# Patient Record
Sex: Female | Born: 1970 | Hispanic: No | Marital: Single | Smoking: Former smoker
Health system: Southern US, Community
[De-identification: ages and names within clinical notes are randomized; demographics above are authoritative.]

## PROBLEM LIST (undated history)

## (undated) DIAGNOSIS — E559 Vitamin D deficiency, unspecified: Secondary | ICD-10-CM

## (undated) DIAGNOSIS — F32A Depression, unspecified: Secondary | ICD-10-CM

## (undated) DIAGNOSIS — I10 Essential (primary) hypertension: Secondary | ICD-10-CM

## (undated) DIAGNOSIS — R232 Flushing: Secondary | ICD-10-CM

## (undated) DIAGNOSIS — G4700 Insomnia, unspecified: Secondary | ICD-10-CM

## (undated) DIAGNOSIS — G5600 Carpal tunnel syndrome, unspecified upper limb: Secondary | ICD-10-CM

## (undated) DIAGNOSIS — F329 Major depressive disorder, single episode, unspecified: Secondary | ICD-10-CM

## (undated) DIAGNOSIS — F419 Anxiety disorder, unspecified: Secondary | ICD-10-CM

## (undated) DIAGNOSIS — E119 Type 2 diabetes mellitus without complications: Secondary | ICD-10-CM

## (undated) HISTORY — DX: Depression, unspecified: F32.A

## (undated) HISTORY — DX: Major depressive disorder, single episode, unspecified: F32.9

## (undated) HISTORY — DX: Insomnia, unspecified: G47.00

## (undated) HISTORY — DX: Flushing: R23.2

## (undated) HISTORY — DX: Type 2 diabetes mellitus without complications: E11.9

## (undated) HISTORY — DX: Anxiety disorder, unspecified: F41.9

## (undated) HISTORY — DX: Carpal tunnel syndrome, unspecified upper limb: G56.00

---
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
## 1999-12-29 ENCOUNTER — Encounter: Payer: Self-pay | Admitting: *Deleted

## 1999-12-29 ENCOUNTER — Emergency Department (HOSPITAL_COMMUNITY): Admission: EM | Admit: 1999-12-29 | Discharge: 1999-12-29 | Payer: Self-pay | Admitting: *Deleted

## 2000-02-12 ENCOUNTER — Emergency Department (HOSPITAL_COMMUNITY): Admission: EM | Admit: 2000-02-12 | Discharge: 2000-02-12 | Payer: Self-pay | Admitting: Emergency Medicine

## 2000-08-18 ENCOUNTER — Encounter: Admission: RE | Admit: 2000-08-18 | Discharge: 2000-08-18 | Payer: Self-pay

## 2000-08-18 ENCOUNTER — Other Ambulatory Visit: Admission: RE | Admit: 2000-08-18 | Discharge: 2000-08-18 | Payer: Self-pay | Admitting: Gynecology

## 2000-12-22 ENCOUNTER — Inpatient Hospital Stay (HOSPITAL_COMMUNITY): Admission: AD | Admit: 2000-12-22 | Discharge: 2000-12-22 | Payer: Self-pay | Admitting: Obstetrics & Gynecology

## 2001-08-03 ENCOUNTER — Inpatient Hospital Stay (HOSPITAL_COMMUNITY): Admission: AD | Admit: 2001-08-03 | Discharge: 2001-08-03 | Payer: Self-pay | Admitting: *Deleted

## 2001-08-14 ENCOUNTER — Inpatient Hospital Stay: Admission: AD | Admit: 2001-08-14 | Discharge: 2001-08-14 | Payer: Self-pay | Admitting: *Deleted

## 2001-08-14 ENCOUNTER — Encounter: Payer: Self-pay | Admitting: *Deleted

## 2001-08-23 ENCOUNTER — Encounter: Admission: RE | Admit: 2001-08-23 | Discharge: 2001-08-23 | Payer: Self-pay | Admitting: Family Medicine

## 2001-10-09 ENCOUNTER — Encounter: Admission: RE | Admit: 2001-10-09 | Discharge: 2001-10-09 | Payer: Self-pay | Admitting: Family Medicine

## 2001-11-12 ENCOUNTER — Encounter: Admission: RE | Admit: 2001-11-12 | Discharge: 2001-11-12 | Payer: Self-pay | Admitting: Family Medicine

## 2001-11-14 ENCOUNTER — Ambulatory Visit (HOSPITAL_COMMUNITY): Admission: RE | Admit: 2001-11-14 | Discharge: 2001-11-14 | Payer: Self-pay | Admitting: *Deleted

## 2001-11-19 ENCOUNTER — Encounter: Admission: RE | Admit: 2001-11-19 | Discharge: 2001-11-19 | Payer: Self-pay | Admitting: Family Medicine

## 2001-11-29 ENCOUNTER — Encounter: Admission: RE | Admit: 2001-11-29 | Discharge: 2001-11-29 | Payer: Self-pay | Admitting: Family Medicine

## 2001-12-06 ENCOUNTER — Inpatient Hospital Stay (HOSPITAL_COMMUNITY): Admission: RE | Admit: 2001-12-06 | Discharge: 2001-12-06 | Payer: Self-pay | Admitting: Family Medicine

## 2001-12-08 ENCOUNTER — Inpatient Hospital Stay (HOSPITAL_COMMUNITY): Admission: AD | Admit: 2001-12-08 | Discharge: 2001-12-19 | Payer: Self-pay | Admitting: *Deleted

## 2001-12-11 ENCOUNTER — Encounter: Payer: Self-pay | Admitting: *Deleted

## 2001-12-27 ENCOUNTER — Encounter: Admission: RE | Admit: 2001-12-27 | Discharge: 2001-12-27 | Payer: Self-pay | Admitting: *Deleted

## 2002-01-10 ENCOUNTER — Encounter: Admission: RE | Admit: 2002-01-10 | Discharge: 2002-01-10 | Payer: Self-pay | Admitting: *Deleted

## 2002-01-17 ENCOUNTER — Encounter: Admission: RE | Admit: 2002-01-17 | Discharge: 2002-01-17 | Payer: Self-pay | Admitting: *Deleted

## 2002-01-24 ENCOUNTER — Encounter: Admission: RE | Admit: 2002-01-24 | Discharge: 2002-01-24 | Payer: Self-pay | Admitting: *Deleted

## 2002-01-31 ENCOUNTER — Encounter: Admission: RE | Admit: 2002-01-31 | Discharge: 2002-01-31 | Payer: Self-pay | Admitting: *Deleted

## 2002-02-07 ENCOUNTER — Encounter: Admission: RE | Admit: 2002-02-07 | Discharge: 2002-02-07 | Payer: Self-pay | Admitting: *Deleted

## 2002-02-12 ENCOUNTER — Ambulatory Visit (HOSPITAL_COMMUNITY): Admission: RE | Admit: 2002-02-12 | Discharge: 2002-02-12 | Payer: Self-pay | Admitting: *Deleted

## 2002-02-13 ENCOUNTER — Inpatient Hospital Stay (HOSPITAL_COMMUNITY): Admission: AD | Admit: 2002-02-13 | Discharge: 2002-02-13 | Payer: Self-pay | Admitting: Obstetrics and Gynecology

## 2002-02-14 ENCOUNTER — Encounter: Admission: RE | Admit: 2002-02-14 | Discharge: 2002-02-14 | Payer: Self-pay | Admitting: *Deleted

## 2002-02-16 ENCOUNTER — Inpatient Hospital Stay (HOSPITAL_COMMUNITY): Admission: AD | Admit: 2002-02-16 | Discharge: 2002-02-16 | Payer: Self-pay | Admitting: *Deleted

## 2002-02-21 ENCOUNTER — Encounter: Admission: RE | Admit: 2002-02-21 | Discharge: 2002-02-21 | Payer: Self-pay | Admitting: *Deleted

## 2002-02-22 ENCOUNTER — Encounter (HOSPITAL_COMMUNITY): Admission: RE | Admit: 2002-02-22 | Discharge: 2002-03-01 | Payer: Self-pay | Admitting: *Deleted

## 2002-03-06 ENCOUNTER — Encounter: Admission: RE | Admit: 2002-03-06 | Discharge: 2002-03-06 | Payer: Self-pay | Admitting: *Deleted

## 2002-03-07 ENCOUNTER — Inpatient Hospital Stay (HOSPITAL_COMMUNITY): Admission: AD | Admit: 2002-03-07 | Discharge: 2002-03-10 | Payer: Self-pay | Admitting: Obstetrics and Gynecology

## 2002-03-09 ENCOUNTER — Encounter (INDEPENDENT_AMBULATORY_CARE_PROVIDER_SITE_OTHER): Payer: Self-pay | Admitting: Specialist

## 2002-07-29 ENCOUNTER — Encounter (INDEPENDENT_AMBULATORY_CARE_PROVIDER_SITE_OTHER): Payer: Self-pay | Admitting: *Deleted

## 2002-07-29 LAB — CONVERTED CEMR LAB

## 2002-08-07 ENCOUNTER — Encounter: Admission: RE | Admit: 2002-08-07 | Discharge: 2002-08-07 | Payer: Self-pay | Admitting: Family Medicine

## 2006-01-26 ENCOUNTER — Emergency Department (HOSPITAL_COMMUNITY): Admission: EM | Admit: 2006-01-26 | Discharge: 2006-01-26 | Payer: Self-pay | Admitting: Emergency Medicine

## 2006-07-28 ENCOUNTER — Encounter (INDEPENDENT_AMBULATORY_CARE_PROVIDER_SITE_OTHER): Payer: Self-pay | Admitting: *Deleted

## 2006-10-13 ENCOUNTER — Emergency Department (HOSPITAL_COMMUNITY): Admission: EM | Admit: 2006-10-13 | Discharge: 2006-10-13 | Payer: Self-pay | Admitting: Emergency Medicine

## 2007-04-20 ENCOUNTER — Emergency Department (HOSPITAL_COMMUNITY): Admission: EM | Admit: 2007-04-20 | Discharge: 2007-04-20 | Payer: Self-pay | Admitting: Emergency Medicine

## 2007-12-15 ENCOUNTER — Emergency Department (HOSPITAL_COMMUNITY): Admission: EM | Admit: 2007-12-15 | Discharge: 2007-12-15 | Payer: Self-pay | Admitting: Emergency Medicine

## 2007-12-18 ENCOUNTER — Emergency Department (HOSPITAL_COMMUNITY): Admission: EM | Admit: 2007-12-18 | Discharge: 2007-12-18 | Payer: Self-pay | Admitting: Family Medicine

## 2008-08-18 ENCOUNTER — Ambulatory Visit: Payer: Self-pay | Admitting: Family Medicine

## 2008-08-19 ENCOUNTER — Other Ambulatory Visit: Admission: RE | Admit: 2008-08-19 | Discharge: 2008-08-19 | Payer: Self-pay | Admitting: Family Medicine

## 2008-08-19 ENCOUNTER — Ambulatory Visit: Payer: Self-pay | Admitting: Family Medicine

## 2008-08-19 ENCOUNTER — Encounter (INDEPENDENT_AMBULATORY_CARE_PROVIDER_SITE_OTHER): Payer: Self-pay | Admitting: Family Medicine

## 2008-08-19 LAB — CONVERTED CEMR LAB
Chlamydia, DNA Probe: NEGATIVE
GC Probe Amp, Genital: NEGATIVE

## 2008-08-20 ENCOUNTER — Ambulatory Visit: Payer: Self-pay | Admitting: Family Medicine

## 2008-08-20 DIAGNOSIS — F411 Generalized anxiety disorder: Secondary | ICD-10-CM | POA: Insufficient documentation

## 2008-08-20 LAB — CONVERTED CEMR LAB
Glucose, Urine, Semiquant: NEGATIVE
Ketones, urine, test strip: NEGATIVE
Specific Gravity, Urine: >=1.03
WBC Urine, dipstick: NEGATIVE
pH: 5.5

## 2008-08-22 ENCOUNTER — Encounter (INDEPENDENT_AMBULATORY_CARE_PROVIDER_SITE_OTHER): Payer: Self-pay | Admitting: Family Medicine

## 2008-08-25 ENCOUNTER — Encounter (INDEPENDENT_AMBULATORY_CARE_PROVIDER_SITE_OTHER): Payer: Self-pay | Admitting: Family Medicine

## 2008-08-25 ENCOUNTER — Ambulatory Visit: Payer: Self-pay | Admitting: Family Medicine

## 2008-08-25 LAB — CONVERTED CEMR LAB
ALT: 12 units/L (ref 0–35)
AST: 16 units/L (ref 0–37)
Albumin: 4 g/dL (ref 3.5–5.2)
Alkaline Phosphatase: 61 units/L (ref 39–117)
BUN: 10 mg/dL (ref 6–23)
CO2: 21 meq/L (ref 19–32)
Chloride: 107 meq/L (ref 96–112)
Creatinine, Ser: 0.78 mg/dL (ref 0.40–1.20)
Glucose, Bld: 116 mg/dL — ABNORMAL HIGH (ref 70–99)
HCT: 37.7 % (ref 36.0–46.0)
Hemoglobin: 12.8 g/dL (ref 12.0–15.0)
LDL Cholesterol: 102 mg/dL — ABNORMAL HIGH (ref 0–99)
MCV: 92.4 fL (ref 78.0–100.0)
Platelets: 256 10*3/uL (ref 150–400)
Total Bilirubin: 0.5 mg/dL (ref 0.3–1.2)
Total Protein: 6.9 g/dL (ref 6.0–8.3)
WBC: 5.6 10*3/uL (ref 4.0–10.5)

## 2008-08-29 ENCOUNTER — Encounter (INDEPENDENT_AMBULATORY_CARE_PROVIDER_SITE_OTHER): Payer: Self-pay | Admitting: Family Medicine

## 2008-12-24 ENCOUNTER — Telehealth: Payer: Self-pay | Admitting: Family Medicine

## 2008-12-26 ENCOUNTER — Telehealth: Payer: Self-pay | Admitting: *Deleted

## 2009-03-07 ENCOUNTER — Encounter (INDEPENDENT_AMBULATORY_CARE_PROVIDER_SITE_OTHER): Payer: Self-pay | Admitting: *Deleted

## 2009-03-07 DIAGNOSIS — F17200 Nicotine dependence, unspecified, uncomplicated: Secondary | ICD-10-CM

## 2009-08-10 ENCOUNTER — Telehealth: Payer: Self-pay | Admitting: *Deleted

## 2009-09-14 ENCOUNTER — Telehealth: Payer: Self-pay | Admitting: Family Medicine

## 2009-10-19 ENCOUNTER — Telehealth: Payer: Self-pay | Admitting: Family Medicine

## 2009-11-19 ENCOUNTER — Telehealth: Payer: Self-pay | Admitting: Family Medicine

## 2010-05-04 ENCOUNTER — Other Ambulatory Visit
Admission: RE | Admit: 2010-05-04 | Discharge: 2010-05-04 | Payer: Self-pay | Source: Home / Self Care | Admitting: Family Medicine

## 2010-05-04 ENCOUNTER — Encounter: Payer: Self-pay | Admitting: Family Medicine

## 2010-05-04 ENCOUNTER — Telehealth: Payer: Self-pay | Admitting: *Deleted

## 2010-05-04 ENCOUNTER — Ambulatory Visit: Payer: Self-pay | Admitting: Family Medicine

## 2010-05-04 LAB — CONVERTED CEMR LAB
ALT: 9 units/L (ref 0–35)
AST: 15 units/L (ref 0–37)
Albumin: 4.4 g/dL (ref 3.5–5.2)
Alkaline Phosphatase: 67 units/L (ref 39–117)
BUN: 10 mg/dL (ref 6–23)
Bilirubin Urine: NEGATIVE
CO2: 25 meq/L (ref 19–32)
Calcium: 9.4 mg/dL (ref 8.4–10.5)
Chlamydia, DNA Probe: NEGATIVE
Chloride: 105 meq/L (ref 96–112)
Cholesterol: 192 mg/dL (ref 0–200)
Creatinine, Ser: 0.7 mg/dL (ref 0.40–1.20)
GC Probe Amp, Genital: NEGATIVE
Glucose, Bld: 88 mg/dL (ref 70–99)
Glucose, Urine, Semiquant: NEGATIVE
HCT: 39.8 % (ref 36.0–46.0)
HDL: 66 mg/dL (ref >39)
HIV: NONREACTIVE
Hemoglobin: 13.1 g/dL (ref 12.0–15.0)
Ketones, urine, test strip: NEGATIVE
LDL Cholesterol: 108 mg/dL — ABNORMAL HIGH (ref 0–99)
MCHC: 32.9 g/dL (ref 30.0–36.0)
MCV: 93.6 fL (ref 78.0–100.0)
Nitrite: NEGATIVE
Pap Smear: NEGATIVE
Platelets: 283 10*3/uL (ref 150–400)
Potassium: 3.9 meq/L (ref 3.5–5.3)
Protein, U semiquant: NEGATIVE
RBC / HPF: >20
RBC: 4.25 M/uL (ref 3.87–5.11)
RDW: 14 % (ref 11.5–15.5)
Sodium: 139 meq/L (ref 135–145)
Specific Gravity, Urine: >=1.03
TSH: 0.848 microintl units/mL (ref 0.350–4.500)
Total Bilirubin: 0.4 mg/dL (ref 0.3–1.2)
Total CHOL/HDL Ratio: 2.9
Total Protein: 7.5 g/dL (ref 6.0–8.3)
Triglycerides: 91 mg/dL (ref <150)
Urobilinogen, UA: 0.2
VLDL: 18 mg/dL (ref 0–40)
WBC Urine, dipstick: NEGATIVE
WBC: 8.3 10*3/uL (ref 4.0–10.5)
Whiff Test: NEGATIVE
pH: 7

## 2010-05-05 ENCOUNTER — Encounter: Payer: Self-pay | Admitting: Family Medicine

## 2010-05-09 ENCOUNTER — Telehealth: Payer: Self-pay | Admitting: Family Medicine

## 2010-07-01 NOTE — Progress Notes (Signed)
Summary: Rx Req  Phone Note Refill Request Call back at Home Phone [phone] Message from:  Patient  Refills Requested: Medication #1:  PAXIL 10 MG TABS 1 tablet by mouth at bedtime [facility] [address]  Initial call taken by: Clydell Hakim,  August 10, 2009 4:43 PM  Follow-up for Phone Call        okay for 1 refill. needs appt. [facility] [address]. please send. Follow-up by: Helane Rima DO,  August 11, 2009 2:24 PM    Prescriptions: PAXIL 10 MG TABS (PAROXETINE HCL) 1 tablet by mouth at bedtime  #30 Tablet x 0   Entered by:   Tessie Fass CMA   Authorized by:   Helane Rima DO   Signed by:   Tessie Fass CMA on 08/11/2009   Method used:   [facility]        [phone][facility] (retail)       [address] [place], [place]  [address]       Ph: [phone]       Fax: [phone]   RxID:   [id number] PAXIL 10 MG TABS (PAROXETINE HCL) 1 tablet by mouth at bedtime  #30 Tablet x 1   Entered and Authorized by:   Helane Rima DO   Signed by:   Helane Rima DO on 08/11/2009   Method used:   Print then Give to Patient   RxID:   [id number]

## 2010-07-01 NOTE — Progress Notes (Signed)
Summary: re: RX/TS  Phone Note Refill Request Call back at [phone]   Refills Requested: Medication #1:  METRONIDAZOLE 500 MG TABS 1 tablet by mouth two times a day for 7 days. Pt did not receive hand written rx for this one nor was it Escribed to pharmcy.  [facility] [address]  Initial call taken by: Abundio Miu,  May 04, 2010 4:07 PM  Follow-up for Phone Call        Patient with NO infection on UA or wetprep. NO Rx written. The Flagyl was on her medication list from a previous Rx. I have now removed it from her medication list. NOTE: ALL of her other tests were also normal. Follow-up by: Helane Rima DO,  May 05, 2010 12:13 PM  Additional Follow-up for Phone Call Additional follow up Details #1::        called pt. informed of Dr.Wallace note. Additional Follow-up by: Arlyss Repress CMA,,  May 05, 2010 3:03 PM

## 2010-07-01 NOTE — Progress Notes (Signed)
Summary: Rx Req  Phone Note Refill Request Call back at Home Phone [phone] Message from:  Patient  Refills Requested: Medication #1:  PAXIL 10 MG TABS 1 tablet by mouth at bedtime NEEDS THEM RESENT IN DUE TO AT THE TIME UNABLE TO PAY FOR THEM.  Initial call taken by: Clydell Hakim,  November 19, 2009 9:49 AM  Follow-up for Phone Call        Needs appointment first. Has not been seen in over 1 year. Follow-up by: Helane Rima DO,  November 19, 2009 4:04 PM  Additional Follow-up for Phone Call Additional follow up Details #1::        called pt told her she needs appt for refills. she has upcoming appt on 12/16/09 with Earlene Plater, told her to keep that appt to get her Rx refilled. pt is requesting enough until appt. Additional Follow-up by: Tessie Fass CMA,  November 19, 2009 5:02 PM    Prescriptions: PAXIL 10 MG TABS (PAROXETINE HCL) 1 tablet by mouth at bedtime  #30 Tablet x 1   Entered and Authorized by:   Helane Rima DO   Signed by:   Helane Rima DO on 11/20/2009   Method used:   [facility]ronically to        [facility] [phone]* (retail)       [address] [place], [place]  [address]       Ph: [phone]       Fax: [phone]   RxID:   [id number]

## 2010-07-01 NOTE — Progress Notes (Signed)
Summary: Rx Req  Phone Note Refill Request Call back at Home Phone [phone] Message from:  Patient  Refills Requested: Medication #1:  PAXIL 10 MG TABS 1 tablet by mouth at bedtime [facility] [address].  Initial call taken by: Clydell Hakim,  December 24, 2008 1:56 PM  Follow-up for Phone Call        will forward message to MD. Follow-up by: Theresia Lo RN,  December 24, 2008 2:27 PM
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
## 2010-07-01 NOTE — Letter (Signed)
Summary: Generic Letter  [facility]  [address]   [place], [place] [address]   Phone: [phone]  Fax: [phone]    05/05/2010  GRACIEANN STANNARD [address] [address] [place], [place]  [address]  Dear Ms. Archibald,  I am happy to inform you that all of your recent labs were normal.  Sincerely,   Helane Rima DO  Appended Document: Generic Letter mailed

## 2010-07-01 NOTE — Progress Notes (Signed)
Summary: Rx Req  Phone Note Refill Request Call back at Home Phone [phone] Message from:  Patient  Refills Requested: Medication #1:  PAXIL 10 MG TABS 1 tablet by mouth at bedtime [facility] [address]  Initial call taken by: Clydell Hakim,  Oct 19, 2009 8:56 AM  Follow-up for Phone Call        must be seen before next refill Follow-up by: Helane Rima DO,  Oct 19, 2009 10:51 AM    Prescriptions: PAXIL 10 MG TABS (PAROXETINE HCL) 1 tablet by mouth at bedtime  #30 Tablet x 0   Entered and Authorized by:   Helane Rima DO   Signed by:   Helane Rima DO on 10/19/2009   Method used:   Electronically to        [facility] [address] (retail)       [phone]       [place], [place]  [address]       Ph: [phone]       Fax: [phone]   RxID:   [id number] PAXIL 10 MG TABS (PAROXETINE HCL) 1 tablet by mouth at bedtime  #30 Tablet x 0   Entered and Authorized by:   Helane Rima DO   Signed by:   Helane Rima DO on 10/19/2009   Method used:   [facility] [phone]* (retail)       [address] [place], [place]  [address]       Ph: [phone]       Fax: [phone]   RxID:   [id number]

## 2010-07-01 NOTE — Progress Notes (Signed)
Summary: UPDATE PROBLEM LIST   

## 2010-07-01 NOTE — Progress Notes (Signed)
Summary: resch  Phone Note Call from Patient   Caller: Patient Summary of Call: pt had to resch due to no transportation Initial call taken by: De Nurse,  September 14, 2009 9:01 AM

## 2010-07-05 ENCOUNTER — Encounter: Payer: Self-pay | Admitting: *Deleted
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
## 2010-10-15 NOTE — Op Note (Signed)
   [name], Theresa Olson                            ACCOUNT NO.:  [id number]   MEDICAL RECORD NO.:  [id number]                   PATIENT TYPE:  INP   LOCATION:  9139                                 FACILITY:  [facility]   PHYSICIAN:  Conni Elliot, M.D.             DATE OF BIRTH:  December 22, 1970   DATE OF PROCEDURE:  03/09/2002  DATE OF DISCHARGE:                                 OPERATIVE REPORT   PREOPERATIVE DIAGNOSIS:  Desire for surgical sterilization.   POSTOPERATIVE DIAGNOSIS:  Desire for surgical sterilization.   PROCEDURE:  Modified bilateral Pomeroy tubal ligation.   SURGEON:  Conni Elliot, M.D.   ANESTHESIA:  General endotracheal.   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   DESCRIPTION OF PROCEDURE:  After placing the patient under general  endotracheal anesthesia, the patient was prepped and draped in the sterile  fashion.  Periumbilical incision was made and incision was made in the skin  and subcutaneous fashion.  Peritoneal cavity entered.  The left fallopian  was identified and grasped with Babcock clamp and followed to the  fimbriated.  A single-tooth tenaculum was brought into the operative field,  double suture ligated and approximately 1.5-2.0 cm segment excised.  Hemostasis was adequate.  The same procedure was done on the opposite side.  Hemostasis was again adequate.  The anterior peritoneum, fascia and  subsequently the skin were then closed in a routine fashion.  Estimated  blood loss was less than 10 cc.                                               Conni Elliot, M.D.    ASG/MEDQ  D:  03/09/2002  T:  03/10/2002  Job:  [id number]

## 2011-02-25 LAB — CULTURE, ROUTINE-ABSCESS

## 2011-03-08 LAB — URINALYSIS, ROUTINE W REFLEX MICROSCOPIC
Bilirubin Urine: NEGATIVE
Glucose, UA: NEGATIVE
Ketones, ur: NEGATIVE
Nitrite: NEGATIVE
Protein, ur: NEGATIVE
Specific Gravity, Urine: 1.029
Urobilinogen, UA: 0.2
pH: 5.5

## 2011-03-08 LAB — RPR: RPR Ser Ql: NONREACTIVE

## 2011-03-08 LAB — GC/CHLAMYDIA PROBE AMP, GENITAL
Chlamydia, DNA Probe: NEGATIVE
GC Probe Amp, Genital: NEGATIVE

## 2011-03-08 LAB — WET PREP, GENITAL
Trich, Wet Prep: NONE SEEN
Yeast Wet Prep HPF POC: NONE SEEN

## 2011-03-08 LAB — PREGNANCY, URINE: Preg Test, Ur: NEGATIVE

## 2011-03-08 LAB — URINE MICROSCOPIC-ADD ON

## 2011-11-02 ENCOUNTER — Encounter: Payer: Self-pay | Admitting: Family Medicine

## 2011-11-02 ENCOUNTER — Ambulatory Visit (INDEPENDENT_AMBULATORY_CARE_PROVIDER_SITE_OTHER): Payer: Medicaid Other | Admitting: Family Medicine

## 2011-11-02 VITALS — BP 133/83 | HR 80 | Ht 61.75 in | Wt 172.0 lb

## 2011-11-02 DIAGNOSIS — F332 Major depressive disorder, recurrent severe without psychotic features: Secondary | ICD-10-CM | POA: Insufficient documentation

## 2011-11-02 LAB — TSH: TSH: 0.59 u[IU]/mL (ref 0.350–4.500)

## 2011-11-02 MED ORDER — SERTRALINE HCL 25 MG PO TABS: 25.0000 mg | ORAL_TABLET | Freq: Every day | ORAL | Status: DC

## 2011-11-02 NOTE — Patient Instructions (Signed)
Thank you for coming in today. Start taking the zoloft tonight.  Please come back in 1 week.  Call 911 or go to the ER if you feel like hurting yourself or others.  We will get you feeling better.

## 2011-11-03 ENCOUNTER — Encounter: Payer: Self-pay | Admitting: Family Medicine

## 2011-11-03 NOTE — Assessment & Plan Note (Signed)
Patient has severe major depression.  I am somewhat concerned about the possibility of bipolar depression and she has a pertinent family history. However her symptoms and mood disorder questionnaire would indicate unipolar depression. Plan to start Zoloft 25 mg as she has coexisting anxiety.  Plan to followup in one week and increase to 50 mg. Patient agreed to contract for safety. Suicidal precautions reviewed

## 2011-11-03 NOTE — Progress Notes (Signed)
Theresa Olson is a 41 y.o. female who presents to [facility] today for depression.  Patient has a long history of major depression. She had been on Paxil in the past. In the intervening years since her last visit she had stopped taking Paxil and her depression has returned.  Her PHQ 9 today is 25.  She has a passive death wish but no plan.  She has no access to guns.    Patient notes significantly bothersome anhedonia.  Review of family history shows a sister and her sister's children with bipolar.  Patient denies any personal diagnosis of bipolar.  Mood disorder questionnaire is 8   PMH: Reviewed significant for unipolar depression History  Substance Use Topics  . Smoking status: Current Some Day Smoker    Types: Cigarettes  . Smokeless tobacco: Not on file  . Alcohol Use: Not on file   ROS as above  Medications reviewed. Current Outpatient Prescriptions  Medication Sig Dispense Refill  . sertraline (ZOLOFT) 25 MG tablet Take 1 tablet (25 mg total) by mouth daily.  30 tablet  0    Exam:  BP 133/83  Pulse 80  Ht 5' 1.75" (1.568 m)  Wt 172 lb (78.019 kg)  BMI 31.71 kg/m2 Gen: Well NAD PSYCH: Alert and oriented. Normal eye contact. Tearful at times. Flattened affect.  Normal speech and concentration. No SI/HI hallucinations or delusions expressed  Results for orders placed in visit on 11/02/11 (from the past 72 hour(s))  TSH     Status: Normal   Collection Time   11/02/11  4:49 PM      Component Value Range Comment   TSH 0.590  0.350 - 4.500 (uIU/mL)

## 2011-11-09 ENCOUNTER — Ambulatory Visit: Payer: Medicaid Other | Admitting: Family Medicine

## 2011-11-15 ENCOUNTER — Ambulatory Visit: Payer: Medicaid Other | Admitting: Family Medicine

## 2011-12-05 ENCOUNTER — Telehealth: Payer: Self-pay | Admitting: Family Medicine

## 2011-12-05 NOTE — Telephone Encounter (Signed)
Patient is calling for a refill on Zoloft to be sent to [facility] on [address].

## 2011-12-06 ENCOUNTER — Other Ambulatory Visit: Payer: Self-pay | Admitting: Family Medicine

## 2011-12-06 DIAGNOSIS — F332 Major depressive disorder, recurrent severe without psychotic features: Secondary | ICD-10-CM

## 2011-12-06 MED ORDER — SERTRALINE HCL 25 MG PO TABS: 25.0000 mg | ORAL_TABLET | Freq: Every day | ORAL | Status: DC

## 2011-12-06 NOTE — Telephone Encounter (Signed)
Refilled zoloft 25mg  daily and sent to [facility] pharmacy

## 2012-04-23 ENCOUNTER — Telehealth: Payer: Self-pay | Admitting: Family Medicine

## 2012-04-23 NOTE — Telephone Encounter (Signed)
Called patient about her thinking she is pregnant while on paxil. She reports that she actually has not been taking any medicine for her depression. She was prescribed zoloft at her last appointment but has not been taking it recently. She is not taking paxil either. She also reports that she has been having bleeding that feels like a period and that her last period was 2-3 weeks ago. She states that she doesn't have regular periods and that it is not unusual for her to have a period within 2-3 weeks of each other. The reason she thought she might be pregnant was because she thought her belly looked a little bigger.   I told her that the likelihood of her being pregnant is low since this sounds like her regular period pattern, however it cannot be completely excluded. Recommended for her to take a home pregnancy test and if it is positive for her to go to the clinic tomorrow to be evaluated for bleeding in early pregnancy. If it isn't positive, we will confirm that she is indeed not pregnant at her visit on Monday.  In addition, I recommended that it would be safe to start back zoloft but no paxil if there is a possibility that she might be pregnant.   Patient expressed understanding and agreed to this.

## 2012-04-23 NOTE — Telephone Encounter (Signed)
Patient has an appt on 04/30/12 (Monday).  Started having bleeding yesterday.  Not using birth control and has had unprotected intercourse.  LMP was possibly 2-3 weeks ago.  Not sure if she is pregnant.  Has not had home pregnancy test.  Wants to know if she should continue on Paxil until her appt on Monday.  Will route note to Dr. Gwenlyn Saran for advice and call patient back.  Gaylene Brooks, RN

## 2012-04-23 NOTE — Telephone Encounter (Signed)
Patient doesn't know if she should continue to take Paxil if she might be pregnant.  She is scheduled to be seen next week.

## 2012-04-30 ENCOUNTER — Encounter: Payer: Medicaid Other | Admitting: Family Medicine

## 2012-09-21 ENCOUNTER — Ambulatory Visit: Payer: Medicaid Other | Admitting: Family Medicine

## 2013-05-15 ENCOUNTER — Emergency Department (HOSPITAL_COMMUNITY)
Admission: EM | Admit: 2013-05-15 | Discharge: 2013-05-15 | Disposition: A | Discharge status: Home / Self Care | Payer: No Typology Code available for payment source | Attending: Emergency Medicine | Admitting: Emergency Medicine

## 2013-05-15 ENCOUNTER — Emergency Department (HOSPITAL_COMMUNITY): Payer: No Typology Code available for payment source

## 2013-05-15 ENCOUNTER — Encounter (HOSPITAL_COMMUNITY): Payer: Self-pay | Admitting: Emergency Medicine

## 2013-05-15 DIAGNOSIS — F419 Anxiety disorder, unspecified: Secondary | ICD-10-CM

## 2013-05-15 DIAGNOSIS — S335XXA Sprain of ligaments of lumbar spine, initial encounter: Secondary | ICD-10-CM | POA: Insufficient documentation

## 2013-05-15 DIAGNOSIS — S39012A Strain of muscle, fascia and tendon of lower back, initial encounter: Secondary | ICD-10-CM

## 2013-05-15 DIAGNOSIS — I10 Essential (primary) hypertension: Secondary | ICD-10-CM | POA: Insufficient documentation

## 2013-05-15 DIAGNOSIS — Y9389 Activity, other specified: Secondary | ICD-10-CM | POA: Insufficient documentation

## 2013-05-15 DIAGNOSIS — F411 Generalized anxiety disorder: Secondary | ICD-10-CM | POA: Insufficient documentation

## 2013-05-15 DIAGNOSIS — F17200 Nicotine dependence, unspecified, uncomplicated: Secondary | ICD-10-CM | POA: Insufficient documentation

## 2013-05-15 DIAGNOSIS — R000 Tachycardia, unspecified: Secondary | ICD-10-CM | POA: Insufficient documentation

## 2013-05-15 DIAGNOSIS — S0993XA Unspecified injury of face, initial encounter: Secondary | ICD-10-CM | POA: Insufficient documentation

## 2013-05-15 DIAGNOSIS — Z79899 Other long term (current) drug therapy: Secondary | ICD-10-CM | POA: Insufficient documentation

## 2013-05-15 DIAGNOSIS — Y92410 Unspecified street and highway as the place of occurrence of the external cause: Secondary | ICD-10-CM | POA: Insufficient documentation

## 2013-05-15 HISTORY — DX: Essential (primary) hypertension: I10

## 2013-05-15 MED ORDER — NAPROXEN 500 MG PO TABS: 500.0000 mg | ORAL_TABLET | Freq: Two times a day (BID) | ORAL | Status: DC | PRN

## 2013-05-15 MED ORDER — IBUPROFEN 800 MG PO TABS
800.0000 mg | ORAL_TABLET | Freq: Once | ORAL | Status: AC
  Administered 2013-05-15: 800 mg via ORAL
  Filled 2013-05-15: qty 1

## 2013-05-15 MED ORDER — METHOCARBAMOL 750 MG PO TABS: 750.0000 mg | ORAL_TABLET | Freq: Four times a day (QID) | ORAL | Status: DC | PRN

## 2013-05-15 MED ORDER — HYDROCODONE-ACETAMINOPHEN 5-325 MG PO TABS: 1.0000 | ORAL_TABLET | Freq: Four times a day (QID) | ORAL | Status: DC | PRN

## 2013-05-15 MED ORDER — DIAZEPAM 5 MG PO TABS
10.0000 mg | ORAL_TABLET | Freq: Once | ORAL | Status: AC
  Administered 2013-05-15: 10 mg via ORAL
  Filled 2013-05-15: qty 2

## 2013-05-15 MED ORDER — HYDROCODONE-ACETAMINOPHEN 5-325 MG PO TABS
1.0000 | ORAL_TABLET | Freq: Once | ORAL | Status: AC
  Administered 2013-05-15: 1 via ORAL
  Filled 2013-05-15: qty 1

## 2013-05-15 MED ORDER — LORAZEPAM 1 MG PO TABS
1.0000 mg | ORAL_TABLET | Freq: Once | ORAL | Status: AC
  Administered 2013-05-15: 1 mg via ORAL
  Filled 2013-05-15: qty 1
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
## 2013-05-17 NOTE — ED Provider Notes (Signed)
Medical screening examination/treatment/procedure(s) were performed by non-physician practitioner and as supervising physician I was immediately available for consultation/collaboration.  EKG Interpretation   None         Audree Camel, MD 05/17/13 [phone]

## 2013-06-04 ENCOUNTER — Ambulatory Visit (INDEPENDENT_AMBULATORY_CARE_PROVIDER_SITE_OTHER): Payer: Medicaid Other | Admitting: Family Medicine

## 2013-06-04 ENCOUNTER — Other Ambulatory Visit (HOSPITAL_COMMUNITY)
Admission: RE | Admit: 2013-06-04 | Discharge: 2013-06-04 | Disposition: A | Discharge status: Home / Self Care | Payer: Medicaid Other | Source: Ambulatory Visit | Attending: Family Medicine | Admitting: Family Medicine

## 2013-06-04 ENCOUNTER — Encounter: Payer: Self-pay | Admitting: Family Medicine

## 2013-06-04 VITALS — BP 128/88 | HR 89 | Temp 99.1°F | Ht 62.0 in | Wt 171.0 lb

## 2013-06-04 DIAGNOSIS — F332 Major depressive disorder, recurrent severe without psychotic features: Secondary | ICD-10-CM

## 2013-06-04 DIAGNOSIS — R300 Dysuria: Secondary | ICD-10-CM

## 2013-06-04 DIAGNOSIS — N760 Acute vaginitis: Secondary | ICD-10-CM

## 2013-06-04 DIAGNOSIS — Z113 Encounter for screening for infections with a predominantly sexual mode of transmission: Secondary | ICD-10-CM | POA: Insufficient documentation

## 2013-06-04 DIAGNOSIS — Z1151 Encounter for screening for human papillomavirus (HPV): Secondary | ICD-10-CM | POA: Insufficient documentation

## 2013-06-04 DIAGNOSIS — Z124 Encounter for screening for malignant neoplasm of cervix: Secondary | ICD-10-CM

## 2013-06-04 DIAGNOSIS — Z01419 Encounter for gynecological examination (general) (routine) without abnormal findings: Secondary | ICD-10-CM | POA: Insufficient documentation

## 2013-06-04 LAB — POCT URINALYSIS DIPSTICK
BILIRUBIN UA: NEGATIVE
GLUCOSE UA: NEGATIVE
Ketones, UA: NEGATIVE
NITRITE UA: NEGATIVE
PH UA: 6
Protein, UA: 30
Spec Grav, UA: >=1.03
Urobilinogen, UA: 0.2

## 2013-06-04 LAB — POCT UA - MICROSCOPIC ONLY

## 2013-06-04 LAB — POCT WET PREP (WET MOUNT)
Clue Cells Wet Prep Whiff POC: POSITIVE
WBC, Wet Prep HPF POC: >20

## 2013-06-04 LAB — POCT URINE PREGNANCY: Preg Test, Ur: NEGATIVE

## 2013-06-04 MED ORDER — PAROXETINE HCL 20 MG PO TABS: 20.0000 mg | ORAL_TABLET | Freq: Every morning | ORAL | Status: DC

## 2013-06-04 MED ORDER — METRONIDAZOLE 500 MG PO TABS: 500.0000 mg | ORAL_TABLET | Freq: Two times a day (BID) | ORAL | Status: DC
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
## 2013-06-04 NOTE — Patient Instructions (Addendum)
We are going to start you back on the Paxil 20 mg daily. I would like to see you back in 2 weeks. I also recommend that you call her psychologist Dr. Pascal LuxKane to discuss your anxiety and depression further. I recommend that you meet with our psychologist, Dr. Spero GeraldsMichelle Kane, for help dealing with your depression and anxiety.  You can schedule an appointment with her by calling her directly at [phone].  I will let you know of the results if they are abnormal.  Please return to the clinic for a lab appointment only. You can make the appointment by calling a couple of days prior to the day you would like to get your labs drawn.  Please come fasting for the labwork: no food or drink other than water after midnight. We will check your thyroid, your cholesterol, and your complete metabolic panel.  We are going to treat the vaginal infection with a medicine called Flagyl

## 2013-06-05 ENCOUNTER — Encounter: Payer: Self-pay | Admitting: Family Medicine

## 2013-06-05 DIAGNOSIS — N760 Acute vaginitis: Secondary | ICD-10-CM | POA: Insufficient documentation

## 2013-06-05 NOTE — Assessment & Plan Note (Signed)
Long standing history of depression. Was on paxil in the past which did seem to help. She seems to have been on 20mg  at the time. There is a note from her previous doctor Dr. Denyse Amassorey mentioning possibility of bipolar. Since patient tolerated paxil well in the past, would like to try it and see if this helps.  At next visit, will give her a MDQ to fill out.  - refill paxil 20mg   - gave number for Dr. Pascal LuxKane. Patient is interested in this - no SI/HI at this time - substance abuse is a factor which would be nice to be addressed with Dr. Pascal LuxKane - follow up with me in 10 days to 2 weeks

## 2013-06-05 NOTE — Assessment & Plan Note (Signed)
Associated with abdominal pain.  Wet prep obtained. UA showing evidence of BV and trich.  - treat BV and trich with flagyl. If patient doesn't tolerate due to nausea, advised her to call and I will prescribe zofran - GC/Chl done - PAP smear done since she was due for one - UA non conclusive and sent to culture

## 2013-06-06 ENCOUNTER — Telehealth: Payer: Self-pay | Admitting: Family Medicine

## 2013-06-06 ENCOUNTER — Encounter: Payer: Self-pay | Admitting: Family Medicine

## 2013-06-06 LAB — URINE CULTURE
COLONY COUNT: NO GROWTH
ORGANISM ID, BACTERIA: NO GROWTH

## 2013-06-06 NOTE — Telephone Encounter (Signed)
Called pt. Informed. .Theresa Olson  

## 2013-06-06 NOTE — Telephone Encounter (Signed)
Please let patient know that her urine culture did not show any urinary tract infection. Thank you!  Marena ChancyStephanie Maryalyce Sanjuan, PGY-3 Family Medicine Resident

## 2013-06-10 ENCOUNTER — Other Ambulatory Visit: Payer: Medicaid Other

## 2013-06-18 ENCOUNTER — Ambulatory Visit: Payer: Medicaid Other | Admitting: Family Medicine

## 2014-02-17 ENCOUNTER — Other Ambulatory Visit (HOSPITAL_COMMUNITY)
Admission: RE | Admit: 2014-02-17 | Discharge: 2014-02-17 | Disposition: A | Discharge status: Home / Self Care | Payer: Medicaid Other | Source: Ambulatory Visit | Attending: Family Medicine | Admitting: Family Medicine

## 2014-02-17 ENCOUNTER — Ambulatory Visit (INDEPENDENT_AMBULATORY_CARE_PROVIDER_SITE_OTHER): Payer: Medicaid Other | Admitting: Family Medicine

## 2014-02-17 ENCOUNTER — Encounter: Payer: Self-pay | Admitting: Family Medicine

## 2014-02-17 VITALS — BP 170/80 | HR 93 | Temp 98.7°F | Ht 62.0 in | Wt 169.6 lb

## 2014-02-17 DIAGNOSIS — Z113 Encounter for screening for infections with a predominantly sexual mode of transmission: Secondary | ICD-10-CM | POA: Diagnosis not present

## 2014-02-17 DIAGNOSIS — Z23 Encounter for immunization: Secondary | ICD-10-CM

## 2014-02-17 DIAGNOSIS — F17200 Nicotine dependence, unspecified, uncomplicated: Secondary | ICD-10-CM

## 2014-02-17 DIAGNOSIS — F411 Generalized anxiety disorder: Secondary | ICD-10-CM

## 2014-02-17 DIAGNOSIS — N898 Other specified noninflammatory disorders of vagina: Secondary | ICD-10-CM

## 2014-02-17 DIAGNOSIS — N760 Acute vaginitis: Secondary | ICD-10-CM

## 2014-02-17 LAB — POCT WET PREP (WET MOUNT): Clue Cells Wet Prep Whiff POC: POSITIVE

## 2014-02-17 MED ORDER — METRONIDAZOLE 500 MG PO TABS: 500.0000 mg | ORAL_TABLET | Freq: Two times a day (BID) | ORAL | Status: DC

## 2014-02-17 MED ORDER — PAROXETINE HCL 20 MG PO TABS: 20.0000 mg | ORAL_TABLET | Freq: Every morning | ORAL | Status: DC

## 2014-02-17 NOTE — Assessment & Plan Note (Signed)
Patient currently smokes 4-5 cigs/day. States she feels she smokes because of her anxiety. Will attempt to control anxiety. If she cannot quit once her anxiety has improved, we will proceed with alternatives such as nicotine replacement and/or Wellbutrin (as it may also help with depression).

## 2014-02-17 NOTE — Patient Instructions (Addendum)
It was nice meeting you. The prescription for flagyl and paxil have been sent to your pharmacy  I will call you with results Please follow up with me in 1 month  Bacterial Vaginosis Bacterial vaginosis is a vaginal infection that occurs when the normal balance of bacteria in the vagina is disrupted. It results from an overgrowth of certain bacteria. This is the most common vaginal infection in women of childbearing age. Treatment is important to prevent complications, especially in pregnant women, as it can cause a premature delivery. CAUSES  Bacterial vaginosis is caused by an increase in harmful bacteria that are normally present in smaller amounts in the vagina. Several different kinds of bacteria can cause bacterial vaginosis. However, the reason that the condition develops is not fully understood. RISK FACTORS Certain activities or behaviors can put you at an increased risk of developing bacterial vaginosis, including:  Having a new sex partner or multiple sex partners.  Douching.  Using an intrauterine device (IUD) for contraception. Women do not get bacterial vaginosis from toilet seats, bedding, swimming pools, or contact with objects around them. SIGNS AND SYMPTOMS  Some women with bacterial vaginosis have no signs or symptoms. Common symptoms include:  Grey vaginal discharge.  A fishlike odor with discharge, especially after sexual intercourse.  Itching or burning of the vagina and vulva.  Burning or pain with urination. DIAGNOSIS  Your health care provider will take a medical history and examine the vagina for signs of bacterial vaginosis. A sample of vaginal fluid may be taken. Your health care provider will look at this sample under a microscope to check for bacteria and abnormal cells. A vaginal pH test may also be done.  TREATMENT  Bacterial vaginosis may be treated with antibiotic medicines. These may be given in the form of a pill or a vaginal cream. A second round of  antibiotics may be prescribed if the condition comes back after treatment.  HOME CARE INSTRUCTIONS   Only take over-the-counter or prescription medicines as directed by your health care provider.  If antibiotic medicine was prescribed, take it as directed. Make sure you finish it even if you start to feel better.  Do not have sex until treatment is completed.  Tell all sexual partners that you have a vaginal infection. They should see their health care provider and be treated if they have problems, such as a mild rash or itching.  Practice safe sex by using condoms and only having one sex partner. SEEK MEDICAL CARE IF:   Your symptoms are not improving after 3 days of treatment.  You have increased discharge or pain.  You have a fever. MAKE SURE YOU:   Understand these instructions.  Will watch your condition.  Will get help right away if you are not doing well or get worse. FOR MORE INFORMATION  Centers for Disease Control and Prevention, Division of STD Prevention: [employer]za American Sexual Health Association (ASHA): www.ashastd.org  Document Released: 05/16/2005 Document Revised: 03/06/2013 Document Reviewed: 12/26/2012 [facility] Patient Information 2015 [place], [place]. This information is not intended to replace advice given to you by your health care provider. Make sure you discuss any questions you have with your health care provider.

## 2014-02-17 NOTE — Assessment & Plan Note (Signed)
Wet prep positive for clue cells. This may be causing the patient's pelvic pain. Given h/o unprotected sex with multiple partners, patient would benefit from additional screening.  - Metronidazole  BID x 7 days: discussed disulfiram reaction with patient  - GC/Chlamydia pending  - HIV, RPR, hepatitis panel ordered and pending

## 2014-02-17 NOTE — Assessment & Plan Note (Signed)
From reviewing EMR, patient's GAD score and PHQ-9 scores did not change much while on this medication, but she felt she did much better. No SI/HI.   -Paxil  QD -Discussed meeting with Dr. Pascal Lux at mood disorder clinic - Will f/u in 1 month to assess change.

## 2014-02-17 NOTE — Progress Notes (Signed)
Patient ID: Theresa Olson, female   DOB: 26-Jul-1970, 43 y.o.   MRN: [id number] [facility]  Joanna Puff, MD  Subjective:  Anxiety/Depression: Used to be on Paxil  daily, however did not follow up so she hasn't been taking anything. Patient very anxious all the time. Worries about everything. Difficulty falling asleep and staying asleep. Hyperphagia. Endorses feelings of hopelessness. No recent stressors. No SI/HI or hallucinations.  Did feel Paxil helped in the past.  GAD 21, extremely difficult  PHQ-9: 25  Vaginal discharge: Has noted it for the last 3 months.Has had some dark brown discharge- she does not feel this is old blood after a menses. Also endorses white discharge on occasion which is abnormal for her. Discharge does not have a foul odor. +Suprapubic sharp pains. No dysuria, urinary urgency, or urinary frequency. No vaginal pruritus. Had +trichomonas and BV in 05/2013 and states she was treated. No sexual intercourse with that partner since then but notes she's had unprotected intercourse with 2 other men since that time. Has an irregular menstrual cycle. Denies excessive vaginal bleeding.  ROS- No chest pain, SOB, headaches, blurred vision.   Past Medical History Patient Active Problem List   Diagnosis Date Noted  . Vaginitis and vulvovaginitis 06/05/2013  . Recurrent major depression-severe 11/02/2011  . TOBACCO USER 03/07/2009  . ANXIETY DISORDER, GENERALIZED 08/20/2008    Medications- reviewed and updated Current Outpatient Prescriptions  Medication Sig Dispense Refill  . metroNIDAZOLE (FLAGYL) 500 MG tablet Take 1 tablet (500 mg total) by mouth 2 (two) times daily.  14 tablet  0  . vitamin C (ASCORBIC ACID) 500 MG tablet Take 500 mg by mouth every morning.       No current facility-administered medications for this visit.    Objective: BP 170/80  Pulse 93  Temp(Src) 98.7 F (37.1 C) (Oral)  Ht  (1.575 m)  Wt 169 lb 9.6 oz (76.93 kg)   BMI 31.01 kg/m2 Rechecked BP: 146/76 Gen: No acute distress. Alert, cooperative with exam. Appears anxious. Poor eye contact and frequently fidgeting HEENT: Atraumatic,Oropharynx clear. MMM CV: RRR. No murmurs, rubs, or gallops noted. 2+ radial and DP pulses bilaterally. Resp: CTAB. No wheezing, crackles, or rhonchi noted. Abd: +BS. Soft, non-distended, non-tender. Specifically, no pain with palpation over the suprapubic area Speculum exam: No irritation of the vulva. Moderate amount of thin white vaginal discharge, malodorous in nature. No cervical motion tenderness.  Ext: No edema. No gross deformities. Neuro: Alert and oriented, No gross focal deficits   Assessment/Plan:  Vaginitis and vulvovaginitis Wet prep positive for clue cells. This may be causing the patient's pelvic pain. Given h/o unprotected sex with multiple partners, patient would benefit from additional screening.  - Metronidazole  BID x 7 days: discussed disulfiram reaction with patient  - GC/Chlamydia pending  - HIV, RPR, hepatitis panel ordered and pending   ANXIETY DISORDER, GENERALIZED From reviewing EMR, patient's GAD score and PHQ-9 scores did not change much while on this medication, but she felt she did much better. No SI/HI.   -Paxil  QD -Discussed meeting with Dr. Pascal Lux at mood disorder clinic - Will f/u in 1 month to assess change.  TOBACCO USER Patient currently smokes 4-5 cigs/day. States she feels she smokes because of her anxiety. Will attempt to control anxiety. If she cannot quit once her anxiety has improved, we will proceed with alternatives such as nicotine replacement and/or Wellbutrin (as it may also help with depression).     Orders  Placed This Encounter  Procedures  . HIV antibody (with reflex)  . RPR  . Hepatitis panel, acute  . POCT Wet Prep [facility])    Meds ordered this encounter  Medications  . metroNIDAZOLE (FLAGYL) 500 MG tablet    Sig: Take 1 tablet (500 mg total)  by mouth 2 (two) times daily.    Dispense:  14 tablet    Refill:  0  . DISCONTD: PARoxetine (PAXIL) 20 MG tablet    Sig: Take 1 tablet (20 mg total) by mouth every morning.    Dispense:  30 tablet    Refill:  1

## 2014-02-18 LAB — CERVICOVAGINAL ANCILLARY ONLY
Chlamydia: NEGATIVE
NEISSERIA GONORRHEA: NEGATIVE

## 2014-02-18 LAB — RPR

## 2014-02-18 LAB — HEPATITIS PANEL, ACUTE
HCV AB: NEGATIVE
Hep A IgM: NONREACTIVE
Hep B C IgM: NONREACTIVE
Hepatitis B Surface Ag: NEGATIVE

## 2014-02-18 LAB — HIV ANTIBODY (ROUTINE TESTING W REFLEX): HIV: NONREACTIVE

## 2014-02-20 ENCOUNTER — Telehealth: Payer: Self-pay | Admitting: Family Medicine

## 2014-02-20 NOTE — Telephone Encounter (Signed)
Will you please call and let the patient know that all of her labs were normal. The home phone number that I tried (after work hours) states it is not in service.   Thank you, Rodrigo Ran

## 2014-02-21 ENCOUNTER — Encounter: Payer: Self-pay | Admitting: *Deleted

## 2014-02-21 NOTE — Telephone Encounter (Signed)
Letter sent to patient address.

## 2014-03-11 ENCOUNTER — Telehealth: Payer: Self-pay | Admitting: Family Medicine

## 2014-03-11 NOTE — Telephone Encounter (Signed)
Refill request for naproxen. Pls call once completed.

## 2014-03-12 MED ORDER — NAPROXEN 500 MG PO TABS: 500.0000 mg | ORAL_TABLET | Freq: Two times a day (BID) | ORAL | Status: AC | PRN

## 2014-03-12 NOTE — Telephone Encounter (Signed)
Naproxen Rx refilled. If patient continues to have pain, please have her make an appointment with me.  Thanks, Joanna Puffrystal S. Dorsey, MD [facility] Resident  03/12/2014, 4:14 PM

## 2014-03-13 NOTE — Telephone Encounter (Signed)
Tried calling patient, number currently disconnected.

## 2014-03-13 NOTE — Telephone Encounter (Signed)
PT also requesting Paxil to be refilled.

## 2014-03-14 MED ORDER — PAROXETINE HCL 20 MG PO TABS: 20.0000 mg | ORAL_TABLET | Freq: Every day | ORAL | Status: DC

## 2014-03-14 NOTE — Telephone Encounter (Signed)
Rx refilled. Please have pt make a f/u appt with me.  Thanks, Joanna Puffrystal S. Dorsey, MD [facility] Res[facility]  03/14/2014, 3:18 PM

## 2014-03-14 NOTE — Addendum Note (Signed)
Addended by: Joanna PuffRSEY, CRYSTAL S on: 03/14/2014 03:18 PM   Modules accepted: Orders

## 2014-03-18 NOTE — Telephone Encounter (Signed)
Left message asking that patient call back to scheduled follow up.

## 2014-04-16 ENCOUNTER — Other Ambulatory Visit: Payer: Self-pay | Admitting: Family Medicine

## 2014-04-16 MED ORDER — PAROXETINE HCL 20 MG PO TABS: 20.0000 mg | ORAL_TABLET | Freq: Every day | ORAL | Status: DC

## 2014-04-16 NOTE — Telephone Encounter (Signed)
Pt called and needs a refill on her Paxil called in. jw

## 2014-04-16 NOTE — Telephone Encounter (Signed)
Please let the patient know that her Rx was refilled. Please have her follow up with me within the next month so we can assess its effectiveness.  Thanks, Joanna Puffrystal S. Dorsey, MD [facility] Res[facility]  04/16/2014, 9:24 PM

## 2014-04-29 NOTE — Telephone Encounter (Signed)
Phone number not currently in service.

## 2014-12-04 ENCOUNTER — Other Ambulatory Visit: Payer: Self-pay | Admitting: Family Medicine

## 2014-12-05 NOTE — Telephone Encounter (Signed)
Please ask the patient to come in for an appt prior to any refills, as she should've followed up with me in October.  Thanks, Theresa Puffrystal S. [name], MD [facility] Res[facility]  12/05/2014, 11:37 AM

## 2014-12-27 ENCOUNTER — Other Ambulatory Visit: Payer: Self-pay | Admitting: Family Medicine

## 2014-12-30 NOTE — Telephone Encounter (Signed)
Rx refilled x 2 months. Please have the patient make an appt with me.  Thanks, Joanna Puff, MD [facility] Resident

## 2015-02-28 ENCOUNTER — Other Ambulatory Visit: Payer: Self-pay | Admitting: Family Medicine

## 2015-03-02 NOTE — Telephone Encounter (Signed)
Paxil refill x 2 months. Please let the patient know I will not refill this again without her seeing me.  Thanks, Joanna Puff, MD [facility] Resident  03/02/2015, 1:38 PM

## 2015-03-03 NOTE — Telephone Encounter (Signed)
Left message with patient daughter to have her call back and schedule an appointment.

## 2015-03-27 ENCOUNTER — Ambulatory Visit: Payer: Medicaid Other | Admitting: Family Medicine

## 2015-04-22 ENCOUNTER — Ambulatory Visit: Payer: Medicaid Other | Admitting: Family Medicine

## 2015-05-13 ENCOUNTER — Other Ambulatory Visit: Payer: Self-pay | Admitting: Family Medicine

## 2015-05-13 MED ORDER — PAROXETINE HCL 20 MG PO TABS: 20.0000 mg | ORAL_TABLET | Freq: Every day | ORAL | Status: DC

## 2015-05-13 NOTE — Telephone Encounter (Signed)
Pt called and needs a refill on PARoxetine (PAXIL) 20 MG tablet Thank you, Sadie Reynolds, ASA

## 2015-06-10 ENCOUNTER — Other Ambulatory Visit (HOSPITAL_COMMUNITY)
Admission: RE | Admit: 2015-06-10 | Discharge: 2015-06-10 | Disposition: A | Discharge status: Home / Self Care | Payer: Medicaid Other | Source: Ambulatory Visit | Attending: Family Medicine | Admitting: Family Medicine

## 2015-06-10 ENCOUNTER — Ambulatory Visit (INDEPENDENT_AMBULATORY_CARE_PROVIDER_SITE_OTHER): Payer: Medicaid Other | Admitting: Family Medicine

## 2015-06-10 VITALS — BP 123/85 | HR 85 | Temp 98.5°F | Ht 61.0 in | Wt 168.2 lb

## 2015-06-10 DIAGNOSIS — R300 Dysuria: Secondary | ICD-10-CM | POA: Diagnosis not present

## 2015-06-10 DIAGNOSIS — N760 Acute vaginitis: Secondary | ICD-10-CM | POA: Diagnosis not present

## 2015-06-10 DIAGNOSIS — Z113 Encounter for screening for infections with a predominantly sexual mode of transmission: Secondary | ICD-10-CM | POA: Diagnosis present

## 2015-06-10 DIAGNOSIS — Z23 Encounter for immunization: Secondary | ICD-10-CM | POA: Diagnosis not present

## 2015-06-10 LAB — POCT URINALYSIS DIPSTICK
Bilirubin, UA: NEGATIVE
Glucose, UA: NEGATIVE
Ketones, UA: NEGATIVE
Leukocytes, UA: NEGATIVE
NITRITE UA: NEGATIVE
PH UA: 5.5
Urobilinogen, UA: 0.2

## 2015-06-10 LAB — POCT WET PREP (WET MOUNT): Clue Cells Wet Prep Whiff POC: POSITIVE

## 2015-06-10 LAB — POCT UA - MICROSCOPIC ONLY

## 2015-06-10 MED ORDER — METRONIDAZOLE 500 MG PO TABS: 500.0000 mg | ORAL_TABLET | Freq: Two times a day (BID) | ORAL | Status: DC

## 2015-06-10 MED ORDER — PAROXETINE HCL 30 MG PO TABS: 30.0000 mg | ORAL_TABLET | Freq: Every day | ORAL | Status: DC

## 2015-06-10 NOTE — Patient Instructions (Addendum)
I have increased your Paxil.  If you have thoughts of harming yourself or others, you have options: contact family, our clinic, go to the emergency room, call our clinic, or call the MOBILE CRISIS LINE (Toll Free): [phone] that is available day or night.   Major Depressive Disorder Major depressive disorder is a mental illness. It also may be called clinical depression or unipolar depression. Major depressive disorder usually causes feelings of sadness, hopelessness, or helplessness. Some people with this disorder do not feel particularly sad but lose interest in doing things they used to enjoy (anhedonia). Major depressive disorder also can cause physical symptoms. It can interfere with work, school, relationships, and other normal everyday activities. The disorder varies in severity but is longer lasting and more serious than the sadness we all feel from time to time in our lives. Major depressive disorder often is triggered by stressful life events or major life changes. Examples of these triggers include divorce, loss of your job or home, a move, and the death of a family member or close friend. Sometimes this disorder occurs for no obvious reason at all. People who have family members with major depressive disorder or bipolar disorder are at higher risk for developing this disorder, with or without life stressors. Major depressive disorder can occur at any age. It may occur just once in your life (single episode major depressive disorder). It may occur multiple times (recurrent major depressive disorder). SYMPTOMS People with major depressive disorder have either anhedonia or depressed mood on nearly a daily basis for at least 2 weeks or longer. Symptoms of depressed mood include:  Feelings of sadness (blue or down in the dumps) or emptiness.  Feelings of hopelessness or helplessness.  Tearfulness or episodes of crying (may be observed by others).  Irritability (children and  adolescents). In addition to depressed mood or anhedonia or both, people with this disorder have at least four of the following symptoms:  Difficulty sleeping or sleeping too much.   Significant change (increase or decrease) in appetite or weight.   Lack of energy or motivation.  Feelings of guilt and worthlessness.   Difficulty concentrating, remembering, or making decisions.  Unusually slow movement (psychomotor retardation) or restlessness (as observed by others).   Recurrent wishes for death, recurrent thoughts of self-harm (suicide), or a suicide attempt. People with major depressive disorder commonly have persistent negative thoughts about themselves, other people, and the world. People with severe major depressive disorder may experiencedistorted beliefs or perceptions about the world (psychotic delusions). They also may see or hear things that are not real (psychotic hallucinations). DIAGNOSIS Major depressive disorder is diagnosed through an assessment by your health care provider. Your health care provider will ask aboutaspects of your daily life, such as mood,sleep, and appetite, to see if you have the diagnostic symptoms of major depressive disorder. Your health care provider may ask about your medical history and use of alcohol or drugs, including prescription medicines. Your health care provider also may do a physical exam and blood work. This is because certain medical conditions and the use of certain substances can cause major depressive disorder-like symptoms (secondary depression). Your health care provider also may refer you to a mental health specialist for further evaluation and treatment. TREATMENT It is important to recognize the symptoms of major depressive disorder and seek treatment. The following treatments can be prescribed for this disorder:   Medicine. Antidepressant medicines usually are prescribed. Antidepressant medicines are thought to correct chemical  imbalances in the brain  that are commonly associated with major depressive disorder. Other types of medicine may be added if the symptoms do not respond to antidepressant medicines alone or if psychotic delusions or hallucinations occur.  Talk therapy. Talk therapy can be helpful in treating major depressive disorder by providing support, education, and guidance. Certain types of talk therapy also can help with negative thinking (cognitive behavioral therapy) and with relationship issues that trigger this disorder (interpersonal therapy). A mental health specialist can help determine which treatment is best for you. Most people with major depressive disorder do well with a combination of medicine and talk therapy. Treatments involving electrical stimulation of the brain can be used in situations with extremely severe symptoms or when medicine and talk therapy do not work over time. These treatments include electroconvulsive therapy, transcranial magnetic stimulation, and vagal nerve stimulation.   This information is not intended to replace advice given to you by your health care provider. Make sure you discuss any questions you have with your health care provider.   Document Released: 09/10/2012 Document Revised: 06/06/2014 Document Reviewed: 09/10/2012 Elsevier Interactive Patient Education [employer] Elsevier Inc.

## 2015-06-10 NOTE — Assessment & Plan Note (Signed)
Patient denies h/o bipolar disorder to me, but a family history is noted in EMR in the past. MDQ performed with patient when she was initially treated for MDD was negative. Patient voiced distant failed suicide attempt. Denies any SI currently and is very re-assuring that she can contact our clinic, go to the emergency room, or contact the Mobile Crisis number if she has a psychiatric emergency.  - Will increase Paxil to 30mg . - Encourage patient to f/u in 2-4 weeks during a time when integrated care is available.  - Once again, discussed safety plan with patient.

## 2015-06-10 NOTE — Progress Notes (Signed)
Patient ID: [name], female   DOB: 06/26/1970, 45 y.o.   MRN: [id number] [facility]  Joanna Puffrystal S. Sherre Wooton, MD  Subjective:  Theresa Olson is a 45 y/o presenting for a physical exam, however has numerous complaints today including worsening depression/anxiety, dysuria, and dark colored vaginal discharge.   Anxiety/Depression:Currently on Paxil 20mg  daily, was supposed to f/u in Oct 2016 however kept canceling appts and no showing. Patient states she's concerned about going to the doctor because something "might be wrong."  Patient very anxious all the time. Worries about everything. Difficulty falling asleep and staying asleep. Feels like her appetite is poor and she's losing weight. Endorses feelings of hopelessness. Very fatigued. Feels like she's a failure and a disappointment. Patient has grown children and 711- 46 year old daughter who still haves at home. She notes she normally doesn't want to go out to watch her daughter play volleyball but notes she's happy once she does. 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  No SI/HI or hallucinations.  No family h/o bipolar disorder. No psychiatric hospitalizations. Has attempted suicide in the past approximately 15 years ago by taking pills but notes "nothing happened" and she woke up the next morning. No other attempts.   GAD 20, very difficult  PHQ-9: 26, somewhat difficult  Vaginal discharge/dysuria:  Patient seems very confused when discussing this.  Patient noted to CMA that she's had dark brown and black vaginal discharge for a month. When I question her about this she notes that she's had this vaginal discharge for over a year and notes that it comes intermittently each month, not associated with menstrual period. She then starts discussing how she notices it after she's been holding in her urine for too long and when I questioned her again she notes it is her urine that is brown/black, but then later  states her vaginal discharge is also brown/black. Initially states no concerns for STDs but then notes that she's had unprotected intercourse.   LMP 05/16/16 (irregular)   Also endorses urinary urgency for several months, some dysuria with initiation of urination. Urine is not malodorous, has dark colored urine. Occasionally has suprapubic pain  ROS- No chest pain, SOB, headaches, blurred vision, vaginal bleeding.   Past Medical History Patient Active Problem List   Diagnosis Date Noted  . Vaginitis and vulvovaginitis 06/05/2013  . Recurrent major depression-severe (HCC) 11/02/2011  . TOBACCO USER 03/07/2009  . ANXIETY DISORDER, GENERALIZED 08/20/2008    Medications- reviewed and updated Current Outpatient Prescriptions  Medication Sig Dispense Refill  . metroNIDAZOLE (FLAGYL) 500 MG tablet Take 1 tablet (500 mg total) by mouth 2 (two) times daily. 14 tablet 0  . PARoxetine (PAXIL) 30 MG tablet Take 1 tablet (30 mg total) by mouth daily. 30 tablet 1  . vitamin C (ASCORBIC ACID) 500 MG tablet Take 500 mg by mouth every morning.     No current facility-administered medications for this visit.    Objective: BP 123/85 mmHg  Pulse 85  Temp(Src) 98.5 F (36.9 C) (Oral)  Ht 5\' 1"  (1.549 m)  Wt 168 lb 3.2 oz (76.295 kg)  BMI 31.80 kg/m2  LMP 05/22/2015 (Approximate) Gen: No acute distress. Alert, cooperative with exam. Appears anxious, shaking legs, fidgeting HEENT: Atraumatic,Oropharynx clear. MMM CV: RRR. No murmurs, rubs, or gallops noted. 2+ radial and DP pulses bilaterally. Resp: CTAB. No wheezing, crackles, or rhonchi noted. Abd: +BS. Soft, non-distended, non-tender. Specifically, no pain with palpation over the  suprapubic area GYN:  External genitalia within normal limits.  Vaginal mucosa pink, moist, normal rugae.  Nonfriable cervix without lesions, moderate about of malodorous yellow/white vaginal discharge. No bleeding noted on speculum exam.  Bimanual exam revealed normal,  nongravid uterus.  No cervical motion tenderness. No adnexal masses bilaterally.   Ext: No edema. No gross deformities. Psych: Normal speech rate and tone. Stated mood depressed, flat affect.  Denies SI or HI. No AVH. Does not appear to be internally distracted. For the most part tends to answer questions appropriately however sometimes appears confused in answering questions.   Wet prep: Small clue cells, + whiff test  U/A: neg LE or nitrite   Assessment/Plan:  Vaginitis and vulvovaginitis Wet prep and exam consistent with bacterial vaginosis. No evidence of vaginal bleeding. U/A with clear yellow urine.  - Rx Flagyl 500mg  BID x 7 days- discussed disulfram reaction - GC/chlamydia pending - patient left from lab sub-waiting room therefore HIV and RPR not done.  - RTC precautions discussed.   Recurrent major depression-severe Patient denies h/o bipolar disorder to me, but a family history is noted in EMR in the past. MDQ performed with patient when she was initially treated for MDD was negative. Patient voiced distant failed suicide attempt. Denies any SI currently and is very re-assuring that she can contact our clinic, go to the emergency room, or contact the Mobile Crisis number if she has a psychiatric emergency.  - Will increase Paxil to 30mg . - Encourage patient to f/u in 2-4 weeks during a time when integrated care is available.  - Once again, discussed safety plan with patient.    Orders Placed This Encounter  Procedures  . Flu Vaccine QUAD 36+ mos IM  . HIV antibody (with reflex)  . RPR  . POCT urinalysis dipstick  . POCT Wet Prep [employer])  . POCT UA - Microscopic Only    Meds ordered this encounter  Medications  . PARoxetine (PAXIL) 30 MG tablet    Sig: Take 1 tablet (30 mg total) by mouth daily.    Dispense:  30 tablet    Refill:  1  . metroNIDAZOLE (FLAGYL) 500 MG tablet    Sig: Take 1 tablet (500 mg total) by mouth 2 (two) times daily.    Dispense:  14 tablet     Refill:  0

## 2015-06-10 NOTE — Assessment & Plan Note (Signed)
Wet prep and exam consistent with bacterial vaginosis. No evidence of vaginal bleeding. U/A with clear yellow urine.  - Rx Flagyl 500mg  BID x 7 days- discussed disulfram reaction - GC/chlamydia pending - patient left from lab sub-waiting room therefore HIV and RPR not done.  - RTC precautions discussed.

## 2015-06-11 LAB — CERVICOVAGINAL ANCILLARY ONLY
Chlamydia: NEGATIVE
NEISSERIA GONORRHEA: NEGATIVE

## 2015-06-12 ENCOUNTER — Telehealth: Payer: Self-pay | Admitting: Family Medicine

## 2015-06-12 NOTE — Telephone Encounter (Signed)
Patient informed, expressed understanding. 

## 2015-06-12 NOTE — Telephone Encounter (Signed)
Please call and let Ms, Tresa EndoKelly know that her tests for gonorrhea and chlamydia are negative.   Thanks, Joanna Puffrystal S. Dorsey, MD [facility] Res[facility]  06/12/2015, 8:48 AM

## 2015-06-26 ENCOUNTER — Ambulatory Visit: Payer: Medicaid Other

## 2015-06-26 ENCOUNTER — Ambulatory Visit: Payer: Medicaid Other | Admitting: Family Medicine

## 2015-07-02 ENCOUNTER — Ambulatory Visit: Payer: Medicaid Other | Admitting: Family Medicine

## 2015-08-12 ENCOUNTER — Ambulatory Visit: Payer: Medicaid Other | Admitting: Family Medicine

## 2015-08-13 ENCOUNTER — Other Ambulatory Visit: Payer: Self-pay | Admitting: Family Medicine

## 2015-11-06 ENCOUNTER — Other Ambulatory Visit: Payer: Self-pay | Admitting: Family Medicine

## 2015-11-07 NOTE — Telephone Encounter (Signed)
Refilled antidepressant x 2 months total. Please have the patient f/u with me before she runs out.  Thanks, Joanna Puffrystal S. Dorsey, MD [facility] Res[facility]  11/07/2015, 8:59 AM

## 2016-02-19 ENCOUNTER — Other Ambulatory Visit: Payer: Self-pay | Admitting: *Deleted

## 2016-02-20 MED ORDER — PAROXETINE HCL 30 MG PO TABS: ORAL_TABLET | ORAL | 1 refills | Status: DC

## 2016-03-01 ENCOUNTER — Ambulatory Visit (INDEPENDENT_AMBULATORY_CARE_PROVIDER_SITE_OTHER): Payer: Medicaid Other | Admitting: Family Medicine

## 2016-03-01 ENCOUNTER — Encounter: Payer: Self-pay | Admitting: Family Medicine

## 2016-03-01 VITALS — BP 138/68 | HR 75 | Temp 99.1°F | Ht 61.0 in | Wt 166.0 lb

## 2016-03-01 DIAGNOSIS — Z23 Encounter for immunization: Secondary | ICD-10-CM | POA: Diagnosis present

## 2016-03-01 DIAGNOSIS — F332 Major depressive disorder, recurrent severe without psychotic features: Secondary | ICD-10-CM

## 2016-03-01 MED ORDER — PAROXETINE HCL 40 MG PO TABS: 40.0000 mg | ORAL_TABLET | ORAL | 2 refills | Status: DC

## 2016-03-01 NOTE — Progress Notes (Signed)
    Subjective: CC: f/u depression  HPI: Patient is a 45 y.o. female with a past medical history of anxiety and MDD with a h/o suicide attempt in the distant presenting to clinic today for a f/u on depression .  Depression: she was last seen on 05/2015- she was advised to f/u in 2-4wks however was unable to get there. Doing much better with Paxil. Both her and her daughter note that she's significantly improved from previously. She 's not in bed as much. She's up cooking and gets out of the house. She's not as irritable per her daughter. Pt notes she's not as depressed. Her appetite comes and goes. She is sleeping well. 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 She denies any more "bad thoughts."  No SI, HI, or AVH. Doing better overall but still feels like there is work to be done. No side effects from the medication.   Social History: current smoker  Health Maintenance: she [facility] [address]. Due for Tdap.   ROS: All other systems reviewed and are negative besides that noted in HPI.  Past Medical History Patient Active Problem List   Diagnosis Date Noted  . Vaginitis and vulvovaginitis 06/05/2013  . Recurrent major depression-severe (HCC) 11/02/2011  . TOBACCO USER 03/07/2009  . ANXIETY DISORDER, GENERALIZED 08/20/2008    Medications- reviewed and updated  Objective: Office vital signs reviewed. BP 138/68   Pulse 75   Temp 99.1 F (37.3 C) (Oral)   Ht 5\' 1"  (1.549 m)   Wt 166 lb (75.3 kg)   LMP 02/23/2016   BMI 31.37 kg/m    Physical Examination:  General: Awake, alert, well- nourished, NAD, good eye contact, smiling Cardio: RRR, no m/r/g noted.  Pulm: No increased WOB.  CTAB, without wheezes, rhonchi or crackles noted.  Psych: A&O x 4. Stated mood "good", affect congruent. Speech clear with a regular rate and tone. Non-pressured. thought  content appropriate.  No SI, HI, or AVH. Does not appear to be internally distracted. Judgement and insight good.  Assessment/Plan: Recurrent major depression-severe Patient seems to be improved from a depression stand point. No evidence of manic episodes given SSRI use. No SI, HI, or AVH currently.  - given information on depression, pt still has Mobile crisis number if needed.  - pt notes her daughter a big support system for her. - will increase Paxil to 40mg  daily  - pt to f/u in 2-4 weeks.   No orders of the defined types were placed in this encounter.  Health maintenance: [facility].   Meds ordered this encounter  Medications  . PARoxetine (PAXIL) 40 MG tablet    Sig: Take 1 tablet (40 mg total) by mouth every morning.    Dispense:  30 tablet    Refill:  2    Joanna Puffrystal S. Sayla Golonka PGY-3, [facility]

## 2016-03-01 NOTE — Patient Instructions (Signed)
Its good to see you again, I'm glad you're doing better!  I have increased your dose of Paxil from 30mg  to 40mg , you can complete the 30mg  capsules that you have now.  Follow up with me in 1-2 months.  Paroxetine tablets What is this medicine? PAROXETINE (pa ROX e teen) is used to treat depression. It may also be used to treat anxiety disorders, obsessive compulsive disorder, panic attacks, post traumatic stress, and premenstrual dysphoric disorder (PMDD). This medicine may be used for other purposes; ask your health care provider or pharmacist if you have questions. What should I tell my health care provider before I take this medicine? They need to know if you have any of these conditions: -bleeding disorders -glaucoma -heart disease -kidney disease -liver disease -low levels of sodium in the blood -mania or bipolar disorder -seizures -suicidal thoughts, plans, or attempt; a previous suicide attempt by you or a family member -take MAOIs like Carbex, Eldepryl, Marplan, Nardil, and Parnate -take medicines that treat or prevent blood clots -an unusual or allergic reaction to paroxetine, other medicines, foods, dyes, or preservatives -pregnant or trying to get pregnant -breast-feeding How should I use this medicine? Take this medicine by mouth with a glass of water. Follow the directions on the prescription label. You can take it with or without food. Take your medicine at regular intervals. Do not take your medicine more often than directed. Do not stop taking this medicine suddenly except upon the advice of your doctor. Stopping this medicine too quickly may cause serious side effects or your condition may worsen. A special MedGuide will be given to you by the pharmacist with each prescription and refill. Be sure to read this information carefully each time. Talk to your pediatrician regarding the use of this medicine in children. Special care may be needed. Overdosage: If you think you  have taken too much of this medicine contact a poison control center or emergency room at once. NOTE: This medicine is only for you. Do not share this medicine with others. What if I miss a dose? If you miss a dose, take it as soon as you can. If it is almost time for your next dose, take only that dose. Do not take double or extra doses. What may interact with this medicine? Do not take this medicine with any of the following medications: -linezolid -MAOIs like Carbex, Eldepryl, Marplan, Nardil, and Parnate -methylene blue (injected into a vein) -pimozide -thioridazine This medicine may also interact with the following medications: -alcohol -aspirin and aspirin-like medicines -atomoxetine -certain medicines for depression, anxiety, or psychotic disturbances -certain medicines for irregular heart beat like propafenone, flecainide, encainide, and quinidine -certain medicines for migraine headache like almotriptan, eletriptan, frovatriptan, naratriptan, rizatriptan, sumatriptan, zolmitriptan -cimetidine -digoxin -diuretics -fentanyl -fosamprenavir/ritonavir -furazolidone -isoniazid -lithium -medicines that treat or prevent blood clots like warfarin, enoxaparin, and dalteparin -medicines for sleep -metoprolol -NSAIDs, medicines for pain and inflammation, like ibuprofen or naproxen -phenobarbital -phenytoin -procarbazine -procyclidine -rasagiline -supplements like St. John's wort, kava kava, valerian -tamoxifen -theophylline -tramadol -tryptophan This list may not describe all possible interactions. Give your health care provider a list of all the medicines, herbs, non-prescription drugs, or dietary supplements you use. Also tell them if you smoke, drink alcohol, or use illegal drugs. Some items may interact with your medicine. What should I watch for while using this medicine? Tell your doctor if your symptoms do not get better or if they get worse. Visit your doctor or health  care professional for  regular checks on your progress. Because it may take several weeks to see the full effects of this medicine, it is important to continue your treatment as prescribed by your doctor. Patients and their families should watch out for new or worsening thoughts of suicide or depression. Also watch out for sudden changes in feelings such as feeling anxious, agitated, panicky, irritable, hostile, aggressive, impulsive, severely restless, overly excited and hyperactive, or not being able to sleep. If this happens, especially at the beginning of treatment or after a change in dose, call your health care professional. Bonita Quin may get drowsy or dizzy. Do not drive, use machinery, or do anything that needs mental alertness until you know how this medicine affects you. Do not stand or sit up quickly, especially if you are an older patient. This reduces the risk of dizzy or fainting spells. Alcohol may interfere with the effect of this medicine. Avoid alcoholic drinks. Your mouth may get dry. Chewing sugarless gum or sucking hard candy, and drinking plenty of water will help. Contact your doctor if the problem does not go away or is severe. What side effects may I notice from receiving this medicine? Side effects that you should report to your doctor or health care professional as soon as possible: -allergic reactions like skin rash, itching or hives, swelling of the face, lips, or tongue -black or bloody stools, blood in the urine or vomit -fast, irregular heartbeat -hallucination, loss of contact with reality -painful or prolonged erection (men) -seizures -suicidal thoughts or other mood changes -trouble passing urine or change in the amount of urine -unusual bleeding or bruising -unusually weak or tired -vomiting Side effects that usually do not require medical attention (report to your doctor or health care professional if they continue or are bothersome): -change in appetite,  weight -change in sex drive or performance -constipation or diarrhea -difficulty sleeping -drowsy -headache -increased sweating -muscle pain or weakness -tremors This list may not describe all possible side effects. Call your doctor for medical advice about side effects. You may report side effects to FDA at 1-800-FDA-1088. Where should I keep my medicine? Keep out of the reach of children. Store at room temperature between 15 and 30 degrees C (59 and 86 degrees F). Keep container tightly closed. Throw away any unused medicine after the expiration date. NOTE: This sheet is a summary. It may not cover all possible information. If you have questions about this medicine, talk to your doctor, pharmacist, or health care provider.    2016, Elsevier/Gold Standard. (2011-12-29 18:10:02)

## 2016-03-03 NOTE — Assessment & Plan Note (Addendum)
Patient seems to be improved from a depression stand point. No evidence of manic episodes given SSRI use. No SI, HI, or AVH currently.  - given information on depression, pt still has Mobile crisis number if needed.  - pt notes her daughter a big support system for her. - will increase Paxil to 40mg  daily  - pt to f/u in 2-4 weeks.

## 2016-03-03 NOTE — Addendum Note (Signed)
Addended by: Garen GramsBENTON, ASHA F on: 03/03/2016 11:38 AM   Modules accepted: Orders

## 2016-04-01 ENCOUNTER — Ambulatory Visit: Payer: Medicaid Other | Admitting: Family Medicine

## 2016-04-05 ENCOUNTER — Ambulatory Visit: Payer: Medicaid Other | Admitting: Family Medicine

## 2016-04-13 ENCOUNTER — Ambulatory Visit: Payer: Medicaid Other | Admitting: Family Medicine

## 2016-04-20 ENCOUNTER — Ambulatory Visit: Payer: Medicaid Other | Admitting: Family Medicine

## 2016-06-10 ENCOUNTER — Ambulatory Visit: Payer: Medicaid Other | Admitting: Family Medicine

## 2016-06-22 ENCOUNTER — Other Ambulatory Visit: Payer: Self-pay | Admitting: Family Medicine

## 2016-06-23 NOTE — Telephone Encounter (Signed)
Patient informed, states she will call back to schedule appointment. 

## 2016-06-23 NOTE — Telephone Encounter (Signed)
Refilled Paxil for 1 month supply. Please let her know that she needs to f/u in our clinic.  Thanks, Joanna Puffrystal S. Andrue Dini, MD [facility] Resident  06/23/2016, 9:13 AM

## 2016-07-20 ENCOUNTER — Other Ambulatory Visit: Payer: Self-pay | Admitting: Family Medicine

## 2016-07-28 ENCOUNTER — Ambulatory Visit: Payer: Medicaid Other | Admitting: Family Medicine

## 2016-08-04 ENCOUNTER — Telehealth: Payer: Self-pay | Admitting: Family Medicine

## 2016-08-04 ENCOUNTER — Ambulatory Visit: Payer: Medicaid Other | Admitting: Family Medicine

## 2016-08-04 NOTE — Telephone Encounter (Signed)
Patient was a no-show to her appointment. She has no showed for several appointments in the past several months.  I called discussed this with the patient and noted that our clinic has started to dismiss patients who frequently no-show. The patient voiced understanding as she knows those appointment slots could have been utilized for the patients.  I rescheduled her appointment for March 21 at 9:45 AM. I stressed that if she was unable to get to this appointment, to call and notify our office as soon as possible, preferably more than 24 hours prior to the appointment time.   Joanna Puffrystal S. Dorsey, MD [facility] Resident  08/04/2016, 4:51 PM

## 2016-08-11 ENCOUNTER — Encounter: Payer: Self-pay | Admitting: Family Medicine

## 2016-08-11 DIAGNOSIS — Z5329 Procedure and treatment not carried out because of patient's decision for other reasons: Secondary | ICD-10-CM

## 2016-08-11 DIAGNOSIS — Z91199 Patient's noncompliance with other medical treatment and regimen due to unspecified reason: Secondary | ICD-10-CM

## 2016-08-17 ENCOUNTER — Ambulatory Visit: Payer: Medicaid Other | Admitting: Family Medicine

## 2016-08-23 ENCOUNTER — Ambulatory Visit: Payer: Medicaid Other | Admitting: Family Medicine

## 2016-09-05 ENCOUNTER — Ambulatory Visit (INDEPENDENT_AMBULATORY_CARE_PROVIDER_SITE_OTHER): Payer: Medicaid Other | Admitting: Family Medicine

## 2016-09-05 VITALS — BP 126/88 | HR 87 | Ht 61.0 in | Wt 173.0 lb

## 2016-09-05 DIAGNOSIS — F411 Generalized anxiety disorder: Secondary | ICD-10-CM

## 2016-09-05 DIAGNOSIS — F332 Major depressive disorder, recurrent severe without psychotic features: Secondary | ICD-10-CM

## 2016-09-05 MED ORDER — PAROXETINE HCL 30 MG PO TABS
60.0000 mg | ORAL_TABLET | ORAL | 0 refills | Status: DC
Start: 2016-09-05 — End: 2016-10-06

## 2016-09-05 NOTE — Assessment & Plan Note (Signed)
Patients reports being stable on Paxil, however PHQ-9 was 24. When I point this out, she notes she does feel there is work to be done with her current regimen. She denies h/o SI, HIV, or AVH. She does seem bothered significantly from what seems to be social anxiety, I'm curious if staying secluded could be contributing to her depression. - increase Paxil to  (max dose) - pt contracted for safety. - given contact information for the MOBILE CRISIS LINE. - pt to f/u in 4 weeks.

## 2016-09-05 NOTE — Progress Notes (Signed)
    Subjective: CC: f/u depression  HPI: Patient is a 46 y.o. female with a past medical history of anxiety and MDD with a h/o suicide attempt in the distant presenting to clinic today for a f/u on depression .  Depression: she was last seen on 02/2016- she was advised to f/u in 2-4wks however was unable to get there. Initially states she's doing well on Paxil, however PHQ9 24, extremely difficult . She notes little joy in things, poor concentration, difficulty falling asleep and then sleeping too much. She overeats, feels bad about herself, and feels depressed most days of the week. Not as irritable. She doesn't like to be around people or going outside, she notes significant anxiety with this. She feels like people are watching her and notes significant anxiety in public.  No SI, HI, or AVH. Social History: current smoker  Health Maintenance: scheduled for pap smear today but pt declines as she's currently on her period.   ROS: All other systems reviewed and are negative besides that noted in HPI.  Past Medical History Patient Active Problem List   Diagnosis Date Noted  . Frequent No-show for appointment 08/11/2016  . Recurrent major depression-severe (HCC) 11/02/2011  . TOBACCO USER 03/07/2009  . ANXIETY DISORDER, GENERALIZED 08/20/2008    Medications- reviewed and updated  Objective: Office vital signs reviewed. BP 126/88   Pulse 87   Ht  (1.549 m)   Wt 173 lb (78.5 kg)   BMI 32.69 kg/m    Physical Examination:  General: Awake, alert, well- nourished, NAD, good eye contact, smiling Cardio: RRR, no m/r/g noted.  Pulm: No increased WOB.  CTAB, without wheezes, rhonchi or crackles noted.  Psych: A&O x 4. Stated mood "ok", affect congruent. Speech clear with a regular rate and tone. Non-pressured. thought content appropriate.  No SI, HI, or AVH. Does not appear to be internally distracted. Judgement and insight good.  Assessment/Plan: Recurrent major  depression-severe Patients reports being stable on Paxil, however PHQ-9 was 24. When I point this out, she notes she does feel there is work to be done with her current regimen. She denies h/o SI, HIV, or AVH. She does seem bothered significantly from what seems to be social anxiety, I'm curious if staying secluded could be contributing to her depression. - increase Paxil to  (max dose) - pt contracted for safety. - given contact information for the MOBILE CRISIS LINE. - pt to f/u in 4 weeks.    No orders of the defined types were placed in this encounter.   I spent more than 50% of this 30 minute visit in direct counseling    Meds ordered this encounter  Medications  . PARoxetine (PAXIL) 30 MG tablet    Sig: Take 2 tablets (60 mg total) by mouth every morning.    Dispense:  60 tablet    Refill:  0    Joanna Puff PGY-3, [facility]

## 2016-09-05 NOTE — Patient Instructions (Signed)
Increase your Paxil to  daily. This is the highest it can go.  I want you to pick something that you can do daily at least 5 times per week- exercise, read, do a puzzle, talk to someone of the phone, or cook are a few examples  If you have thoughts of harming yourself or others, you have options: contact family/friends, our clinic, go to the emergency room, call our clinic, or call the MOBILE CRISIS LINE (Toll Free): [phone] that is available day or night.

## 2016-10-06 ENCOUNTER — Telehealth: Payer: Self-pay | Admitting: Family Medicine

## 2016-10-06 MED ORDER — PAROXETINE HCL 30 MG PO TABS: 60.0000 mg | ORAL_TABLET | ORAL | 0 refills | Status: DC

## 2016-10-06 NOTE — Telephone Encounter (Signed)
Pt calling to request refill of:  Name of Medication(s):paxil Last date of OV:4/18 [facility] [address] [facility]  Will route refill request to Clinic RN.  Discussed with patient policy to call pharmacy for future refills.  Also, discussed refills may take up to 48 hours to approve or deny.  Avanell ShackletonHarriet C Shelton

## 2016-10-13 ENCOUNTER — Ambulatory Visit: Payer: Medicaid Other | Admitting: Family Medicine

## 2016-10-31 ENCOUNTER — Ambulatory Visit (INDEPENDENT_AMBULATORY_CARE_PROVIDER_SITE_OTHER): Payer: Medicaid Other | Admitting: Family Medicine

## 2016-10-31 ENCOUNTER — Telehealth: Payer: Self-pay | Admitting: *Deleted

## 2016-10-31 VITALS — BP 156/108 | Temp 98.6°F | Wt 169.0 lb

## 2016-10-31 DIAGNOSIS — F332 Major depressive disorder, recurrent severe without psychotic features: Secondary | ICD-10-CM

## 2016-10-31 DIAGNOSIS — N912 Amenorrhea, unspecified: Secondary | ICD-10-CM | POA: Diagnosis not present

## 2016-10-31 LAB — POCT URINE PREGNANCY: PREG TEST UR: NEGATIVE

## 2016-10-31 MED ORDER — PRENATAL PLUS 27-1 MG PO TABS: 1.0000 | ORAL_TABLET | Freq: Every day | ORAL | 2 refills | Status: DC

## 2016-10-31 MED ORDER — FLUOXETINE HCL 40 MG PO CAPS: 40.0000 mg | ORAL_CAPSULE | Freq: Every day | ORAL | 0 refills | Status: DC

## 2016-10-31 MED ORDER — PRENATAL VITAMIN 27-0.8 MG PO TABS: 1.0000 | ORAL_TABLET | Freq: Every day | ORAL | 1 refills | Status: DC

## 2016-10-31 NOTE — Patient Instructions (Signed)
STOP Paxil. START Prozac 40mg  daily.  Follow up with me for depression in 2-4 weeks.  I have prescribed prenatal vitamins as well in case you would like to get pregnant

## 2016-10-31 NOTE — Progress Notes (Signed)
U

## 2016-10-31 NOTE — Telephone Encounter (Signed)
[facility] to [facility] pt's prescription for prenatal vitamins to Prenatal Plus. Pt can also purchase OTC vitamins.  Pt's insurance should cover the Prenatal Plus.  Clovis PuMartin, Tamika L, RN

## 2016-10-31 NOTE — Telephone Encounter (Signed)
Done.  Joanna Puffrystal S. Dorsey, MD [facility] Resident  10/31/2016, 5:28 PM\

## 2016-10-31 NOTE — Progress Notes (Signed)
Subjective: CC: f/u depression  HPI: Patient is a 46 y.o. female with a past medical history of anxiety and MDD with a h/o suicide attempt in the distant presenting to clinic today for a f/u on depression.   Depression: she was last seen on 08/2016. She notes improvement in mood, however indicates continued decrease in interest and pleasure in doing things, trouble falling asleep, and poor energy.  She has got a new job and she is very excited about this. She is concerned about whether or not she is fully equipped to do this job. No SI, HI, or AVH.   PHQ 20, somewhat difficult  Additionally, the patient request a pregnancy test today. She states she feels like she could be pregnant because her abdomen feels different. Her LMP was 10/08/16. Urine pregnancy was on the obtained prior to this discussion. I stated is too early to know whether or not she is truly pregnant as she is not had any missed periods. The patient is unsure whether or not she wants to have a child. She has a 47 year old currently. She states she "doesn't want to be alone". She is currently a relationship with a married man who has recently been released from prison. She is up in the air on whether or not she wants to get pregnant in the future. She is not actively trying to prevent pregnancy in anyway.   Social History: current smoker  Health Maintenance: scheduled for pap smear today but pt declines   ROS: All other systems reviewed and are negative besides that noted in HPI.  Past Medical History Patient Active Problem List   Diagnosis Date Noted  . Frequent No-show for appointment 08/11/2016  . Recurrent major depression-severe (HCC) 11/02/2011  . TOBACCO USER 03/07/2009  . ANXIETY DISORDER, GENERALIZED 08/20/2008    Medications- reviewed and updated  Objective: Office vital signs reviewed. BP (!) 156/108   Temp 98.6 F (37 C) (Oral)   Wt 169 lb (76.7 kg)   LMP 09/05/2016   BMI 31.93 kg/m    Physical  Examination:  General: Awake, alert, well- nourished, NAD, good eye contact, smiling Cardio: RRR, no m/r/g noted.  Pulm: No increased WOB.  CTAB, without wheezes, rhonchi or crackles noted.  Psych: A&O x 4. Stated mood "good", affect congruent. Speech clear with a regular rate and tone. Non-pressured. thought content appropriate.  No SI, HI, or AVH. Does not appear to be internally distracted. Judgement and insight good.  Assessment/Plan: Recurrent major depression-severe PHQ 20 which is stable from previous, however pt reports improved symptoms. Discussed increased risk of fetal abnormalities such as heart defects with Paxil. Patient at this point still unsure whether or not she truly wants to get pregnant. Given her contemplation and the fact she is not doing anything to prevent pregnancy, will switch to Prozac 40mg . Discussed the risks / benefits of continuing any SSRIs the the setting of possible pregnancy and we both agree the benefits outweigh the risks. - STOP paxil.  - start prozac - given Rx for prenatal vitamins - f/u in 2 weeks or sooner as needed.   Orders Placed This Encounter  Procedures  . POCT urine pregnancy    I spent more than 50% of this 30 minute visit in direct counseling    Meds ordered this encounter  Medications  . FLUoxetine (PROZAC) 40 MG capsule    Sig: Take 1 capsule (40 mg total) by mouth daily.    Dispense:  30 capsule  Refill:  0  . DISCONTD: Prenatal Vit-Fe Fumarate-FA (PRENATAL VITAMIN) 27-0.8 MG TABS    Sig: Take 1 tablet by mouth daily.    Dispense:  90 tablet    Refill:  1  . prenatal vitamin w/FE, FA (PRENATAL 1 + 1) 27-1 MG TABS tablet    Sig: Take 1 tablet by mouth daily at 12 noon.    Dispense:  90 each    Refill:  2    Joanna Puff PGY-3, [facility]

## 2016-10-31 NOTE — Assessment & Plan Note (Signed)
PHQ 20 which is stable from previous, however pt reports improved symptoms. Discussed increased risk of fetal abnormalities such as heart defects with Paxil. Patient at this point still unsure whether or not she truly wants to get pregnant. Given her contemplation and the fact she is not doing anything to prevent pregnancy, will switch to Prozac 40mg . Discussed the risks / benefits of continuing any SSRIs the the setting of possible pregnancy and we both agree the benefits outweigh the risks. - STOP paxil.  - start prozac - given Rx for prenatal vitamins - f/u in 2 weeks or sooner as needed.

## 2016-11-07 ENCOUNTER — Telehealth: Payer: Self-pay | Admitting: Family Medicine

## 2016-11-07 NOTE — Telephone Encounter (Signed)
Pt is calling again about her sleeping pills. She still hasnt had her period.  She thinks she is pregnant.  Please advise

## 2016-11-07 NOTE — Telephone Encounter (Signed)
Pt is calling because she would like to have something for sleep. She is not getting any at all. jw

## 2016-11-08 NOTE — Telephone Encounter (Signed)
  The patient's LMP per our last appt was 10/08/16. 2-3 days ago she started bleeding- she is intermittently spotting, not like her normal period. Discussed that  if she's concerned she's pregnant she can take a pregnancy at home. She has a f/u for me on Monday, she can always get a Upreg at that time as well.   The best thing for sleep if she thinks she's pregnant is Unisom which she can buy over the counter.   Will see pt on Monday.  Theresa Puffrystal S. Dorsey, MD [facility] Resident  11/08/2016, 10:32 AM

## 2016-11-14 ENCOUNTER — Ambulatory Visit: Payer: Medicaid Other | Admitting: Family Medicine

## 2016-11-17 ENCOUNTER — Ambulatory Visit (INDEPENDENT_AMBULATORY_CARE_PROVIDER_SITE_OTHER): Payer: Medicaid Other | Admitting: Family Medicine

## 2016-11-17 VITALS — BP 148/90 | HR 99 | Temp 99.2°F | Ht 61.0 in | Wt 174.0 lb

## 2016-11-17 DIAGNOSIS — N912 Amenorrhea, unspecified: Secondary | ICD-10-CM

## 2016-11-17 DIAGNOSIS — F332 Major depressive disorder, recurrent severe without psychotic features: Secondary | ICD-10-CM

## 2016-11-17 LAB — POCT URINE PREGNANCY: PREG TEST UR: NEGATIVE

## 2016-11-17 MED ORDER — FLUOXETINE HCL 40 MG PO CAPS: 40.0000 mg | ORAL_CAPSULE | Freq: Two times a day (BID) | ORAL | 0 refills | Status: DC

## 2016-11-17 NOTE — Progress Notes (Signed)
   HPI  CC: Follow-up depression Patient is here to follow-up on her depression. She states that she feels better since her last visit. She has been taking the prescribed Prozac 1 capsule daily without any reported adverse side effects. She denies any SI/HI. She endorses previous "hopelessness" but states that this is no longer present. She does endorse some issues with sleep but does not believe that this is related to this medication as it was happening some before initiating this medication.  Patient states that she continues to have some abdominal discomfort. She states that it feels as if "something is wiggling inside me". She states that she "feels pregnant". And would like to be tested today. Patient states that she has not been actively trying to get pregnant and uses barrier contraception with intercourse. Her last episode of intercourse was 1-2 days ago. Her last menstrual period was approximately 2.5-3 weeks ago. She denies any current vaginal spotting or bleeding.  Patient denies any fevers, chills, appetite changes, SI/HI, nausea, vomiting, diarrhea, dysuria, or dyspareunia.  Review of Systems See HPI for ROS.   CC, SH/smoking status, and VS noted  Objective: BP (!) 148/90   Pulse 99   Temp 99.2 F (37.3 C) (Oral)   Ht 5\' 1"  (1.549 m)   Wt 174 lb (78.9 kg)   SpO2 99%   BMI 32.88 kg/m  Gen: NAD, alert, cooperative, and anxious appearing CV: RRR, no murmur Resp: CTAB, no wheezes, non-labored Abd: Obese, SNTND, BS present, no guarding or organomegaly Neuro: Alert and oriented, Speech clear, No gross deficits   Assessment and plan:  Recurrent major depression-severe Stable: Patient is here to follow-up on her depression. She states that she feels better overall. Her PHQ 9 was increased to 22 (20 previously). Patient denied any HI/SI. She endorses good compliance with her newly prescribed fluoxetine. - Increase fluoxetine to 80 mg/day - Follow-up one month w/  PCP  Amenorrhea Patient had initially reported amenorrhea to nursing. However after further discussion patient had her menstrual cycle approximately 3 weeks ago and is not due for another week. Complaints of abdominal distention and the feeling of a foreign object moving around in her abdomen is most likely bowel gas. Urine pregnancy negative today. - Reassurance provided.   Orders Placed This Encounter  Procedures  . POCT urine pregnancy    Meds ordered this encounter  Medications  . FLUoxetine (PROZAC) 40 MG capsule    Sig: Take 1 capsule (40 mg total) by mouth 2 (two) times daily.    Dispense:  60 capsule    Refill:  0     Kathee DeltonIan D Annai Heick, MD,MS,  PGY3 11/17/2016 2:21 PM

## 2016-11-17 NOTE — Assessment & Plan Note (Signed)
Stable: Patient is here to follow-up on her depression. She states that she feels better overall. Her PHQ 9 was increased to 22 (20 previously). Patient denied any HI/SI. She endorses good compliance with her newly prescribed fluoxetine. - Increase fluoxetine to 80 mg/day - Follow-up one month w/ PCP

## 2016-11-17 NOTE — Assessment & Plan Note (Signed)
Patient had initially reported amenorrhea to nursing. However after further discussion patient had her menstrual cycle approximately 3 weeks ago and is not due for another week. Complaints of abdominal distention and the feeling of a foreign object moving around in her abdomen is most likely bowel gas. Urine pregnancy negative today. - Reassurance provided.

## 2016-11-17 NOTE — Patient Instructions (Addendum)
It was a pleasure seeing you today in our clinic. Today we discussed your depression and anxiety. Here is the treatment plan we have discussed and agreed upon together:   - I've increase the dosage of Prozac to 1 tablet twice a day. - If you have any feelings of harming yourself or others do not hesitate to contact our office or go to the nearest emergency room to talk to someone. - I would like to have you follow-up with your new primary care provider, Dr. Primitivo GauzeFletcher, in 2-4 weeks. If you have any changes, for the worse, in your depression/anxiety please contact our office to schedule an appointment sooner than that.

## 2016-11-17 NOTE — Progress Notes (Signed)
URIN 

## 2016-12-05 ENCOUNTER — Encounter (HOSPITAL_COMMUNITY): Payer: Self-pay | Admitting: *Deleted

## 2016-12-05 ENCOUNTER — Inpatient Hospital Stay (HOSPITAL_COMMUNITY)
Admission: AD | Admit: 2016-12-05 | Discharge: 2016-12-05 | Disposition: A | Discharge status: Home / Self Care | Payer: Medicaid Other | Source: Ambulatory Visit | Attending: Obstetrics & Gynecology | Admitting: Obstetrics & Gynecology

## 2016-12-05 DIAGNOSIS — R109 Unspecified abdominal pain: Secondary | ICD-10-CM | POA: Insufficient documentation

## 2016-12-05 DIAGNOSIS — Z3202 Encounter for pregnancy test, result negative: Secondary | ICD-10-CM

## 2016-12-05 DIAGNOSIS — N926 Irregular menstruation, unspecified: Secondary | ICD-10-CM | POA: Insufficient documentation

## 2016-12-05 DIAGNOSIS — Z87891 Personal history of nicotine dependence: Secondary | ICD-10-CM | POA: Insufficient documentation

## 2016-12-05 LAB — URINALYSIS, ROUTINE W REFLEX MICROSCOPIC
Bacteria, UA: NONE SEEN
Bilirubin Urine: NEGATIVE
GLUCOSE, UA: NEGATIVE mg/dL
Hgb urine dipstick: NEGATIVE
KETONES UR: NEGATIVE mg/dL
Leukocytes, UA: NEGATIVE
Nitrite: NEGATIVE
PH: 6 (ref 5.0–8.0)
Protein, ur: 30 mg/dL — AB
SPECIFIC GRAVITY, URINE: 1.016 (ref 1.005–1.030)

## 2016-12-05 LAB — WET PREP, GENITAL
SPERM: NONE SEEN
Trich, Wet Prep: NONE SEEN
Yeast Wet Prep HPF POC: NONE SEEN

## 2016-12-05 LAB — POCT PREGNANCY, URINE: Preg Test, Ur: NEGATIVE

## 2016-12-05 NOTE — MAU Note (Signed)
Pt report she has been having  Pelvic pressure for the last 2 days and felt like she was going to pass out all day. Stated she has only had spotting the last 2 periods she has had. Last time was about a week ago.

## 2016-12-05 NOTE — Discharge Instructions (Signed)

## 2016-12-05 NOTE — MAU Provider Note (Signed)
History     CSN: [id number]  Arrival date and time: 12/05/16 [id number]   First Provider Initiated Contact with Patient 12/05/16 0451      Chief Complaint  Patient presents with  . Abdominal Pain  . Dizziness   HPI Theresa Olson is a 46 y.o. [id number] non pregnant female who presents with intermittent lower abdominal pain. She says the pain has been going on "for weeks." She denies any pain currently and says it "just comes sometimes." She wants to know if she is pregnant, has not taken pregnancy test at home. She denies any vaginal bleeding or discharge. She reports LMP 11/27/2016. She reports the last two periods have been irregular with just spotting. [facility] in June for the same complaints.    OB History    Gravida Para Term Preterm AB Living   8 5 5   3 5    SAB TAB Ectopic Multiple Live Births   3       5      Past Medical History:  Diagnosis Date  . Anxiety   . Depression   . Hypertension     Past Surgical History:  Procedure Laterality Date  . CESAREAN SECTION      No family history on file.  Social History  Substance Use Topics  . Smoking status: Former Smoker    Types: Cigarettes    Quit date: 08/28/2016  . Smokeless tobacco: Never Used  . Alcohol use Yes     Comment: 2-3 glasses of vodka per day    Allergies: No Known Allergies  Prescriptions Prior to Admission  Medication Sig Dispense Refill Last Dose  . FLUoxetine (PROZAC) 40 MG capsule Take 1 capsule (40 mg total) by mouth 2 (two) times daily. 60 capsule 0   . prenatal vitamin w/FE, FA (PRENATAL 1 + 1) 27-1 MG TABS tablet Take 1 tablet by mouth daily at 12 noon. 90 each 2   . vitamin C (ASCORBIC ACID) 500 MG tablet Take 500 mg by mouth every morning.   Taking    Review of Systems  Constitutional: Negative.  Negative for chills and fever.  HENT: Negative.   Respiratory: Negative.  Negative for shortness of breath.   Cardiovascular: Negative.  Negative  for chest pain.  Gastrointestinal: Negative for abdominal pain, constipation, diarrhea, nausea and vomiting.  Genitourinary: Negative.  Negative for dysuria, vaginal bleeding and vaginal discharge.  Neurological: Negative.  Negative for dizziness and headaches.  Psychiatric/Behavioral: Negative.    Physical Exam   Blood pressure (!) 157/99, pulse (!) 102, temperature 98.3 F (36.8 C), temperature source Oral, resp. rate 18, last menstrual period 11/27/2016.  Physical Exam  Nursing note and vitals reviewed. Constitutional: She is oriented to person, place, and time. She appears well-developed and well-nourished.  HENT:  Head: Normocephalic and atraumatic.  Eyes: Conjunctivae are normal. No scleral icterus.  Cardiovascular: Normal rate, regular rhythm and normal heart sounds.   Respiratory: Effort normal. No respiratory distress.  GI: Soft. She exhibits no distension. There is no tenderness.  Genitourinary: Vagina normal. Cervix exhibits no motion tenderness. No bleeding in the vagina. No vaginal discharge found.  Neurological: She is alert and oriented to person, place, and time.  Skin: Skin is warm and dry.  Psychiatric: She has a normal mood and affect. Her behavior is normal. Judgment and thought content normal.   Bimanual exam: Cervix 0/long/high, firm, anterior, neg CMT, uterus nontender, nonenlarged, adnexa without tenderness, enlargement,  or mass  MAU Course  Procedures Results for orders placed or performed during the hospital encounter of 12/05/16 (from the past 24 hour(s))  Urinalysis, Routine w reflex microscopic     Status: Abnormal   Collection Time: 12/05/16  4:20 AM  Result Value Ref Range   Color, Urine YELLOW YELLOW   APPearance HAZY (A) CLEAR   Specific Gravity, Urine 1.016 1.005 - 1.030   pH 6.0 5.0 - 8.0   Glucose, UA NEGATIVE NEGATIVE mg/dL   Hgb urine dipstick NEGATIVE NEGATIVE   Bilirubin Urine NEGATIVE NEGATIVE   Ketones, ur NEGATIVE NEGATIVE mg/dL    Protein, ur 30 (A) NEGATIVE mg/dL   Nitrite NEGATIVE NEGATIVE   Leukocytes, UA NEGATIVE NEGATIVE   RBC / HPF 0-5 0 - 5 RBC/hpf   WBC, UA 0-5 0 - 5 WBC/hpf   Bacteria, UA NONE SEEN NONE SEEN   Squamous Epithelial / LPF 6-30 (A) NONE SEEN   Mucous PRESENT   Pregnancy, urine POC     Status: None   Collection Time: 12/05/16  4:38 AM  Result Value Ref Range   Preg Test, Ur NEGATIVE NEGATIVE  Wet prep, genital     Status: Abnormal   Collection Time: 12/05/16  5:00 AM  Result Value Ref Range   Yeast Wet Prep HPF POC NONE SEEN NONE SEEN   Trich, Wet Prep NONE SEEN NONE SEEN   Clue Cells Wet Prep HPF POC PRESENT (A) NONE SEEN   WBC, Wet Prep HPF POC FEW (A) NONE SEEN   Sperm NONE SEEN    MDM UA, UPT Wet prep and gc/chlamydia Assessment and Plan   1. Pregnancy examination or test, negative result   2. Menses, irregular    -Discharge patient home in stable condition -Reassurance provided for irregular cycles -Follow up with [facility] -Encouraged to return here or to other Urgent Care/ED if she develops worsening of symptoms, increase in pain, fever, or other concerning symptoms.   Cleone Slim SNM 12/05/2016, 5:17 AM   I confirm that I have verified the information documented in the nurse midwife student's note and that I have also personally reperformed the physical exam and all medical decision making activities.   Thressa Sheller 5:31 AM 12/05/16

## 2016-12-06 LAB — GC/CHLAMYDIA PROBE AMP (~~LOC~~) NOT AT ARMC
Chlamydia: NEGATIVE
Neisseria Gonorrhea: NEGATIVE

## 2016-12-07 ENCOUNTER — Encounter (HOSPITAL_COMMUNITY): Payer: Self-pay | Admitting: Emergency Medicine

## 2016-12-07 ENCOUNTER — Emergency Department (HOSPITAL_COMMUNITY)
Admission: EM | Admit: 2016-12-07 | Discharge: 2016-12-07 | Disposition: A | Discharge status: Home / Self Care | Payer: Medicaid Other | Attending: Emergency Medicine | Admitting: Emergency Medicine

## 2016-12-07 ENCOUNTER — Emergency Department (HOSPITAL_COMMUNITY): Payer: Medicaid Other

## 2016-12-07 DIAGNOSIS — I10 Essential (primary) hypertension: Secondary | ICD-10-CM | POA: Diagnosis not present

## 2016-12-07 DIAGNOSIS — R42 Dizziness and giddiness: Secondary | ICD-10-CM | POA: Diagnosis not present

## 2016-12-07 DIAGNOSIS — R110 Nausea: Secondary | ICD-10-CM | POA: Insufficient documentation

## 2016-12-07 DIAGNOSIS — R030 Elevated blood-pressure reading, without diagnosis of hypertension: Secondary | ICD-10-CM | POA: Diagnosis not present

## 2016-12-07 DIAGNOSIS — Z79899 Other long term (current) drug therapy: Secondary | ICD-10-CM | POA: Insufficient documentation

## 2016-12-07 DIAGNOSIS — Z87891 Personal history of nicotine dependence: Secondary | ICD-10-CM | POA: Insufficient documentation

## 2016-12-07 LAB — URINALYSIS, ROUTINE W REFLEX MICROSCOPIC
BILIRUBIN URINE: NEGATIVE
GLUCOSE, UA: NEGATIVE mg/dL
HGB URINE DIPSTICK: NEGATIVE
Ketones, ur: 5 mg/dL — AB
NITRITE: NEGATIVE
Protein, ur: 100 mg/dL — AB
SPECIFIC GRAVITY, URINE: 1.032 — AB (ref 1.005–1.030)
pH: 5 (ref 5.0–8.0)

## 2016-12-07 LAB — CBC WITH DIFFERENTIAL/PLATELET
BASOS PCT: 1 %
Basophils Absolute: 0.1 10*3/uL (ref 0.0–0.1)
EOS ABS: 0.1 10*3/uL (ref 0.0–0.7)
EOS PCT: 1 %
HCT: 39.3 % (ref 36.0–46.0)
HEMOGLOBIN: 13.6 g/dL (ref 12.0–15.0)
Lymphocytes Relative: 28 %
Lymphs Abs: 3.3 10*3/uL (ref 0.7–4.0)
MCH: 31.1 pg (ref 26.0–34.0)
MCHC: 34.6 g/dL (ref 30.0–36.0)
MCV: 89.7 fL (ref 78.0–100.0)
MONOS PCT: 9 %
Monocytes Absolute: 1.1 10*3/uL — ABNORMAL HIGH (ref 0.1–1.0)
NEUTROS PCT: 61 %
Neutro Abs: 7.3 10*3/uL (ref 1.7–7.7)
PLATELETS: 272 10*3/uL (ref 150–400)
RBC: 4.38 MIL/uL (ref 3.87–5.11)
RDW: 13.8 % (ref 11.5–15.5)
WBC: 11.9 10*3/uL — AB (ref 4.0–10.5)

## 2016-12-07 LAB — I-STAT CHEM 8, ED
BUN: 15 mg/dL (ref 6–20)
CALCIUM ION: 1.17 mmol/L (ref 1.15–1.40)
Chloride: 101 mmol/L (ref 101–111)
Creatinine, Ser: 0.9 mg/dL (ref 0.44–1.00)
Glucose, Bld: 125 mg/dL — ABNORMAL HIGH (ref 65–99)
HEMATOCRIT: 43 % (ref 36.0–46.0)
HEMOGLOBIN: 14.6 g/dL (ref 12.0–15.0)
Potassium: 3.7 mmol/L (ref 3.5–5.1)
SODIUM: 139 mmol/L (ref 135–145)
TCO2: 26 mmol/L (ref 0–100)

## 2016-12-07 LAB — HEPATIC FUNCTION PANEL
ALK PHOS: 68 U/L (ref 38–126)
ALT: 14 U/L (ref 14–54)
AST: 25 U/L (ref 15–41)
Albumin: 4.1 g/dL (ref 3.5–5.0)
BILIRUBIN INDIRECT: 0.4 mg/dL (ref 0.3–0.9)
Bilirubin, Direct: 0.1 mg/dL (ref 0.1–0.5)
Total Bilirubin: 0.5 mg/dL (ref 0.3–1.2)
Total Protein: 8.1 g/dL (ref 6.5–8.1)

## 2016-12-07 LAB — I-STAT BETA HCG BLOOD, ED (MC, WL, AP ONLY)

## 2016-12-07 NOTE — Discharge Instructions (Signed)
As we discussed you to need a follow-up with her primary care doctor for reevaluation of your blood pressure.   Return the emergency Department for any worsening symptoms, chest pain, difficulty breathing, nausea/vomiting, abdominal pain, fever or any other worsening or concerning symptoms.

## 2016-12-07 NOTE — ED Provider Notes (Signed)
[facility]-EMERGENCY DEPT Provider Note   CSN: [id number] Arrival date & time: 12/07/16  1542     History   Chief Complaint No chief complaint on file.   HPI Theresa Olson is a 46 y.o. female 2 months of intermittent lightheadedness. She reports some associated nausea but no vomiting. She reports that this is been ongoing issue for the past 2 months. She has sought evaluation by her primary care doctor in the department until the hospital. Patient is concerned that she might be pregnant . This is exactly how she felt when she was pregnant.. Her pregnancy test done at the [facility] on 12/05/16 but is worried that it was falsely negative. Patient reports that the episodes of lightheadedness occur randomly and not associated with any exertion. Patient denies any loss of consciousness or syncopal episodes. She has been able to ambulate without any difficulty. Patient is also concerned because she has had  Some blood pressure readings that were elevated when she has been evaluated for lightheadedness. Patient does not have a history of hypertension and does not take any medication hypertension.She reports that she was last sexually active 2 months ago. She has not had any other sexual intercourse. Patient denies any vaginal discharge, vaginal bleeding, fevers, numbness/weakness of her arms or legs, chest pain, difficulty breathing.    The history is provided by the patient.    Past Medical History:  Diagnosis Date  . Anxiety   . Depression   . Hypertension     Patient Active Problem List   Diagnosis Date Noted  . Amenorrhea 11/17/2016  . Frequent No-show for appointment 08/11/2016  . Recurrent major depression-severe (HCC) 11/02/2011  . TOBACCO USER 03/07/2009  . ANXIETY DISORDER, GENERALIZED 08/20/2008    Past Surgical History:  Procedure Laterality Date  . CESAREAN SECTION      OB History    Gravida Para Term Preterm AB Living   8 5 5   3 5    SAB TAB Ectopic Multiple Live  Births   3       5       Home Medications    Prior to Admission medications   Medication Sig Start Date End Date Taking? Authorizing Provider  FLUoxetine (PROZAC) 40 MG capsule Take 1 capsule (40 mg total) by mouth 2 (two) times daily. Patient taking differently: Take 80 mg by mouth daily.  11/17/16  Yes McKeag, Janine Ores, MD  Methylsulfonylmethane (MSM) 1000 MG CAPS Take 1,000 mg by mouth daily.   Yes [provider]  OVER THE COUNTER MEDICATION Take 1 tablet by mouth daily. Anti-aging OTC   Yes [provider]  PARoxetine (PAXIL) 30 MG tablet Take 1 tablet by mouth once 10/06/16  Yes [provider]  prenatal vitamin w/FE, FA (PRENATAL 1 + 1) 27-1 MG TABS tablet Take 1 tablet by mouth daily at 12 noon. 10/31/16  Yes Joanna Puff, MD  vitamin C (ASCORBIC ACID) 500 MG tablet Take 500 mg by mouth every morning.   Yes [provider]    Family History History reviewed. No pertinent family history.  Social History Social History  Substance Use Topics  . Smoking status: Former Smoker    Types: Cigarettes    Quit date: 08/28/2016  . Smokeless tobacco: Never Used  . Alcohol use Yes     Comment: 2-3 glasses of vodka per day     Allergies   Patient has no known allergies.   Review of Systems Review of Systems  Constitutional: Negative for chills and fever.  HENT: Negative for congestion and sore throat.   Eyes: Negative for visual disturbance.  Respiratory: Negative for cough and shortness of breath.   Cardiovascular: Negative for chest pain.  Gastrointestinal: Positive for nausea. Negative for abdominal pain, diarrhea and vomiting.  Genitourinary: Negative for dysuria, hematuria, vaginal bleeding and vaginal discharge.  Neurological: Positive for light-headedness. Negative for dizziness, weakness, numbness and headaches.     Physical Exam Updated Vital Signs BP (!) 152/103   Pulse 77   Temp 98.8 F (37.1 C) (Oral)   Resp 12   LMP  11/27/2016 (Within Weeks)   SpO2 96%   Physical Exam  Constitutional: She is oriented to person, place, and time. She appears well-developed and well-nourished.  Sitting comfortably on examination table  HENT:  Head: Normocephalic and atraumatic.  Mouth/Throat: Oropharynx is clear and moist and mucous membranes are normal.  Eyes: Pupils are equal, round, and reactive to light. Conjunctivae, EOM and lids are normal.  Neck: Full passive range of motion without pain.  Cardiovascular: Normal rate, regular rhythm, normal heart sounds and normal pulses.  Exam reveals no gallop and no friction rub.   No murmur heard. Pulmonary/Chest: Effort normal and breath sounds normal.  Abdominal: Soft. Normal appearance and bowel sounds are normal. There is no tenderness. There is no rigidity and no guarding.  Abdomen is soft, nontender, nondistended. No evidence of gravid abdomen.  Musculoskeletal: Normal range of motion.  Neurological: She is alert and oriented to person, place, and time. GCS eye subscore is 4. GCS verbal subscore is 5. GCS motor subscore is 6.  Cranial nerves III-XII intact Follows commands, Moves all extremities  5/5 strength to BUE and BLE  Sensation intact throughout  Normal finger to nose. No dysdiadochokinesia. No pronator drift No slurred speech. No facial droop.   Skin: Skin is warm and dry. Capillary refill takes less than 2 seconds.  Psychiatric: She has a normal mood and affect. Her speech is normal.  Nursing note and vitals reviewed.    ED Treatments / Results  Labs (all labs ordered are listed, but only abnormal results are displayed) Labs Reviewed  CBC WITH DIFFERENTIAL/PLATELET - Abnormal; Notable for the following:       Result Value   WBC 11.9 (*)    Monocytes Absolute 1.1 (*)    All other components within normal limits  URINALYSIS, ROUTINE W REFLEX MICROSCOPIC - Abnormal; Notable for the following:    APPearance CLOUDY (*)    Specific Gravity, Urine 1.032  (*)    Ketones, ur 5 (*)    Protein, ur 100 (*)    Leukocytes, UA TRACE (*)    Bacteria, UA RARE (*)    Squamous Epithelial / LPF 6-30 (*)    All other components within normal limits  I-STAT CHEM 8, ED - Abnormal; Notable for the following:    Glucose, Bld 125 (*)    All other components within normal limits  HEPATIC FUNCTION PANEL  I-STAT BETA HCG BLOOD, ED ([facility], [facility], [facility] ONLY)    EKG  EKG Interpretation  Date/Time:  Wednesday December 07 2016 20:09:38 EDT Ventricular Rate:  83 PR Interval:    QRS Duration: 81 QT Interval:  381 QTC Calculation: 448 R Axis:   61 Text Interpretation:  Sinus rhythm Confirmed by Donnetta Hutchingook, Brian ([id number]) on 12/08/2016 1:04:26 PM       Radiology No results found.  Procedures Procedures (including critical care time)  Medications Ordered in ED  Medications - No data to display   Initial Impression / Assessment and Plan / ED Course  I have reviewed the triage vital signs and the nursing notes.  Pertinent labs & imaging results that were available during my care of the patient were reviewed by me and considered in my medical decision making (see chart for details).     46 year old female who presents with 2 months of lightheadedness. Has had multiple evaluations for same symptoms. Patient is concerned might be pregnant felt like this before with her previous pregnancies. Recently seen at [facility] on 12/04/16 for same symptoms. Had a negative pregnancy test at that time. Patient is concerned that the pregnancy test was falsely negative. Patient is afebrile, non-toxic appearing, sitting comfortably on examination table. Vital signs reviewed. Consider dehydration versus orthostatic hypertension versus hypoglycemia. Also consider pregnancy (recent negative pregnancy test, low suspicion. Will plan to repeat pregnancy test in department. Patient is slightly hypertensive. Initial labs including i-STAT beta, CBC, UA, i-STAT chem 8, EKG ordered at triage. Will  order additional hepatic function panel.   Labs reviewed. EKG shows sinus rhythm rate 83. CBC with leukocytosis, likely secondary to stress reaction. I-STAT beta is negative. BMP shows hyperglycemia. UA has some trace leukocytes but otherwise appears to be contaminated.. Hepatic function panel is unremarkable. Patient is still hypertensive on exam. Orthostatic vitals pending.  Orthostatic vitals negative. Patient has been able to ambulate in the department without any difficulty. She is tolerating fluids without any difficulty. Instructed that she is follow-up with her primary care doctor regarding hypertension. Will not plan to start her on any medications in the department. Strict return precautions discussed. Patient expresses understanding and agreement to plan.    Final Clinical Impressions(s) / ED Diagnoses   Final diagnoses:  Lightheaded  Elevated blood pressure reading    New Prescriptions Discharge Medication List as of 12/07/2016  8:49 PM       Maxwell Caul, PA-C 12/10/16 1641    Lorre Nick, MD 12/10/16 2316

## 2016-12-07 NOTE — ED Triage Notes (Signed)
Came in with complaint of light headedness for several days now, pt. Also wanted to be checked for possible pregnancy, w/ Nausea but no vomiting . Also reported of having high blood pressures but not on any medicines.

## 2016-12-12 ENCOUNTER — Ambulatory Visit: Payer: Medicaid Other | Admitting: Internal Medicine

## 2016-12-14 ENCOUNTER — Telehealth: Payer: Self-pay | Admitting: Family Medicine

## 2016-12-14 NOTE — Telephone Encounter (Signed)
Pt is concerned about her bp. Yesterday it was 130/107. She has an appt on Tuesday but wants to know what she can do to lower it before then. She also hasnt had a period since June 8.  She feels pregnant ( July 9 pregnancy test was negative).  She wonders if this is menopause. She would like to talk to someone about this

## 2016-12-18 ENCOUNTER — Emergency Department (HOSPITAL_COMMUNITY): Payer: Medicaid Other

## 2016-12-18 ENCOUNTER — Encounter (HOSPITAL_COMMUNITY): Payer: Self-pay | Admitting: Emergency Medicine

## 2016-12-18 ENCOUNTER — Emergency Department (HOSPITAL_COMMUNITY)
Admission: EM | Admit: 2016-12-18 | Discharge: 2016-12-18 | Disposition: A | Discharge status: Home / Self Care | Payer: Medicaid Other | Attending: Emergency Medicine | Admitting: Emergency Medicine

## 2016-12-18 ENCOUNTER — Other Ambulatory Visit: Payer: Self-pay

## 2016-12-18 DIAGNOSIS — R5383 Other fatigue: Secondary | ICD-10-CM | POA: Diagnosis not present

## 2016-12-18 DIAGNOSIS — G4700 Insomnia, unspecified: Secondary | ICD-10-CM | POA: Insufficient documentation

## 2016-12-18 DIAGNOSIS — R519 Headache, unspecified: Secondary | ICD-10-CM

## 2016-12-18 DIAGNOSIS — R42 Dizziness and giddiness: Secondary | ICD-10-CM | POA: Diagnosis not present

## 2016-12-18 DIAGNOSIS — Z87891 Personal history of nicotine dependence: Secondary | ICD-10-CM | POA: Diagnosis not present

## 2016-12-18 DIAGNOSIS — I10 Essential (primary) hypertension: Secondary | ICD-10-CM | POA: Diagnosis not present

## 2016-12-18 DIAGNOSIS — R51 Headache: Secondary | ICD-10-CM | POA: Diagnosis not present

## 2016-12-18 DIAGNOSIS — Z79899 Other long term (current) drug therapy: Secondary | ICD-10-CM | POA: Insufficient documentation

## 2016-12-18 LAB — URINALYSIS, ROUTINE W REFLEX MICROSCOPIC
Bacteria, UA: NONE SEEN
Bilirubin Urine: NEGATIVE
Glucose, UA: NEGATIVE mg/dL
Ketones, ur: NEGATIVE mg/dL
Leukocytes, UA: NEGATIVE
Nitrite: NEGATIVE
Protein, ur: 30 mg/dL — AB
Specific Gravity, Urine: 1.017 (ref 1.005–1.030)
pH: 7 (ref 5.0–8.0)

## 2016-12-18 LAB — BASIC METABOLIC PANEL
Anion gap: 8 (ref 5–15)
BUN: 7 mg/dL (ref 6–20)
CO2: 24 mmol/L (ref 22–32)
Calcium: 9.1 mg/dL (ref 8.9–10.3)
Chloride: 107 mmol/L (ref 101–111)
Creatinine, Ser: 0.86 mg/dL (ref 0.44–1.00)
GFR calc Af Amer: >60 mL/min (ref >60)
GFR calc non Af Amer: >60 mL/min (ref >60)
Glucose, Bld: 142 mg/dL — ABNORMAL HIGH (ref 65–99)
Potassium: 3.6 mmol/L (ref 3.5–5.1)
Sodium: 139 mmol/L (ref 135–145)

## 2016-12-18 LAB — CBC
HCT: 37.7 % (ref 36.0–46.0)
Hemoglobin: 12.9 g/dL (ref 12.0–15.0)
MCH: 31.1 pg (ref 26.0–34.0)
MCHC: 34.2 g/dL (ref 30.0–36.0)
MCV: 90.8 fL (ref 78.0–100.0)
Platelets: 277 10*3/uL (ref 150–400)
RBC: 4.15 MIL/uL (ref 3.87–5.11)
RDW: 13.9 % (ref 11.5–15.5)
WBC: 8.8 10*3/uL (ref 4.0–10.5)

## 2016-12-18 LAB — I-STAT BETA HCG BLOOD, ED (NOT ORDERABLE): I-stat hCG, quantitative: <5 m[IU]/mL (ref <5)

## 2016-12-18 LAB — POCT I-STAT TROPONIN I: Troponin i, poc: 0 ng/mL (ref 0.00–0.08)

## 2016-12-18 LAB — LIPASE, BLOOD: Lipase: 37 U/L (ref 11–51)

## 2016-12-18 MED ORDER — LORAZEPAM 0.5 MG PO TABS
0.5000 mg | ORAL_TABLET | Freq: Once | ORAL | Status: AC
  Administered 2016-12-18: 0.5 mg via ORAL
  Filled 2016-12-18: qty 1

## 2016-12-18 MED ORDER — IBUPROFEN 200 MG PO TABS
600.0000 mg | ORAL_TABLET | Freq: Once | ORAL | Status: AC
  Administered 2016-12-18: 600 mg via ORAL
  Filled 2016-12-18: qty 3

## 2016-12-18 NOTE — ED Notes (Signed)
Pt states she knows that her blood pressure is high as she has been to ER several times over the past week for it. She has an appointment with PCP this upcoming Tuesday for evaluation. Reports "it seems like since I have been on Paxil it has been up."

## 2016-12-18 NOTE — ED Notes (Signed)
ED Provider at bedside. 

## 2016-12-18 NOTE — ED Triage Notes (Signed)
Pt reports lightheadedness for the past 2 months. Denies any recent changes in symptoms, but does endorse CP and SOB. Pt also complains of vaginal spotting and abd swelling as well.

## 2016-12-18 NOTE — ED Provider Notes (Signed)
[facility]-EMERGENCY DEPT Provider Note   CSN: [id number] Arrival date & time: 12/18/16  1815   By signing my name below, I, Soijett Blue, attest that this documentation has been prepared under the direction and in the presence of Raeford RazorKohut, Trong Gosling, MD. Electronically Signed: Soijett Blue, ED Scribe. 12/18/16. 9:36 PM.  History   Chief Complaint Chief Complaint  Patient presents with  . Dizziness    HPI Theresa Olson is a 46 y.o. female with a PMHx of HTN, anxiety, who presents to the Emergency Department complaining of lightheadedness onset 2 months ago. Pt reports associated HA, insomnia, 3 irregular menstrual cycles, and fatigue. Pt has tried Rx prozac with no relief of her symptoms. Pt states that her PCP of 2 years recently left the practice following switching the pt medications. Pt reports that she thinks that her medications were changed due to informing her PCP that she maybe pregnant. Pt states that she was prescribed prenatal vitamins and proxac with her last PCP visit 2 months ago. She notes that she has a follow up appointment with her new PCP in 2 days. She states that her symptoms began following her paxil being changed to 80 mg prozac and that she has had elevated blood pressure as well. Pt reports that she is unsure of the medication change. She denies hot flashes and any other symptoms.      The history is provided by the patient. No language interpreter was used.    Past Medical History:  Diagnosis Date  . Anxiety   . Depression   . Hypertension     Patient Active Problem List   Diagnosis Date Noted  . Amenorrhea 11/17/2016  . Frequent No-show for appointment 08/11/2016  . Recurrent major depression-severe (HCC) 11/02/2011  . TOBACCO USER 03/07/2009  . ANXIETY DISORDER, GENERALIZED 08/20/2008    Past Surgical History:  Procedure Laterality Date  . CESAREAN SECTION      OB History    Gravida Para Term Preterm AB Living   8 5 5   3 5    SAB TAB Ectopic  Multiple Live Births   3       5       Home Medications    Prior to Admission medications   Medication Sig Start Date End Date Taking? Authorizing Provider  FLUoxetine (PROZAC) 40 MG capsule Take 1 capsule (40 mg total) by mouth 2 (two) times daily. Patient taking differently: Take 80 mg by mouth daily.  11/17/16   McKeag, Janine OresIan D, MD  Methylsulfonylmethane (MSM) 1000 MG CAPS Take 1,000 mg by mouth daily.    [provider]  OVER THE COUNTER MEDICATION Take 1 tablet by mouth daily. Anti-aging OTC    [provider]  PARoxetine (PAXIL) 30 MG tablet Take 1 tablet by mouth once 10/06/16   [provider]  prenatal vitamin w/FE, FA (PRENATAL 1 + 1) 27-1 MG TABS tablet Take 1 tablet by mouth daily at 12 noon. 10/31/16   Joanna Pufforsey, Crystal S, MD  vitamin C (ASCORBIC ACID) 500 MG tablet Take 500 mg by mouth every morning.    [provider]    Family History History reviewed. No pertinent family history.  Social History Social History  Substance Use Topics  . Smoking status: Former Smoker    Types: Cigarettes    Quit date: 08/28/2016  . Smokeless tobacco: Never Used  . Alcohol use Yes     Comment: 2-3 glasses of vodka per day     Allergies  Patient has no known allergies.   Review of Systems Review of Systems  Constitutional: Positive for fatigue. Negative for diaphoresis.  Neurological: Positive for light-headedness and headaches.     Physical Exam Updated Vital Signs BP (!) 168/94 (BP Location: Right Arm)   Pulse 78   Temp 98.6 F (37 C) (Oral)   Resp 18   Wt 167 lb (75.8 kg)   LMP 12/18/2016   SpO2 100%   BMI 31.55 kg/m   Physical Exam  Constitutional: She is oriented to person, place, and time. She appears well-developed and well-nourished. No distress.  HENT:  Head: Normocephalic and atraumatic.  Eyes: EOM are normal.  Neck: Neck supple.  Cardiovascular: Normal rate, regular rhythm and normal heart sounds.  Exam reveals no gallop  and no friction rub.   No murmur heard. Pulmonary/Chest: Effort normal and breath sounds normal. No respiratory distress. She has no wheezes. She has no rales.  Abdominal: Soft. She exhibits no distension. There is no tenderness.  Musculoskeletal: Normal range of motion.  Neurological: She is alert and oriented to person, place, and time.  Skin: Skin is warm and dry.  Psychiatric: Her behavior is normal. Her mood appears anxious.  Very anxious. Tearful at times.   Nursing note and vitals reviewed.    ED Treatments / Results  DIAGNOSTIC STUDIES: Oxygen Saturation is 100% on RA, nl by my interpretation.    COORDINATION OF CARE: 9:35 PM Discussed treatment plan with pt at bedside and pt agreed to plan.   Labs (all labs ordered are listed, but only abnormal results are displayed) Labs Reviewed  BASIC METABOLIC PANEL - Abnormal; Notable for the following:       Result Value   Glucose, Bld 142 (*)    All other components within normal limits  CBC  LIPASE, BLOOD  URINALYSIS, ROUTINE W REFLEX MICROSCOPIC  I-STAT TROPONIN, ED  I-STAT BETA HCG BLOOD, ED ([facility], [facility], [facility] ONLY)  POCT I-STAT TROPONIN I  I-STAT BETA HCG BLOOD, ED (NOT ORDERABLE)    EKG  EKG Interpretation  Date/Time:  Sunday December 18 2016 18:57:29 EDT Ventricular Rate:  84 PR Interval:    QRS Duration: 82 QT Interval:  366 QTC Calculation: 433 R Axis:   46 Text Interpretation:  Sinus rhythm Probable left atrial enlargement No significant change since last tracing Confirmed by ,  ([id number]) on 12/18/2016 9:02:05 PM       Radiology Dg Chest 2 View  Result Date: 12/18/2016 CLINICAL DATA:  Central chest pain. Pain for 1 month. Shortness of breath. EXAM: CHEST  2 VIEW COMPARISON:  None. FINDINGS: The cardiomediastinal contours are normal. The lungs are clear. Pulmonary vasculature is normal. No consolidation, pleural effusion, or pneumothorax. No acute osseous abnormalities are seen. IMPRESSION: No active  cardiopulmonary disease. Electronically Signed   By: Melanie  Ehinger M.D.   On: 12/18/2016 19:16    Procedures Procedures (including critical care time)  Medications Ordered in ED Medications - No data to display   Initial Impression / Assessment and Plan / ED Course  I have reviewed the triage vital signs and the nursing notes.  Pertinent labs & imaging results that were available during my care of the patient were reviewed by me and considered in my medical decision making (see chart for details).     46 yF with multiple complaints. I think a lot of this is anxiety. She also may be going through menopause. I doubt emergent process.   Final Clinical Impressions(s) / ED Diagnoses  Final diagnoses:  Dizziness  Acute nonintractable headache, unspecified headache type    New Prescriptions New Prescriptions   No medications on file   I personally preformed the services scribed in my presence. The recorded information has been reviewed is accurate. Raeford Razor, MD.     Raeford Razor, MD 12/25/16 1057

## 2016-12-19 NOTE — Telephone Encounter (Signed)
Called patient, no answer, no voicemail. Of note, patient was seen in the ED over the weekend for this issue.

## 2016-12-20 ENCOUNTER — Ambulatory Visit (INDEPENDENT_AMBULATORY_CARE_PROVIDER_SITE_OTHER): Payer: Medicaid Other | Admitting: Family Medicine

## 2016-12-20 ENCOUNTER — Encounter: Payer: Self-pay | Admitting: Family Medicine

## 2016-12-20 DIAGNOSIS — F332 Major depressive disorder, recurrent severe without psychotic features: Secondary | ICD-10-CM

## 2016-12-20 DIAGNOSIS — F411 Generalized anxiety disorder: Secondary | ICD-10-CM | POA: Diagnosis present

## 2016-12-20 DIAGNOSIS — N912 Amenorrhea, unspecified: Secondary | ICD-10-CM

## 2016-12-20 MED ORDER — PAROXETINE HCL 40 MG PO TABS: 40.0000 mg | ORAL_TABLET | ORAL | 0 refills | Status: DC

## 2016-12-20 NOTE — Patient Instructions (Signed)
You are being sent home with the diagnosis of anxiety We are stopping your prozac and restarting your paxil You are being given a prescription for this medication at your leave from the clinic We have discussed the numerous risks associated with this medication in regards to if you were to get pregnant We discussed birth control and you opted to discuss these options at the next appointment I want you to follow up in 2-3 weeks for evaluation and discussion of birth control, we will also schedule your pap smear at that time Please call with any questions, please call if your feelings of anxiety are not resolving

## 2016-12-20 NOTE — Progress Notes (Signed)
HPI 46 year old patient with PMH: anxiety, tobacco use, MDD, amenorrhea who presents with a 2 month history of light-headedness, hypertension, and anxiety. She states that this all started around the time that she was switched from paxil to prozac. She took the paxil for anxiety and depression. She was taken off of that medication by her former pcp because the patient had engaged in risky sexual behavior prior to that visit and the patient expressed concern that she might be pregnant. She was originally started on 40mg  prozac but this was increased to 60 mg when this dose failed to have an effect.  Patient endorses for the past two months that she has had difficulty sleeping, increasing anxiety, and decreasing appetite. She states that prior to stopping the paxil she was not having any of these symptoms and generally felt like a "normal person." Patient has had multiple ed visits in recent months with labwork revealing no abnormality. Most recent visit on 7/22. Received small dose of ativan which helped calm her down and told to follow up in clinic today. Additionally patient has been having recent delusions of being pregnant. She requests a pregnancy test at each of these ed visits. The last time that she had sexual intercourse was over 2 months ago, and she states that it had been multiple years prior to that. She says that she does not want to have sexual intercourse anytime soon.  Patient educated extensively and asked multiple times if she would consider birth control. She is very hesitant to the idea but stated that she would consider it and approach that topic at a later date. Explained to patient that paxil is a category-x drug for pregnancy and is thus very harmful to any potential fetus. I reiterated this to the patient several times during the encounter. She voiced understanding and wants to be put back on the paxil.   CC: lightheadedness   ROS:  Review of Systems  Constitutional: Negative  for chills, fever and weight loss.  HENT: Negative for congestion, hearing loss, nosebleeds and sore throat.   Eyes: Negative for pain, discharge and redness.  Respiratory: Negative for cough.   Cardiovascular: Negative for chest pain and palpitations.  Gastrointestinal: Positive for abdominal pain. Negative for constipation, diarrhea, nausea and vomiting.  Musculoskeletal: Negative for myalgias.  Skin: Negative for itching and rash.  Neurological: Negative for dizziness, focal weakness and headaches.  Psychiatric/Behavioral: Negative for depression, hallucinations, memory loss and suicidal ideas. The patient is nervous/anxious and has insomnia.      Review of Systems   CC, SH/smoking status, and VS noted  Objective: BP (!) 158/96   Temp 98.5 F (36.9 C) (Oral)   Wt 169 lb (76.7 kg)   LMP 12/18/2016   BMI 31.93 kg/m  Gen: NAD, alert, cooperative, and pleasant. Patient very anxious while in room, constantly fidgeting around. AOx3. HEENT: NCAT, EOMI, PERRL  CV: RRR, no murmur Resp: CTAB, no wheezes, non-labored Abd: SNTND, BS present, no guarding or organomegaly Ext: No edema, warm, palpable dp/pt bilaterally, palpable radial pulse bilaterally Neuro: Alert and oriented, Speech clear, No gross deficits   Assessment and plan:  Recurrent major depression-severe Patient switched from paxil to fluoxetine around 2 months ago. Patient is new to me so I am unable to say with certainty what he mental state was prior to the switch. Per patient she felt much calmer, was sleeping much better, and had less paranoia. Was switched from the paxil as patient had been engaged in  risky sexual behavior. This has all resolved and patient wishes to be placed back on paxil. Had extensive conversation with patient detailing the risks of this medication if she was to become pregnant. She wishes to think about birth control, and discuss it more at next appointment. She will also need a pap smear at that  time. - Will restart paxil at 40mg  daily - Discontinue prozac - Patient to follow up in 2 weeks - Will discuss birth control options at that time - Will schedule pap smear at that time  ANXIETY DISORDER, GENERALIZED Switched patient to paxil as detailed in diagnosis of recurrent major dperession  Amenorrhea Patient continuing to have menstrual cycles. Patient believes she is pregnant. Had blood pregnancy test 2 days prior to clinic visit. Assured patient she is not pregnant.   No orders of the defined types were placed in this encounter.   Meds ordered this encounter  Medications  . PARoxetine (PAXIL) 40 MG tablet    Sig: Take 1 tablet (40 mg total) by mouth every morning.    Dispense:  30 tablet    Refill:  0     Myrene BuddyJacob Ulrick Methot MD PGY-1 Family Medicine Resident 12/24/2016 10:14 PM

## 2016-12-24 NOTE — Assessment & Plan Note (Signed)
Patient continuing to have menstrual cycles. Patient believes she is pregnant. Had blood pregnancy test 2 days prior to clinic visit. Assured patient she is not pregnant.

## 2016-12-24 NOTE — Assessment & Plan Note (Signed)
Switched patient to paxil as detailed in diagnosis of recurrent major dperession

## 2016-12-24 NOTE — Assessment & Plan Note (Signed)
Patient switched from paxil to fluoxetine around 2 months ago. Patient is new to me so I am unable to say with certainty what he mental state was prior to the switch. Per patient she felt much calmer, was sleeping much better, and had less paranoia. Was switched from the paxil as patient had been engaged in risky sexual behavior. This has all resolved and patient wishes to be placed back on paxil. Had extensive conversation with patient detailing the risks of this medication if she was to become pregnant. She wishes to think about birth control, and discuss it more at next appointment. She will also need a pap smear at that time. - Will restart paxil at 40mg  daily - Discontinue prozac - Patient to follow up in 2 weeks - Will discuss birth control options at that time - Will schedule pap smear at that time

## 2016-12-24 NOTE — Assessment & Plan Note (Deleted)
Patient switched from paxil to prozac around to

## 2017-01-02 ENCOUNTER — Ambulatory Visit: Payer: Medicaid Other | Admitting: Family Medicine

## 2017-01-25 ENCOUNTER — Other Ambulatory Visit: Payer: Self-pay | Admitting: *Deleted

## 2017-01-25 MED ORDER — PAROXETINE HCL 40 MG PO TABS: 40.0000 mg | ORAL_TABLET | ORAL | 0 refills | Status: DC

## 2017-02-27 ENCOUNTER — Ambulatory Visit: Payer: Self-pay | Admitting: Family Medicine

## 2017-03-03 ENCOUNTER — Ambulatory Visit: Payer: Medicaid Other | Admitting: Family Medicine

## 2017-03-06 ENCOUNTER — Ambulatory Visit (INDEPENDENT_AMBULATORY_CARE_PROVIDER_SITE_OTHER): Payer: Medicaid Other | Admitting: Family Medicine

## 2017-03-06 ENCOUNTER — Encounter: Payer: Self-pay | Admitting: Family Medicine

## 2017-03-06 VITALS — BP 122/74 | HR 90 | Temp 98.6°F | Resp 16 | Ht 61.0 in | Wt 168.0 lb

## 2017-03-06 DIAGNOSIS — Z23 Encounter for immunization: Secondary | ICD-10-CM

## 2017-03-06 DIAGNOSIS — N912 Amenorrhea, unspecified: Secondary | ICD-10-CM

## 2017-03-06 DIAGNOSIS — R140 Abdominal distension (gaseous): Secondary | ICD-10-CM

## 2017-03-06 LAB — POCT URINALYSIS DIP (DEVICE)
BILIRUBIN URINE: NEGATIVE
Glucose, UA: NEGATIVE mg/dL
KETONES UR: 15 mg/dL — AB
Leukocytes, UA: NEGATIVE
NITRITE: NEGATIVE
Specific Gravity, Urine: >=1.03 (ref 1.005–1.030)
Urobilinogen, UA: 0.2 mg/dL (ref 0.0–1.0)
pH: 5.5 (ref 5.0–8.0)

## 2017-03-06 NOTE — Patient Instructions (Addendum)
Resume Paxil for anxiety and depression.

## 2017-03-06 NOTE — Progress Notes (Signed)
Patient ID: Theresa Olson, female    DOB: [birth year], 46 y.o.   MRN: [id number]  PCP: Bing Neighbors, FNP  Chief Complaint  Patient presents with  . Establish Care    Subjective:  HPI Theresa Olson is a 46 y.o. female presents for evaluation to establish care. Medical history significant for depression, anxiety, and amenorrhea. Patient reports that her  last menstrual period was 01/14/2017 and she is concerned for pregnancy. Periods are irregular. Last sexual activity over month ago. She has taken multiple home pregnancy test which resulted as negative, yet she insists that she is pregnant. Reports symptoms of nausea, urinary frequency, headaches, light headedness, and dizziness. She was previously treated at [facility] practice center and has previously been prescribed Paxil for depression and anxiety which she reports that she stopped taking a few weeks prior as she thinks she is pregnant.  Social History   Social History  . Marital status: Single    Spouse name: N/A  . Number of children: N/A  . Years of education: N/A   Occupational History  . Not on file.   Social History Main Topics  . Smoking status: Former Smoker    Types: Cigarettes    Quit date: 08/28/2016  . Smokeless tobacco: Never Used  . Alcohol use Yes     Comment: 2-3 glasses of vodka per day  . Drug use: Yes    Types: Marijuana     Comment: smokes every few days  . Sexual activity: Yes    Birth control/ protection: None   Other Topics Concern  . Not on file   Social History Narrative  . No narrative on file    Family History  Problem Relation Age of Onset  . Hyperlipidemia Mother   . Diabetes Mother   . Hyperlipidemia Father   . Diabetes Father   . Heart disease Father    Review of Systems See HPI Patient Active Problem List   Diagnosis Date Noted  . Amenorrhea 11/17/2016  . Frequent No-show for appointment 08/11/2016  . Recurrent major depression-severe (HCC) 11/02/2011  .  TOBACCO USER 03/07/2009  . ANXIETY DISORDER, GENERALIZED 08/20/2008    No Known Allergies  Prior to Admission medications   Medication Sig Start Date End Date Taking? Authorizing Provider  Methylsulfonylmethane (MSM) 1000 MG CAPS Take 1,000 mg by mouth daily.   Yes [provider]  OVER THE COUNTER MEDICATION Take 1 tablet by mouth daily. Anti-aging OTC   Yes [provider]  PARoxetine (PAXIL) 40 MG tablet Take 1 tablet (40 mg total) by mouth every morning. 01/25/17  Yes Myrene Buddy, MD  prenatal vitamin w/FE, FA (PRENATAL 1 + 1) 27-1 MG TABS tablet Take 1 tablet by mouth daily at 12 noon. 10/31/16  Yes Joanna Puff, MD  vitamin C (ASCORBIC ACID) 500 MG tablet Take 500 mg by mouth every morning.   Yes [provider]   Past Medical, Surgical Family and Social History reviewed and updated.   Objective:   Today's Vitals   03/06/17 1327  BP: 122/74  Pulse: 90  Resp: 16  Temp: 98.6 F (37 C)  TempSrc: Oral  SpO2: 99%  Weight: 168 lb (76.2 kg)  Height:  (1.549 m)    Wt Readings from Last 3 Encounters:  03/06/17 168 lb (76.2 kg)  12/20/16 169 lb (76.7 kg)  12/18/16 167 lb (75.8 kg)    Physical Exam  Constitutional: She is oriented to person, place, and time.  She appears well-developed and well-nourished.  HENT:  Head: Normocephalic and atraumatic.  Cardiovascular: Normal rate, normal heart sounds and intact distal pulses.   Pulmonary/Chest: Effort normal and breath sounds normal.  Abdominal: Soft. Bowel sounds are normal. She exhibits no distension and no mass. There is no tenderness. There is no rebound and no guarding.  Musculoskeletal: Normal range of motion.  Neurological: She is alert and oriented to person, place, and time.  Skin: Skin is warm.  Psychiatric: Her behavior is normal. Judgment and thought content normal. Her mood appears anxious. Her speech is rapid and/or pressured.   Assessment & Plan:  1. Amenorrhea, urine  pregnancy negative. HCG pending  2. Abdominal bloating 3. Need for influenza vaccination- Flu Vaccine QUAD 6+ mos PF IM (Fluarix Quad PF) 4. Depression and Anxiety- resume Paxil    Orders Placed This Encounter  Procedures  . Flu Vaccine QUAD 6+ mos PF IM (Fluarix Quad PF)  . hCG, Total, Quantitative  . FSH/LH  . Prolactin  . COMPLETE METABOLIC PANEL WITH GFR  . CBC with Differential  . POCT urinalysis dip (device)  . POCT urine pregnancy   RTC: 2 months for physical exam  Godfrey Pick. Tiburcio Pea, MSN, FNP-C [facility]  [address] [place], [place] [address] [phone]

## 2017-03-07 LAB — CBC WITH DIFFERENTIAL/PLATELET
BASOS ABS: 145 {cells}/uL (ref 0–200)
Basophils Relative: 1.2 %
EOS PCT: 1.5 %
Eosinophils Absolute: 182 cells/uL (ref 15–500)
HEMATOCRIT: 37.1 % (ref 35.0–45.0)
Hemoglobin: 12.6 g/dL (ref 11.7–15.5)
LYMPHS ABS: 1984 {cells}/uL (ref 850–3900)
MCH: 30.9 pg (ref 27.0–33.0)
MCHC: 34 g/dL (ref 32.0–36.0)
MCV: 90.9 fL (ref 80.0–100.0)
MONOS PCT: 9.6 %
MPV: 11.6 fL (ref 7.5–12.5)
Neutro Abs: 8627 cells/uL — ABNORMAL HIGH (ref 1500–7800)
Neutrophils Relative %: 71.3 %
PLATELETS: 273 10*3/uL (ref 140–400)
RBC: 4.08 10*6/uL (ref 3.80–5.10)
RDW: 13.1 % (ref 11.0–15.0)
TOTAL LYMPHOCYTE: 16.4 %
WBC: 12.1 10*3/uL — AB (ref 3.8–10.8)
WBCMIX: 1162 {cells}/uL — AB (ref 200–950)

## 2017-03-07 LAB — COMPLETE METABOLIC PANEL WITH GFR
AG Ratio: 1.2 (calc) (ref 1.0–2.5)
ALKALINE PHOSPHATASE (APISO): 67 U/L (ref 33–115)
ALT: 13 U/L (ref 6–29)
AST: 16 U/L (ref 10–35)
Albumin: 3.8 g/dL (ref 3.6–5.1)
BILIRUBIN TOTAL: 0.3 mg/dL (ref 0.2–1.2)
BUN: 11 mg/dL (ref 7–25)
CHLORIDE: 102 mmol/L (ref 98–110)
CO2: 27 mmol/L (ref 20–32)
CREATININE: 0.77 mg/dL (ref 0.50–1.10)
Calcium: 9.5 mg/dL (ref 8.6–10.2)
GFR, Est African American: 107 mL/min/{1.73_m2} (ref >=60)
GFR, Est Non African American: 92 mL/min/{1.73_m2} (ref >=60)
GLUCOSE: 104 mg/dL — AB (ref 65–99)
Globulin: 3.2 g/dL (calc) (ref 1.9–3.7)
Potassium: 4.3 mmol/L (ref 3.5–5.3)
Sodium: 138 mmol/L (ref 135–146)
Total Protein: 7 g/dL (ref 6.1–8.1)

## 2017-03-07 LAB — FSH/LH
FSH: 9.6 m[IU]/mL
LH: 14.1 m[IU]/mL

## 2017-03-07 LAB — HCG, TOTAL, QUANTITATIVE

## 2017-03-07 LAB — PROLACTIN: PROLACTIN: 17.1 ng/mL

## 2017-03-11 LAB — POCT URINE PREGNANCY: Preg Test, Ur: NEGATIVE

## 2017-03-27 ENCOUNTER — Ambulatory Visit: Payer: Medicaid Other | Admitting: Family Medicine

## 2017-03-29 ENCOUNTER — Ambulatory Visit (INDEPENDENT_AMBULATORY_CARE_PROVIDER_SITE_OTHER): Payer: Medicaid Other | Admitting: Family Medicine

## 2017-03-29 ENCOUNTER — Ambulatory Visit (HOSPITAL_COMMUNITY)
Admission: RE | Admit: 2017-03-29 | Discharge: 2017-03-29 | Disposition: A | Discharge status: Home / Self Care | Payer: Medicaid Other | Source: Ambulatory Visit | Attending: Family Medicine | Admitting: Family Medicine

## 2017-03-29 ENCOUNTER — Encounter: Payer: Self-pay | Admitting: Family Medicine

## 2017-03-29 ENCOUNTER — Telehealth: Payer: Self-pay | Admitting: Family Medicine

## 2017-03-29 ENCOUNTER — Ambulatory Visit: Payer: Medicaid Other | Admitting: Family Medicine

## 2017-03-29 VITALS — BP 130/82 | HR 96 | Temp 98.5°F | Resp 16 | Ht 61.0 in | Wt 169.0 lb

## 2017-03-29 DIAGNOSIS — R140 Abdominal distension (gaseous): Secondary | ICD-10-CM | POA: Insufficient documentation

## 2017-03-29 DIAGNOSIS — K5900 Constipation, unspecified: Secondary | ICD-10-CM

## 2017-03-29 LAB — POCT URINALYSIS DIP (DEVICE)
BILIRUBIN URINE: NEGATIVE
Glucose, UA: NEGATIVE mg/dL
KETONES UR: NEGATIVE mg/dL
Leukocytes, UA: NEGATIVE
Nitrite: NEGATIVE
PH: 5.5 (ref 5.0–8.0)
Protein, ur: 100 mg/dL — AB
Specific Gravity, Urine: >=1.03 (ref 1.005–1.030)
Urobilinogen, UA: 0.2 mg/dL (ref 0.0–1.0)

## 2017-03-29 MED ORDER — SIMETHICONE 80 MG PO CHEW: 80.0000 mg | CHEWABLE_TABLET | Freq: Four times a day (QID) | ORAL | 0 refills | Status: DC | PRN

## 2017-03-29 MED ORDER — POLYETHYLENE GLYCOL 3350 17 GM/SCOOP PO POWD: 17.0000 g | Freq: Two times a day (BID) | ORAL | 1 refills | Status: DC | PRN

## 2017-03-29 MED ORDER — RANITIDINE HCL 150 MG PO TABS: 150.0000 mg | ORAL_TABLET | Freq: Two times a day (BID) | ORAL | 0 refills | Status: DC

## 2017-03-29 NOTE — Progress Notes (Signed)
Patient ID: Theresa Olson, female    DOB: Jun 07, 1970, 46 y.o.   MRN: [id number]  PCP: Bing Neighbors, FNP  Chief Complaint  Patient presents with  . Abdominal Pain    LOWER PART    Subjective:  HPI Theresa Olson is a 46 y.o. female presents for evaluation of lower abdominal pain. Medical problems include anxiety, major depression disorder, irregular menstrual period. She reports lower abdominal pain which only occurs when she lays on her stomach. This problem has been present for last few weeks. Pain is localized to bilateral lower abdomen region. Reports associated bloating and excessive flatulence production. She reports regular daily bowel movements that she reports as non-loose or hardened. Denies nausea, vomiting, or sharp or throbbing abdominal pain. Patient's last menstrual period was 03/26/2017.  Social History   Social History  . Marital status: Single    Spouse name: N/A  . Number of children: N/A  . Years of education: N/A   Occupational History  . Not on file.   Social History Main Topics  . Smoking status: Former Smoker    Types: Cigarettes    Quit date: 08/28/2016  . Smokeless tobacco: Never Used  . Alcohol use Yes     Comment: 2-3 glasses of vodka per day  . Drug use: Yes    Types: Marijuana     Comment: smokes every few days  . Sexual activity: Yes    Birth control/ protection: None   Other Topics Concern  . Not on file   Social History Narrative  . No narrative on file    Family History  Problem Relation Age of Onset  . Hyperlipidemia Mother   . Diabetes Mother   . Hyperlipidemia Father   . Diabetes Father   . Heart disease Father    Review of Systems See HPI  Patient Active Problem List   Diagnosis Date Noted  . Amenorrhea 11/17/2016  . Frequent No-show for appointment 08/11/2016  . Recurrent major depression-severe (HCC) 11/02/2011  . TOBACCO USER 03/07/2009  . ANXIETY DISORDER, GENERALIZED 08/20/2008    No Known  Allergies  Prior to Admission medications   Medication Sig Start Date End Date Taking? Authorizing Provider  Methylsulfonylmethane (MSM) 1000 MG CAPS Take 1,000 mg by mouth daily.   Yes [provider]  OVER THE COUNTER MEDICATION Take 1 tablet by mouth daily. Anti-aging OTC   Yes [provider]  PARoxetine (PAXIL) 40 MG tablet Take 1 tablet (40 mg total) by mouth every morning. 01/25/17  Yes Myrene Buddy, MD  prenatal vitamin w/FE, FA (PRENATAL 1 + 1) 27-1 MG TABS tablet Take 1 tablet by mouth daily at 12 noon. 10/31/16  Yes Joanna Puff, MD  vitamin C (ASCORBIC ACID) 500 MG tablet Take 500 mg by mouth every morning.   Yes [provider]    Past Medical, Surgical Family and Social History reviewed and updated.    Objective:   Today's Vitals   03/29/17 1415  BP: 130/82  Pulse: 96  Resp: 16  Temp: 98.5 F (36.9 C)  TempSrc: Oral  SpO2: 97%  Weight: 169 lb (76.7 kg)  Height: 5\' 1"  (1.549 m)    Wt Readings from Last 3 Encounters:  03/29/17 169 lb (76.7 kg)  03/06/17 168 lb (76.2 kg)  12/20/16 169 lb (76.7 kg)    Physical Exam  Constitutional: She is oriented to person, place, and time. She appears well-developed and well-nourished.  HENT:  Head: Normocephalic and atraumatic.  Neck:  Normal range of motion. Neck supple.  Cardiovascular: Normal rate, regular rhythm and normal heart sounds.  Pulmonary/Chest: Effort normal and breath sounds normal.  Abdominal: Soft. Normal appearance and bowel sounds are normal. There is no tenderness. There is no rebound and no CVA tenderness.  Musculoskeletal: Normal range of motion.  Neurological: She is alert and oriented to person, place, and time.  Skin: Skin is warm and dry.  Psychiatric: She has a normal mood and affect. Her behavior is normal. Judgment and thought content normal.   Assessment & Plan:  1. Abdominal bloating, likely related to indigestion and constipation. Will trial ranitidine 150 mg  twice daily and simethicone 80 mg chewable tablets once every 6 hours as needed for increased flatulence.  2. Constipation, unspecified constipation type -We will trial polyethylene glycol powder 17 g twice daily as needed to relieve stool burden.   Orders Placed This Encounter  Procedures  . DG Abd 1 View  . POCT urinalysis dip (device)   RTC: 05/08/2017 Routine follow-up of depression, anxiety, and address outstanding health maintenance.  Godfrey PickKimberly S. Tiburcio PeaHarris, MSN, FNP-C [facility]  [address] [place], [place] [address] [phone]

## 2017-03-29 NOTE — Patient Instructions (Signed)
Abdominal Bloating °When you have abdominal bloating, your abdomen may feel full, tight, or painful. It may also look bigger than normal or swollen (distended). Common causes of abdominal bloating include: °· Swallowing air. °· Constipation. °· Problems digesting food. °· Eating too much. °· Irritable bowel syndrome. This is a condition that affects the large intestine. °· Lactose intolerance. This is an inability to digest lactose, a natural sugar in dairy products. °· Celiac disease. This is a condition that affects the ability to digest gluten, a protein found in some grains. °· Gastroparesis. This is a condition that slows down the movement of food in the stomach and small intestine. It is more common in people with diabetes mellitus. °· Gastroesophageal reflux disease (GERD). This is a digestive condition that makes stomach acid flow back into the esophagus. °· Urinary retention. This means that the body is holding onto urine, and the bladder cannot be emptied all the way. ° °Follow these instructions at home: °Eating and drinking °· Avoid eating too much. °· Try not to swallow air while talking or eating. °· Avoid eating while lying down. °· Avoid these foods and drinks: °? Foods that cause gas, such as broccoli, cabbage, cauliflower, and baked beans. °? Carbonated drinks. °? Hard candy. °? Chewing gum. °Medicines °· Take over-the-counter and prescription medicines only as told by your health care provider. °· Take probiotic medicines. These medicines contain live bacteria or yeasts that can help digestion. °· Take coated peppermint oil capsules. °Activity °· Try to exercise regularly. Exercise may help to relieve bloating that is caused by gas and relieve constipation. °General instructions °· Keep all follow-up visits as told by your health care provider. This is important. °Contact a health care provider if: °· You have nausea and vomiting. °· You have diarrhea. °· You have abdominal pain. °· You have  unusual weight loss or weight gain. °· You have severe pain, and medicines do not help. °Get help right away if: °· You have severe chest pain. °· You have trouble breathing. °· You have shortness of breath. °· You have trouble urinating. °· You have darker urine than normal. °· You have blood in your stools or have dark, tarry stools. °Summary °· Abdominal bloating means that the abdomen is swollen. °· Common causes of abdominal bloating are swallowing air, constipation, and problems digesting food. °· Avoid eating too much and avoid swallowing air. °· Avoid foods that cause gas, carbonated drinks, hard candy, and chewing gum. °This information is not intended to replace advice given to you by your health care provider. Make sure you discuss any questions you have with your health care provider. °Document Released: 06/17/2016 Document Revised: 06/17/2016 Document Reviewed: 06/17/2016 °Elsevier Interactive Patient Education © 2018 Elsevier Inc. ° °

## 2017-03-29 NOTE — Telephone Encounter (Signed)
Contact patient to advise her abdominal image was significant for mild constipation and gas. She should continue the regimen previously prescribed and I've sent Miralax to the pharmacy for her to take twice daily as needed until constipation resolves.   Theresa PickKimberly S. Tiburcio PeaHarris, MSN, FNP-C [facility]  [address] [place], [place] [id number] [phone]

## 2017-03-30 NOTE — Telephone Encounter (Signed)
Left a vm for patient to callback 

## 2017-03-31 NOTE — Telephone Encounter (Signed)
Patient notified

## 2017-03-31 NOTE — Telephone Encounter (Signed)
Left another vm for patient to callback  

## 2017-05-03 ENCOUNTER — Other Ambulatory Visit: Payer: Self-pay

## 2017-05-03 ENCOUNTER — Other Ambulatory Visit: Payer: Self-pay | Admitting: Family Medicine

## 2017-05-04 ENCOUNTER — Telehealth: Payer: Self-pay | Admitting: Family Medicine

## 2017-05-04 MED ORDER — PAROXETINE HCL 40 MG PO TABS: 40.0000 mg | ORAL_TABLET | ORAL | 0 refills | Status: DC

## 2017-05-04 NOTE — Telephone Encounter (Signed)
Left a vm for patient to callback regarding medication  

## 2017-05-04 NOTE — Telephone Encounter (Signed)
Patient wants to know why her Paxil is not be filled. Patient states that she spoke with you about her Paxil at her office visit.

## 2017-05-04 NOTE — Telephone Encounter (Signed)
In review her initial visit 03/06/2017,Paxil was not prescribed by me, she was already prescribed and she voluntarily stopped medication, and was advised by me to resume. This medication was last filled by her PCP 01/25/2017, not psychiatric, therefore I will resume the Paxil and provide a limited  supply as she has an appointment scheduled with me on 05/08/2017, at that point we can discuss medication.    Ask patient if she is actively experiencing depression, suicidal thoughts or thoughts of harming others., if she is positive for any of these symptoms should be evaluated at [facility] immediately by going to [facility]age [facility].

## 2017-05-05 ENCOUNTER — Ambulatory Visit (INDEPENDENT_AMBULATORY_CARE_PROVIDER_SITE_OTHER): Payer: Medicaid Other | Admitting: Family Medicine

## 2017-05-05 VITALS — BP 100/78 | HR 110 | Temp 98.3°F | Resp 16 | Ht 61.0 in | Wt 169.2 lb

## 2017-05-05 DIAGNOSIS — G5601 Carpal tunnel syndrome, right upper limb: Secondary | ICD-10-CM

## 2017-05-05 DIAGNOSIS — F3162 Bipolar disorder, current episode mixed, moderate: Secondary | ICD-10-CM | POA: Diagnosis not present

## 2017-05-05 MED ORDER — PAROXETINE HCL 40 MG PO TABS: 40.0000 mg | ORAL_TABLET | ORAL | 1 refills | Status: DC

## 2017-05-05 MED ORDER — NAPROXEN 500 MG PO TABS: 500.0000 mg | ORAL_TABLET | Freq: Two times a day (BID) | ORAL | 0 refills | Status: AC

## 2017-05-05 MED ORDER — GABAPENTIN 300 MG PO CAPS: 300.0000 mg | ORAL_CAPSULE | Freq: Three times a day (TID) | ORAL | 3 refills | Status: DC

## 2017-05-05 NOTE — Patient Instructions (Addendum)
I am referring you to psychiatry for further evaluation and management of symptoms of bipolar disorder.  I am starting you on Gabapentin as a mood stabilizer 300 mg , 3 times daily. Medication initially may cause drowsiness.  Continue Paxil 40 mg once daily in morning.        Bipolar 1 Disorder Bipolar 1 disorder is a mental health disorder in which a person has episodes of emotional highs (mania), and may also have episodes of emotional lows (depression) in addition to highs. Bipolar 1 disorder is different from other bipolar disorders because it involves extreme manic episodes. These episodes last at least one week or involve symptoms that are so severe that hospitalization is needed to keep the person safe. What increases the risk? The cause of this condition is not known. However, certain factors make you more likely to have bipolar disorder, such as:  Having a family member with the disorder.  An imbalance of certain chemicals in the brain (neurotransmitters).  Stress, such as illness, financial problems, or a death.  Certain conditions that affect the brain or spinal cord (neurologic conditions).  Brain injury (trauma).  Having another mental health disorder, such as: ? Obsessive compulsive disorder. ? Schizophrenia.  What are the signs or symptoms? Symptoms of mania include:  Very high self-esteem or self-confidence.  Decreased need for sleep.  Unusual talkativeness or feeling a need to keep talking. Speech may be very fast. It may seem like you cannot stop talking.  Racing thoughts or constant talking, with quick shifts between topics that may or may not be related (flight of ideas).  Decreased ability to focus or concentrate.  Increased purposeful activity, such as work, studies, or social activity.  Increased nonproductive activity. This could be pacing, squirming and fidgeting, or finger and toe tapping.  Impulsive behavior and poor judgment. This may result  in high-risk activities, such as having unprotected sex or spending a lot of money.  Symptoms of depression include:  Feeling sad, hopeless, or helpless.  Frequent or uncontrollable crying.  Lack of feeling or caring about anything.  Sleeping too much.  Moving more slowly than usual.  Not being able to enjoy things you used to enjoy.  Wanting to be alone all the time.  Feeling guilty or worthless.  Lack of energy or motivation.  Trouble concentrating or remembering.  Trouble making decisions.  Increased appetite.  Thoughts of death, or the desire to harm yourself.  Sometimes, you may have a mixed mood. This means having symptoms of depression and mania. Stress can make symptoms worse. How is this diagnosed? To diagnose bipolar disorder, your health care provider may ask about your:  Emotional episodes.  Medical history.  Alcohol and drug use. This includes prescription medicines. Certain medical conditions and substances can cause symptoms that seem like bipolar disorder (secondary bipolar disorder).  How is this treated? Bipolar disorder is a long-term (chronic) illness. It is best controlled with ongoing (continuous) treatment rather than treatment only when symptoms occur. Treatment may include:  Medicine. Medicine can be prescribed by a provider who specializes in treating mental disorders (psychiatrist). ? Medicines called mood stabilizers are usually prescribed. ? If symptoms occur even while taking a mood stabilizer, other medicines may be added.  Psychotherapy. Some forms of talk therapy, such as cognitive-behavioral therapy (CBT), can provide support, education, and guidance.  Coping methods, such as journaling or relaxation exercises. These may include: ? Yoga. ? Meditation. ? Deep breathing.  Lifestyle changes, such as: ? Limiting  alcohol and drug use. ? Exercising regularly. ? Getting plenty of sleep. ? Making healthy eating choices.  A  combination of medicine, talk therapy, and coping methods is best. A procedure in which electricity is applied to the brain through the scalp (electroconvulsive therapy) may be used in cases of severe mania when medicine and psychotherapy work too slowly or do not work. Follow these instructions at home: Activity   Return to your normal activities as told by your health care provider.  Find activities that you enjoy, and make time to do them.  Exercise regularly as told by your health care provider. Lifestyle  Limit alcohol intake to no more than 1 drink a day for nonpregnant women and 2 drinks a day for men. One drink equals 12 oz of beer, 5 oz of wine, or 1 oz of hard liquor.  Follow a set schedule for eating and sleeping.  Eat a balanced diet that includes fresh fruits and vegetables, whole grains, low-fat dairy, and lean meat.  Get 7-8 hours of sleep each night. General instructions  Take over-the-counter and prescription medicines only as told by your health care provider.  Think about joining a support group. Your health care provider may be able to recommend a support group.  Talk with your family and loved ones about your treatment goals and how they can help.  Keep all follow-up visits as told by your health care provider. This is important. Where to find more information: For more information about bipolar disorder, visit the following websites:  [employer] Mental [employer]ness: www.nami.org  [employer] of Mental Health: http://www.maynard.net/www.nimh.nih.gov  Contact a health care provider if:  Your symptoms get worse.  You have side effects from your medicine, and they get worse.  You have trouble sleeping.  You have trouble doing daily activities.  You feel unsafe in your surroundings.  You are dealing with substance abuse. Get help right away if:  You have new symptoms.  You have thoughts about harming yourself.  You self-harm. This information is not  intended to replace advice given to you by your health care provider. Make sure you discuss any questions you have with your health care provider. Document Released: 08/22/2000 Document Revised: 01/10/2016 Document Reviewed: 01/14/2016 Elsevier Interactive Patient Education  [employer]2018 Elsevier Inc.
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
## 2017-05-05 NOTE — Telephone Encounter (Signed)
Patient notified and in office today for appointment

## 2017-05-08 ENCOUNTER — Ambulatory Visit: Payer: Medicaid Other | Admitting: Family Medicine

## 2017-05-12 ENCOUNTER — Encounter: Payer: Self-pay | Admitting: Family Medicine

## 2017-06-02 ENCOUNTER — Ambulatory Visit: Payer: Medicaid Other | Admitting: Family Medicine

## 2017-09-06 ENCOUNTER — Other Ambulatory Visit: Payer: Self-pay | Admitting: Family Medicine

## 2017-09-06 ENCOUNTER — Telehealth: Payer: Self-pay
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
## 2017-11-29 IMAGING — CR DG CHEST 2V
2 series · 2 of 2 positions shown · non-contrast
Comparison: None.

CLINICAL DATA: Central chest pain. Pain for 1 month. Shortness of
breath.

EXAM:
CHEST  2 VIEW

[w chest pa]
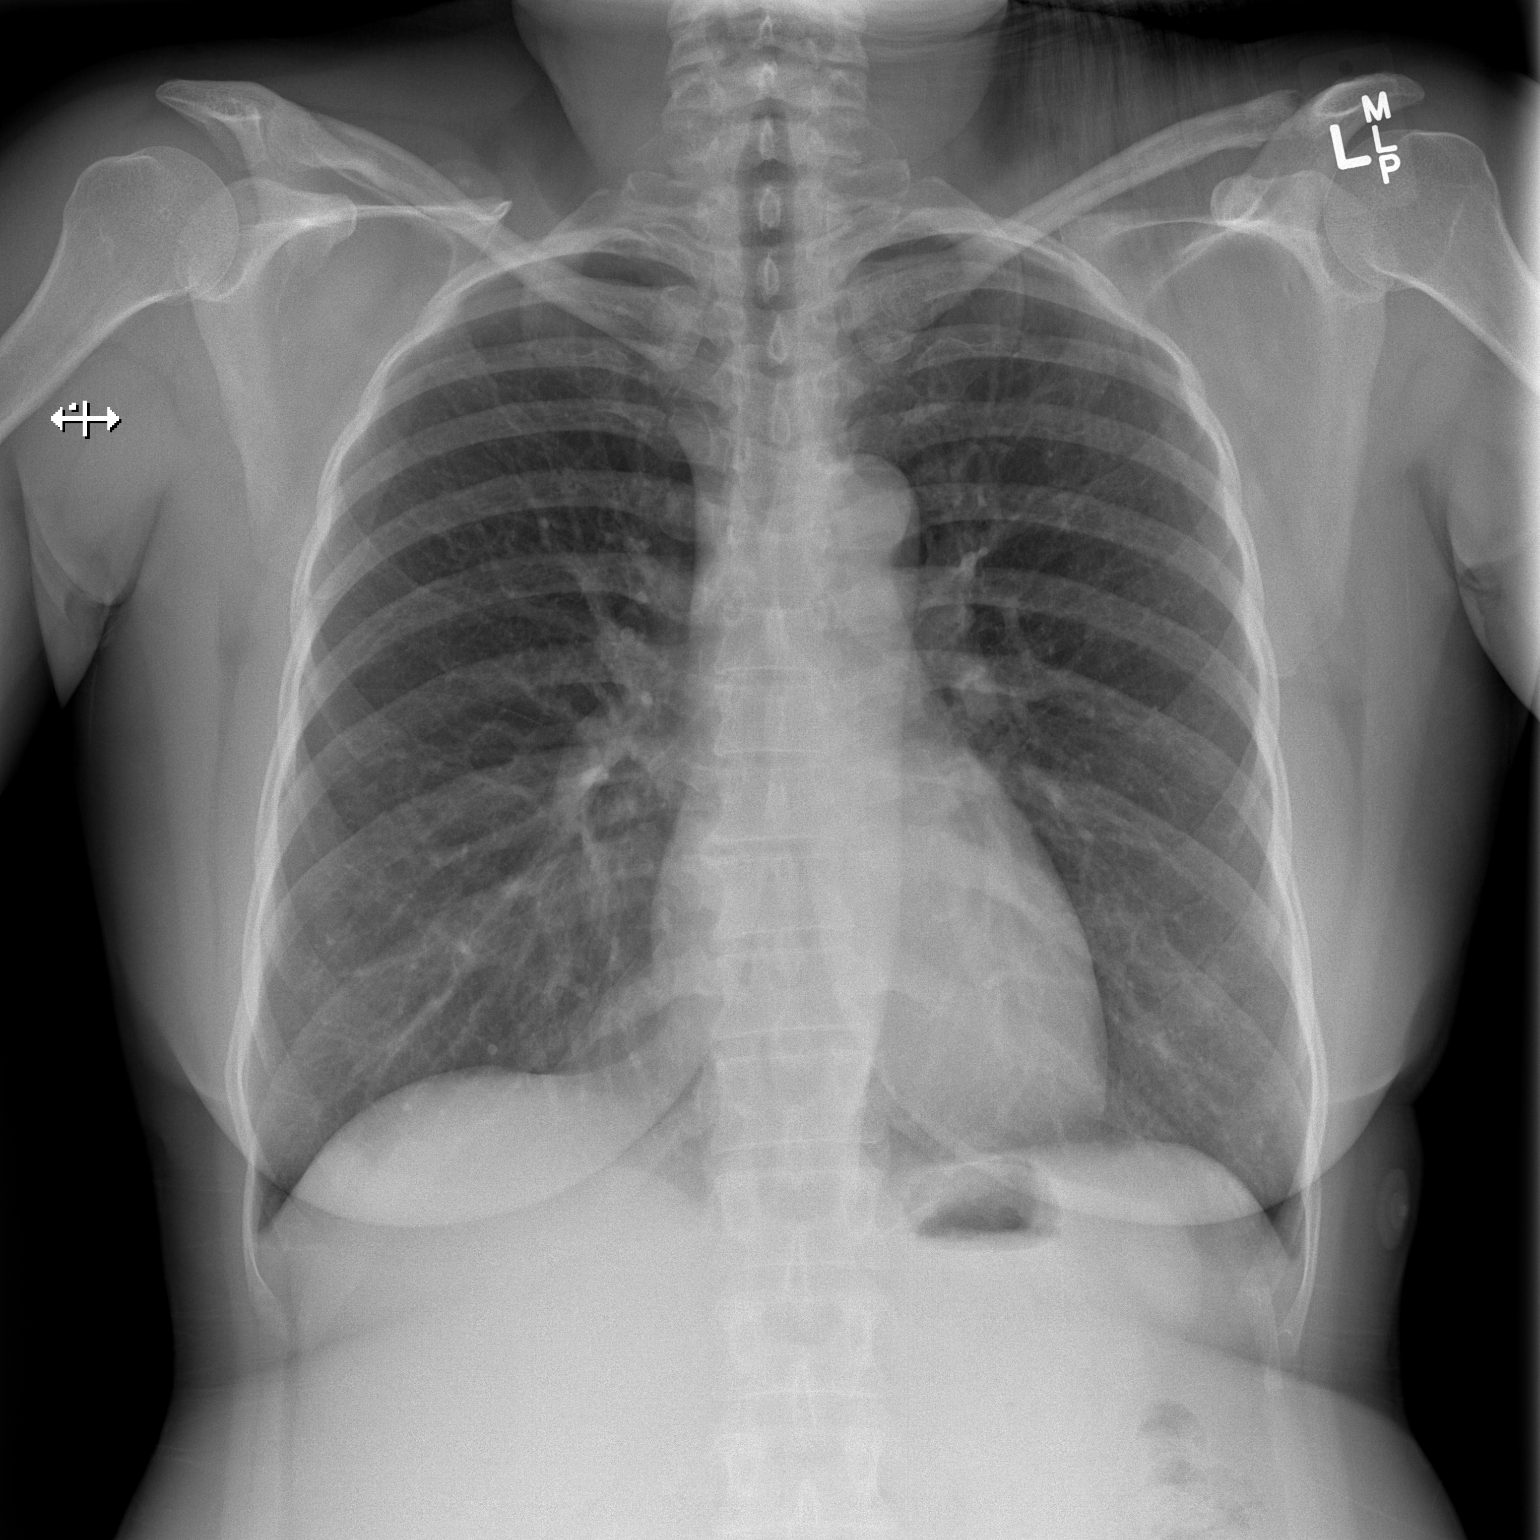

[w chest lat]
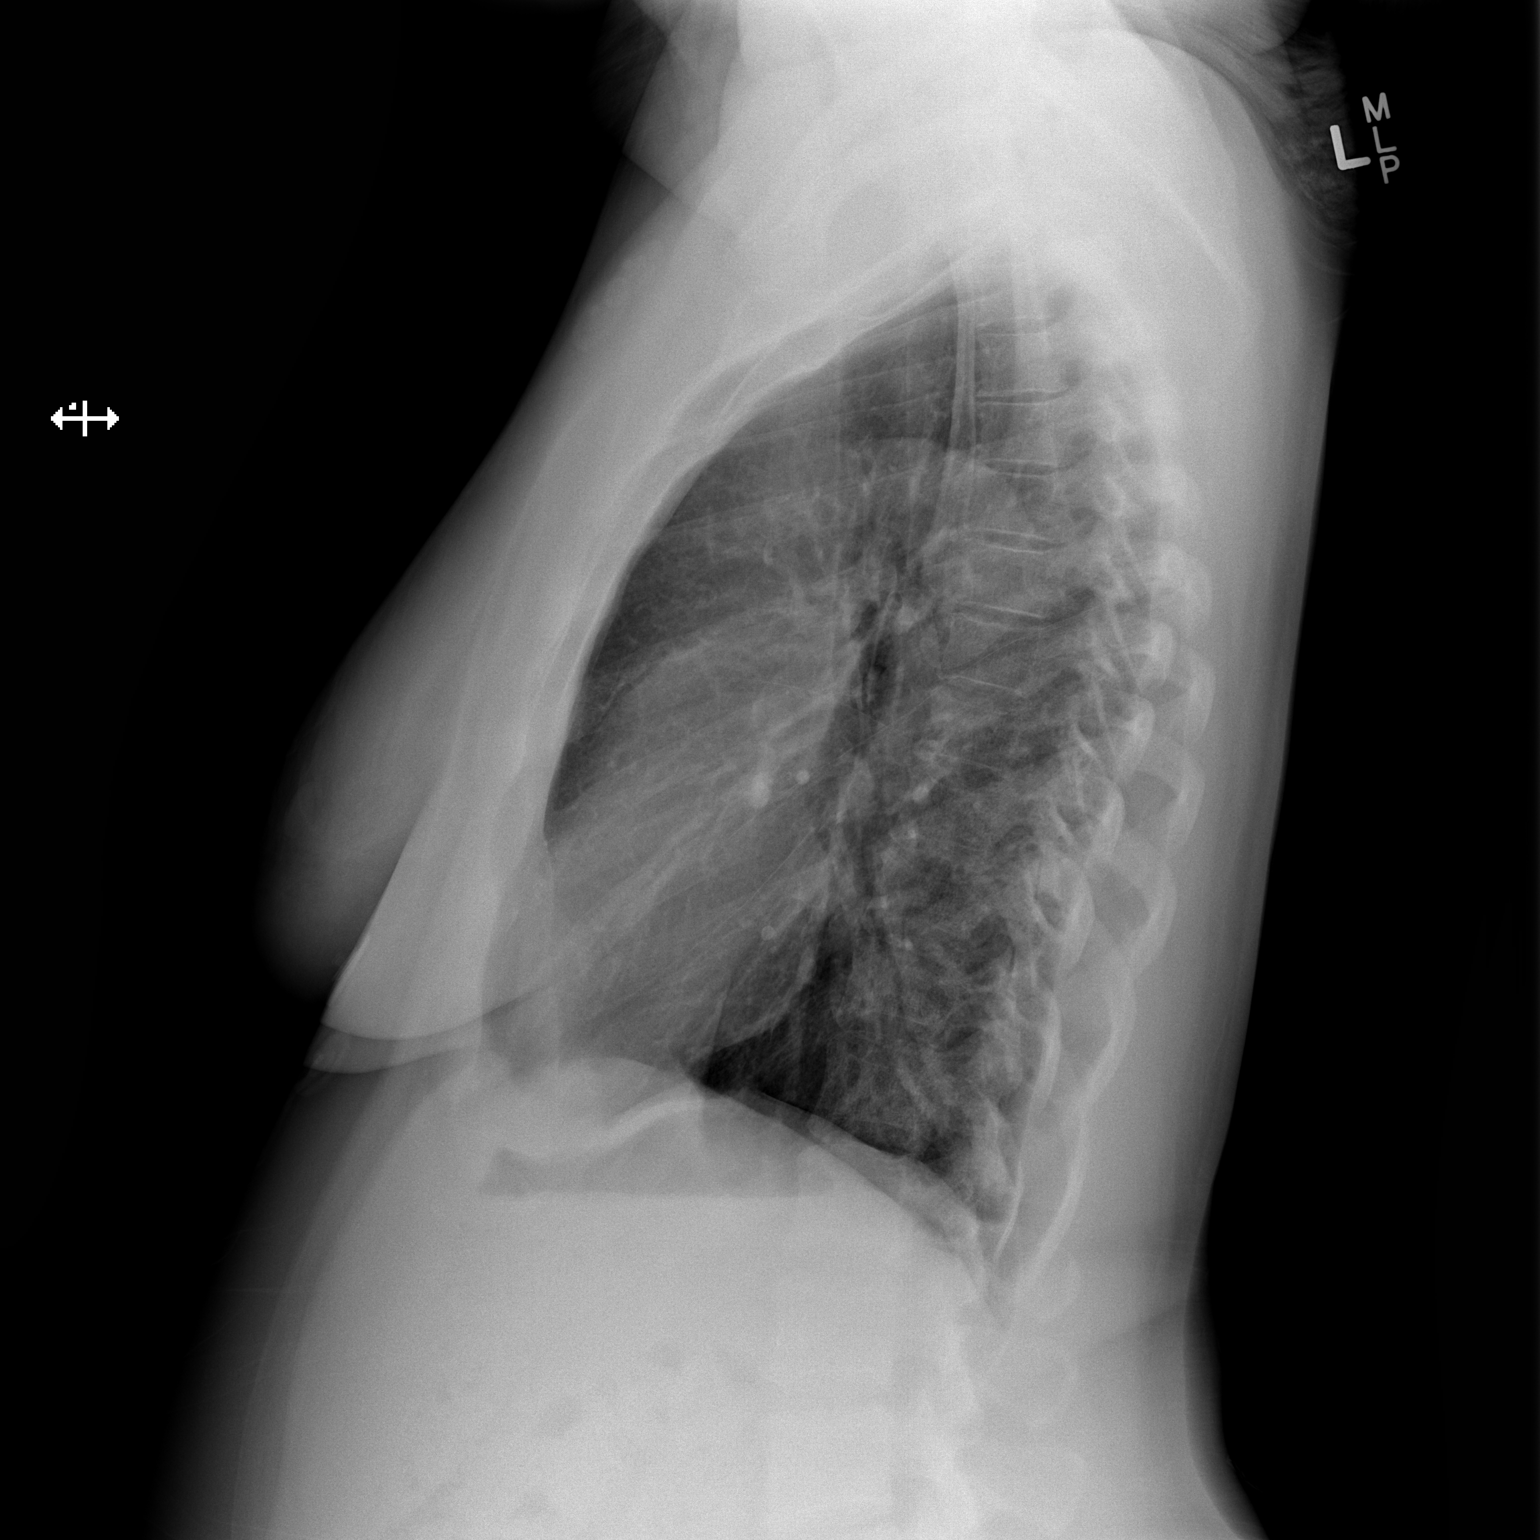

[2 of 2 positions shown; findings below may reference images not displayed]

FINDINGS: The cardiomediastinal contours are normal. The lungs are clear.
Pulmonary vasculature is normal. No consolidation, pleural effusion,
or pneumothorax. No acute osseous abnormalities are seen.
IMPRESSION: No active cardiopulmonary disease.

## 2017-12-22 DIAGNOSIS — N760 Acute vaginitis: Secondary | ICD-10-CM | POA: Diagnosis not present

## 2017-12-22 DIAGNOSIS — Z599 Problem related to housing and economic circumstances, unspecified: Secondary | ICD-10-CM | POA: Diagnosis not present

## 2017-12-22 DIAGNOSIS — Z87891 Personal history of nicotine dependence: Secondary | ICD-10-CM | POA: Insufficient documentation

## 2017-12-22 DIAGNOSIS — I10 Essential (primary) hypertension: Secondary | ICD-10-CM | POA: Insufficient documentation

## 2017-12-22 DIAGNOSIS — S20462A Insect bite (nonvenomous) of left back wall of thorax, initial encounter: Secondary | ICD-10-CM | POA: Diagnosis not present

## 2017-12-22 DIAGNOSIS — Z79899 Other long term (current) drug therapy: Secondary | ICD-10-CM | POA: Diagnosis not present

## 2017-12-22 DIAGNOSIS — A599 Trichomoniasis, unspecified: Secondary | ICD-10-CM | POA: Diagnosis not present

## 2017-12-22 DIAGNOSIS — N898 Other specified noninflammatory disorders of vagina: Secondary | ICD-10-CM | POA: Diagnosis present

## 2017-12-22 DIAGNOSIS — B9689 Other specified bacterial agents as the cause of diseases classified elsewhere: Secondary | ICD-10-CM | POA: Diagnosis not present

## 2017-12-23 ENCOUNTER — Encounter (HOSPITAL_COMMUNITY): Payer: Self-pay | Admitting: Emergency Medicine

## 2017-12-23 ENCOUNTER — Other Ambulatory Visit: Payer: Self-pay

## 2017-12-23 ENCOUNTER — Emergency Department (HOSPITAL_COMMUNITY)
Admission: EM | Admit: 2017-12-23 | Discharge: 2017-12-23 | Disposition: A | Discharge status: Home / Self Care | Payer: Medicaid Other | Attending: Emergency Medicine | Admitting: Emergency Medicine

## 2017-12-23 DIAGNOSIS — W57XXXA Bitten or stung by nonvenomous insect and other nonvenomous arthropods, initial encounter: Secondary | ICD-10-CM

## 2017-12-23 DIAGNOSIS — A599 Trichomoniasis, unspecified: Secondary | ICD-10-CM

## 2017-12-23 DIAGNOSIS — N898 Other specified noninflammatory disorders of vagina: Secondary | ICD-10-CM

## 2017-12-23 DIAGNOSIS — N760 Acute vaginitis: Secondary | ICD-10-CM

## 2017-12-23 DIAGNOSIS — B9689 Other specified bacterial agents as the cause of diseases classified elsewhere: Secondary | ICD-10-CM

## 2017-12-23 DIAGNOSIS — S20469A Insect bite (nonvenomous) of unspecified back wall of thorax, initial encounter: Secondary | ICD-10-CM

## 2017-12-23 LAB — URINALYSIS, ROUTINE W REFLEX MICROSCOPIC
Bilirubin Urine: NEGATIVE
GLUCOSE, UA: NEGATIVE mg/dL
Hgb urine dipstick: NEGATIVE
Ketones, ur: NEGATIVE mg/dL
NITRITE: NEGATIVE
PH: 5 (ref 5.0–8.0)
Protein, ur: NEGATIVE mg/dL
SPECIFIC GRAVITY, URINE: 1.016 (ref 1.005–1.030)

## 2017-12-23 LAB — WET PREP, GENITAL
SPERM: NONE SEEN
Yeast Wet Prep HPF POC: NONE SEEN

## 2017-12-23 LAB — PREGNANCY, URINE: Preg Test, Ur: NEGATIVE

## 2017-12-23 MED ORDER — METRONIDAZOLE 500 MG PO TABS: 2000.0000 mg | ORAL_TABLET | Freq: Once | ORAL | Status: DC

## 2017-12-23 MED ORDER — AZITHROMYCIN 250 MG PO TABS
1000.0000 mg | ORAL_TABLET | Freq: Once | ORAL | Status: AC
  Administered 2017-12-23: 1000 mg via ORAL
  Filled 2017-12-23: qty 4

## 2017-12-23 MED ORDER — METRONIDAZOLE 500 MG PO TABS: 500.0000 mg | ORAL_TABLET | Freq: Two times a day (BID) | ORAL | 0 refills | Status: DC

## 2017-12-23 MED ORDER — CEFTRIAXONE SODIUM 250 MG IJ SOLR
250.0000 mg | Freq: Once | INTRAMUSCULAR | Status: AC
  Administered 2017-12-23: 250 mg via INTRAMUSCULAR
  Filled 2017-12-23: qty 250

## 2017-12-23 NOTE — ED Triage Notes (Addendum)
Patient is having discharge from vagina and is burning while urinating. Patient also has patches of a rash all over upper torso.

## 2017-12-23 NOTE — ED Provider Notes (Signed)
[facility] Note   CSN: [id number] Arrival date & time: 12/22/17  2246     History   Chief Complaint Chief Complaint  Patient presents with  . Rash  . Vaginal Discharge    HPI Theresa Olson is a 47 y.o. female.  Patient presents with chief complaint of vaginal discharge and insect bites.  She states that she has been sleeping in her car and has insect bites on her back.  She states that they are very itchy.  She denies any fevers.  She also states that she had unprotected sex with a new partner about 2 weeks ago.  She has vaginal discharge now.  She denies any pain.  She has not taken anything for symptoms.  The history is provided by the patient. No language interpreter was used.    Past Medical History:  Diagnosis Date  . Anxiety   . Depression   . Hypertension     Patient Active Problem List   Diagnosis Date Noted  . Amenorrhea 11/17/2016  . Frequent No-show for appointment 08/11/2016  . Recurrent major depression-severe (HCC) 11/02/2011  . TOBACCO USER 03/07/2009  . ANXIETY DISORDER, GENERALIZED 08/20/2008    Past Surgical History:  Procedure Laterality Date  . CESAREAN SECTION       OB History    Gravida  8   Para  5   Term  5   Preterm      AB  3   Living  5     SAB  3   TAB      Ectopic      Multiple      Live Births  5            Home Medications    Prior to Admission medications   Medication Sig Start Date End Date Taking? Authorizing Provider  gabapentin (NEURONTIN) 300 MG capsule Take 1 capsule (300 mg total) by mouth 3 (three) times daily. 05/05/17  Yes Bing Neighbors, FNP  GLUTATHIONE PO Take 1 tablet by mouth daily.   Yes [provider]  Methylsulfonylmethane (MSM) 1000 MG CAPS Take 1,000 mg by mouth daily.   Yes [provider]  PARoxetine (PAXIL) 40 MG tablet TAKE 1 TABLET BY MOUTH EVERY DAY IN THE MORNING 09/07/17  Yes Jegede, Olugbemiga E, MD    vitamin C (ASCORBIC ACID) 500 MG tablet Take 500 mg by mouth every morning.   Yes [provider]  metroNIDAZOLE (FLAGYL) 500 MG tablet Take 1 tablet (500 mg total) by mouth 2 (two) times daily. 12/23/17   Roxy Horseman, PA-C    Family History Family History  Problem Relation Age of Onset  . Hyperlipidemia Mother   . Diabetes Mother   . Hyperlipidemia Father   . Diabetes Father   . Heart disease Father     Social History Social History   Tobacco Use  . Smoking status: Former Smoker    Types: Cigarettes    Last attempt to quit: 08/28/2016    Years since quitting: 1.3  . Smokeless tobacco: Never Used  Substance Use Topics  . Alcohol use: Yes    Comment: 2-3 glasses of vodka per day  . Drug use: Yes    Types: Marijuana    Comment: smokes every few days     Allergies   Patient has no known allergies.   Review of Systems Review of Systems  All other systems reviewed and are negative.    Physical  Exam Updated Vital Signs BP (!) 147/102 (BP Location: Left Arm)   Pulse 92   Temp 98.6 F (37 C) (Oral)   Resp 16   Ht 5' (1.524 m)   Wt 76.7 kg (169 lb)   LMP 04/18/2017   SpO2 97%   BMI 33.01 kg/m   Physical Exam  Constitutional: She is oriented to person, place, and time. She appears well-developed and well-nourished.  HENT:  Head: Normocephalic and atraumatic.  Eyes: Pupils are equal, round, and reactive to light. Conjunctivae and EOM are normal.  Neck: Normal range of motion. Neck supple.  Cardiovascular: Normal rate and regular rhythm. Exam reveals no gallop and no friction rub.  No murmur heard. Pulmonary/Chest: Effort normal and breath sounds normal. No respiratory distress. She has no wheezes. She has no rales. She exhibits no tenderness.  Abdominal: Soft. Bowel sounds are normal. She exhibits no distension and no mass. There is no tenderness. There is no rebound and no guarding.  Genitourinary:  Genitourinary Comments: Pelvic exam performed by  me with female chaperone present  Frothy white vaginal discharge, no tenderness on bimanual exam  Musculoskeletal: Normal range of motion. She exhibits no edema or tenderness.  Neurological: She is alert and oriented to person, place, and time.  Skin: Skin is warm and dry.  Psychiatric: She has a normal mood and affect. Her behavior is normal. Judgment and thought content normal.  Nursing note and vitals reviewed.    ED Treatments / Results  Labs (all labs ordered are listed, but only abnormal results are displayed) Labs Reviewed  WET PREP, GENITAL - Abnormal; Notable for the following components:      Result Value   Trich, Wet Prep PRESENT (*)    Clue Cells Wet Prep HPF POC PRESENT (*)    WBC, Wet Prep HPF POC MODERATE (*)    All other components within normal limits  URINALYSIS, ROUTINE W REFLEX MICROSCOPIC - Abnormal; Notable for the following components:   APPearance HAZY (*)    Leukocytes, UA MODERATE (*)    Bacteria, UA RARE (*)    All other components within normal limits  PREGNANCY, URINE  GC/CHLAMYDIA PROBE AMP () NOT AT [facility]    EKG None  Radiology No results found.  Procedures Procedures (including critical care time)  Medications Ordered in ED Medications  azithromycin (ZITHROMAX) tablet 1,000 mg (has no administration in time range)  cefTRIAXone (ROCEPHIN) injection 250 mg (has no administration in time range)     Initial Impression / Assessment and Plan / ED Course  I have reviewed the triage vital signs and the nursing notes.  Pertinent labs & imaging results that were available during my care of the patient were reviewed by me and considered in my medical decision making (see chart for details).     Patient with vaginal discharge.  Positive for trichomoniasis and bacterial vaginosis.  Will also treat for gonorrhea and chlamydia.  Regarding the insect bites, have recommended Benadryl and cleaning her car very well, she has been sleeping in  her car.  Vital signs are stable.  Is not pregnant.  Final Clinical Impressions(s) / ED Diagnoses   Final diagnoses:  Insect bite of back wall of thorax, unspecified laterality, initial encounter  Trichimoniasis  Vaginal discharge  Bacterial vaginosis    ED Discharge Orders        Ordered    metroNIDAZOLE (FLAGYL) 500 MG tablet  2 times daily     12/23/17 0456  Roxy HorsemanBrowning, Vian Fluegel, PA-C 12/23/17 0507    Nira Connardama, Pedro Eduardo, MD 12/24/17 [phone]

## 2017-12-25 ENCOUNTER — Other Ambulatory Visit: Payer: Self-pay | Admitting: Internal Medicine

## 2017-12-25 NOTE — Telephone Encounter (Signed)
Pt hasn't been seen since Dec, 2018. Will forward request to PCP.

## 2017-12-26 ENCOUNTER — Telehealth: Payer: Self-pay | Admitting: Family Medicine

## 2017-12-26 NOTE — Telephone Encounter (Signed)
Paroxetine pt request refill. Pt states [facility] on [address] sent refill request and we have not responded. [facility].

## 2017-12-27 NOTE — Telephone Encounter (Signed)
Tried to contact patient no answer 

## 2017-12-28 NOTE — Telephone Encounter (Signed)
Tried to contact patient no answer 

## 2017-12-29 NOTE — Telephone Encounter (Signed)
Unable to reach patient but the have appointment for next week

## 2018-01-02 ENCOUNTER — Ambulatory Visit: Payer: Medicaid Other | Admitting: Family Medicine

## 2018-01-05 ENCOUNTER — Ambulatory Visit (INDEPENDENT_AMBULATORY_CARE_PROVIDER_SITE_OTHER): Payer: Medicaid Other | Admitting: Family Medicine

## 2018-01-05 VITALS — BP 130/88 | HR 70 | Temp 98.0°F | Ht 60.0 in | Wt 165.0 lb

## 2018-01-05 DIAGNOSIS — Z131 Encounter for screening for diabetes mellitus: Secondary | ICD-10-CM | POA: Diagnosis not present

## 2018-01-05 DIAGNOSIS — F411 Generalized anxiety disorder: Secondary | ICD-10-CM

## 2018-01-05 DIAGNOSIS — R232 Flushing: Secondary | ICD-10-CM

## 2018-01-05 DIAGNOSIS — R8290 Unspecified abnormal findings in urine: Secondary | ICD-10-CM | POA: Diagnosis not present

## 2018-01-05 DIAGNOSIS — R110 Nausea: Secondary | ICD-10-CM | POA: Diagnosis not present

## 2018-01-05 DIAGNOSIS — E860 Dehydration: Secondary | ICD-10-CM

## 2018-01-05 DIAGNOSIS — Z09 Encounter for follow-up examination after completed treatment for conditions other than malignant neoplasm: Secondary | ICD-10-CM | POA: Diagnosis not present

## 2018-01-05 DIAGNOSIS — L299 Pruritus, unspecified: Secondary | ICD-10-CM | POA: Diagnosis not present

## 2018-01-05 LAB — POCT URINALYSIS DIP (MANUAL ENTRY)
Bilirubin, UA: NEGATIVE
Blood, UA: NEGATIVE
Glucose, UA: NEGATIVE mg/dL
Leukocytes, UA: NEGATIVE
Nitrite, UA: NEGATIVE
Protein Ur, POC: 100 mg/dL — AB
Spec Grav, UA: >=1.03 — AB (ref 1.010–1.025)
Urobilinogen, UA: 0.2 E.U./dL
pH, UA: 5.5 (ref 5.0–8.0)

## 2018-01-05 LAB — POCT GLYCOSYLATED HEMOGLOBIN (HGB A1C): Hemoglobin A1C: 5.7 % — AB (ref 4.0–5.6)

## 2018-01-05 MED ORDER — HYDROXYZINE HCL 10 MG PO TABS: 10.0000 mg | ORAL_TABLET | Freq: Three times a day (TID) | ORAL | 1 refills | Status: DC | PRN

## 2018-01-05 MED ORDER — PAROXETINE HCL 40 MG PO TABS: ORAL_TABLET | ORAL | 1 refills | Status: DC

## 2018-01-05 NOTE — Progress Notes (Signed)
Follow Up  Subjective:    Patient ID: Theresa Olson, female    DOB: 07/09/1970, 47 y.o.   MRN: [id number]  Chief Complaint  Patient presents with  . Follow-up    medication management   HPI Theresa Olson has a past medical history of Hypertension, Depression, and Anxiety. Theresa Olson is here today for follow up.   Current Status: Since her last office visit, Theresa Olson states that Theresa Olson has been having increased itching. Theresa Olson denies rash and skin breakdown.  Theresa Olson denies fevers, chills, fatigue, recent infections, weight loss, and night sweats.   Theresa Olson has not had any headaches, visual changes, dizziness, and falls.   No chest pain, heart palpitations, cough and shortness of breath reported.   No reports of GI problems such as vomiting, diarrhea, and constipation. Theresa Olson has no reports of blood in stools, dysuria and hematuria.   Theresa Olson has moderate anxiety today. Theresa Olson has not been taking Paxil lately. Theresa Olson  denies suicidal ideations, homicidal ideations, or auditory hallucinations.   Theresa Olson denies pain today.   Past Medical History:  Diagnosis Date  . Anxiety   . Depression   . Hypertension     Family History  Problem Relation Age of Onset  . Hyperlipidemia Mother   . Diabetes Mother   . Hyperlipidemia Father   . Diabetes Father   . Heart disease Father     Social History   Socioeconomic History  . Marital status: Single    Spouse name: Not on file  . Number of children: Not on file  . Years of education: Not on file  . Highest education level: Not on file  Occupational History  . Not on file  Social Needs  . Financial resource strain: Not on file  . Food insecurity:    Worry: Not on file    Inability: Not on file  . Transportation needs:    Medical: Not on file    Non-medical: Not on file  Tobacco Use  . Smoking status: Former Smoker    Types: Cigarettes    Last attempt to quit: 08/28/2016    Years since quitting: 1.3  . Smokeless tobacco: Never Used  Substance and Sexual Activity  .  Alcohol use: Yes    Comment: 2-3 glasses of vodka per day  . Drug use: Yes    Types: Marijuana    Comment: smokes every few days  . Sexual activity: Yes    Birth control/protection: None  Lifestyle  . Physical activity:    Days per week: Not on file    Minutes per session: Not on file  . Stress: Not on file  Relationships  . Social connections:    Talks on phone: Not on file    Gets together: Not on file    Attends religious service: Not on file    Active member of club or organization: Not on file    Attends meetings of clubs or organizations: Not on file    Relationship status: Not on file  . Intimate partner violence:    Fear of current or ex partner: Not on file    Emotionally abused: Not on file    Physically abused: Not on file    Forced sexual activity: Not on file  Other Topics Concern  . Not on file  Social History Narrative  . Not on file    Past Surgical History:  Procedure Laterality Date  . CESAREAN SECTION      Immunization History  Administered Date(s) Administered  .  Influenza,inj,Quad PF,6+ Mos 02/17/2014, 06/10/2015, 03/06/2017  . Tdap 03/01/2016    Current Meds  Medication Sig  . gabapentin (NEURONTIN) 300 MG capsule Take 1 capsule (300 mg total) by mouth 3 (three) times daily.  Marland Kitchen GLUTATHIONE PO Take 1 tablet by mouth daily.  . Methylsulfonylmethane (MSM) 1000 MG CAPS Take 1,000 mg by mouth daily.  Marland Kitchen PARoxetine (PAXIL) 40 MG tablet TAKE 1 TABLET BY MOUTH EVERY DAY IN THE MORNING  . vitamin C (ASCORBIC ACID) 500 MG tablet Take 500 mg by mouth every morning.  . [DISCONTINUED] PARoxetine (PAXIL) 40 MG tablet TAKE 1 TABLET BY MOUTH EVERY DAY IN THE MORNING    No Known Allergies   BP 130/88 (BP Location: Left Arm, Patient Position: Sitting, Cuff Size: Large)   Pulse 70   Temp 98 F (36.7 C) (Oral)   Ht 5' (1.524 m)   Wt 165 lb (74.8 kg)   LMP 04/18/2017   SpO2 100%   BMI 32.22 kg/m    Review of Systems  Constitutional: Negative.    HENT: Negative.   Eyes: Negative.   Respiratory: Negative.   Cardiovascular: Negative.   Gastrointestinal: Positive for abdominal distention and nausea.  Endocrine: Negative.   Genitourinary: Negative.   Musculoskeletal: Negative.   Skin: Negative.   Allergic/Immunologic: Negative.   Hematological: Negative.   Psychiatric/Behavioral: Positive for agitation. The patient is nervous/anxious and is hyperactive.    Objective:   Physical Exam  Constitutional: Theresa Olson is oriented to person, place, and time. Theresa Olson appears well-developed and well-nourished.  HENT:  Head: Normocephalic and atraumatic.  Right Ear: External ear normal.  Left Ear: External ear normal.  Nose: Nose normal.  Mouth/Throat: Oropharynx is clear and moist.  Eyes: Pupils are equal, round, and reactive to light. Conjunctivae and EOM are normal.  Neck: Normal range of motion. Neck supple.  Cardiovascular: Normal rate, regular rhythm, normal heart sounds and intact distal pulses.  Pulmonary/Chest: Effort normal and breath sounds normal.  Abdominal: Soft. Bowel sounds are normal. Theresa Olson exhibits distension.  Musculoskeletal: Normal range of motion.  Neurological: Theresa Olson is alert and oriented to person, place, and time.  Skin: Skin is warm and dry. Capillary refill takes less than 2 seconds.  Psychiatric: Theresa Olson has a normal mood and affect. Her behavior is normal. Judgment and thought content normal.  Nursing note and vitals reviewed.  Assessment & Plan:   1. Anxiety disorder, generalized Theresa Olson is having situational anxiety today because of family issues. We will re-initiate Paxil today.   2. Screening for diabetes mellitus Hgb A1c is stable at 5.7.  Theresa Olson will continue to decrease foods/beverages high in sugars and carbs and follow Heart Healthy or DASH diet. Increase physical activity to at least 30 minutes cardio exercise daily.   - POCT glycosylated hemoglobin (Hb A1C) - POCT urinalysis dipstick  3. Itching We will initate  Atarax today. - hydrOXYzine (ATARAX/VISTARIL) 10 MG tablet; Take 1 tablet (10 mg total) by mouth 3 (three) times daily as needed.  Dispense: 90 tablet; Refill: 1  4. Abnormal urinalysis - Urine Culture  5. Dehydration, mild Theresa Olson will increase fluids and avoid extreme heat.  6. Hot flashes Probable r/t pre-menopausal. Theresa Olson will continue Gabapentin as prescribed.   7. Nausea Stable.   8. Follow up Theresa Olson will follow up in 1 month.   Meds ordered this encounter  Medications  . PARoxetine (PAXIL) 40 MG tablet    Sig: TAKE 1 TABLET BY MOUTH EVERY DAY IN THE MORNING    Dispense:  30 tablet    Refill:  1  . hydrOXYzine (ATARAX/VISTARIL) 10 MG tablet    Sig: Take 1 tablet (10 mg total) by mouth 3 (three) times daily as needed.    Dispense:  90 tablet    Refill:  1   Raliegh Ip,  MSN, FNP-C Patient [facility] [address] [place], [place] [address] [phone]

## 2018-01-05 NOTE — Patient Instructions (Signed)
Hydroxyzine capsules or tablets What is this medicine? HYDROXYZINE (hye DROX i zeen) is an antihistamine. This medicine is used to treat allergy symptoms. It is also used to treat anxiety and tension. This medicine can be used with other medicines to induce sleep before surgery. This medicine may be used for other purposes; ask your health care provider or pharmacist if you have questions. COMMON BRAND NAME(S): ANX, Atarax, Rezine, Vistaril What should I tell my health care provider before I take this medicine? They need to know if you have any of these conditions: -any chronic illness -difficulty passing urine -glaucoma -heart disease -kidney disease -liver disease -lung disease -an unusual or allergic reaction to hydroxyzine, cetirizine, other medicines, foods, dyes, or preservatives -pregnant or trying to get pregnant -breast-feeding How should I use this medicine? Take this medicine by mouth with a full glass of water. Follow the directions on the prescription label. You may take this medicine with food or on an empty stomach. Take your medicine at regular intervals. Do not take your medicine more often than directed. Talk to your pediatrician regarding the use of this medicine in children. Special care may be needed. While this drug may be prescribed for children as young as 6 years of age for selected conditions, precautions do apply. Patients over 65 years old may have a stronger reaction and need a smaller dose. Overdosage: If you think you have taken too much of this medicine contact a poison control center or emergency room at once. NOTE: This medicine is only for you. Do not share this medicine with others. What if I miss a dose? If you miss a dose, take it as soon as you can. If it is almost time for your next dose, take only that dose. Do not take double or extra doses. What may interact with this medicine? -alcohol -barbiturate medicines for sleep or seizures -medicines for  colds, allergies -medicines for depression, anxiety, or emotional disturbances -medicines for pain -medicines for sleep -muscle relaxants This list may not describe all possible interactions. Give your health care provider a list of all the medicines, herbs, non-prescription drugs, or dietary supplements you use. Also tell them if you smoke, drink alcohol, or use illegal drugs. Some items may interact with your medicine. What should I watch for while using this medicine? Tell your doctor or health care professional if your symptoms do not improve. You may get drowsy or dizzy. Do not drive, use machinery, or do anything that needs mental alertness until you know how this medicine affects you. Do not stand or sit up quickly, especially if you are an older patient. This reduces the risk of dizzy or fainting spells. Alcohol may interfere with the effect of this medicine. Avoid alcoholic drinks. Your mouth may get dry. Chewing sugarless gum or sucking hard candy, and drinking plenty of water may help. Contact your doctor if the problem does not go away or is severe. This medicine may cause dry eyes and blurred vision. If you wear contact lenses you may feel some discomfort. Lubricating drops may help. See your eye doctor if the problem does not go away or is severe. If you are receiving skin tests for allergies, tell your doctor you are using this medicine. What side effects may I notice from receiving this medicine? Side effects that you should report to your doctor or health care professional as soon as possible: -fast or irregular heartbeat -difficulty passing urine -seizures -slurred speech or confusion -tremor Side effects that   usually do not require medical attention (report to your doctor or health care professional if they continue or are bothersome): -constipation -drowsiness -fatigue -headache -stomach upset This list may not describe all possible side effects. Call your doctor for  medical advice about side effects. You may report side effects to FDA at 1-800-FDA-1088. Where should I keep my medicine? Keep out of the reach of children. Store at room temperature between 15 and 30 degrees C (59 and 86 degrees F). Keep container tightly closed. Throw away any unused medicine after the expiration date. NOTE: This sheet is a summary. It may not cover all possible information. If you have questions about this medicine, talk to your doctor, pharmacist, or health care provider.  2018 Elsevier/Gold Standard (2007-09-28 14:50:59)  

## 2018-01-06 ENCOUNTER — Encounter: Payer: Self-pay | Admitting: Family Medicine

## 2018-01-07 LAB — URINE CULTURE

## 2018-02-05 ENCOUNTER — Ambulatory Visit: Payer: Medicaid Other | Admitting: Family Medicine

## 2018-03-10 IMAGING — DX DG ABDOMEN 1V
1 series · 1 of 1 positions shown · non-contrast
Comparison: 12/07/2016 abdominal radiograph

CLINICAL DATA: Abdominal bloating

EXAM:
ABDOMEN - 1 VIEW

[abdomen kub]
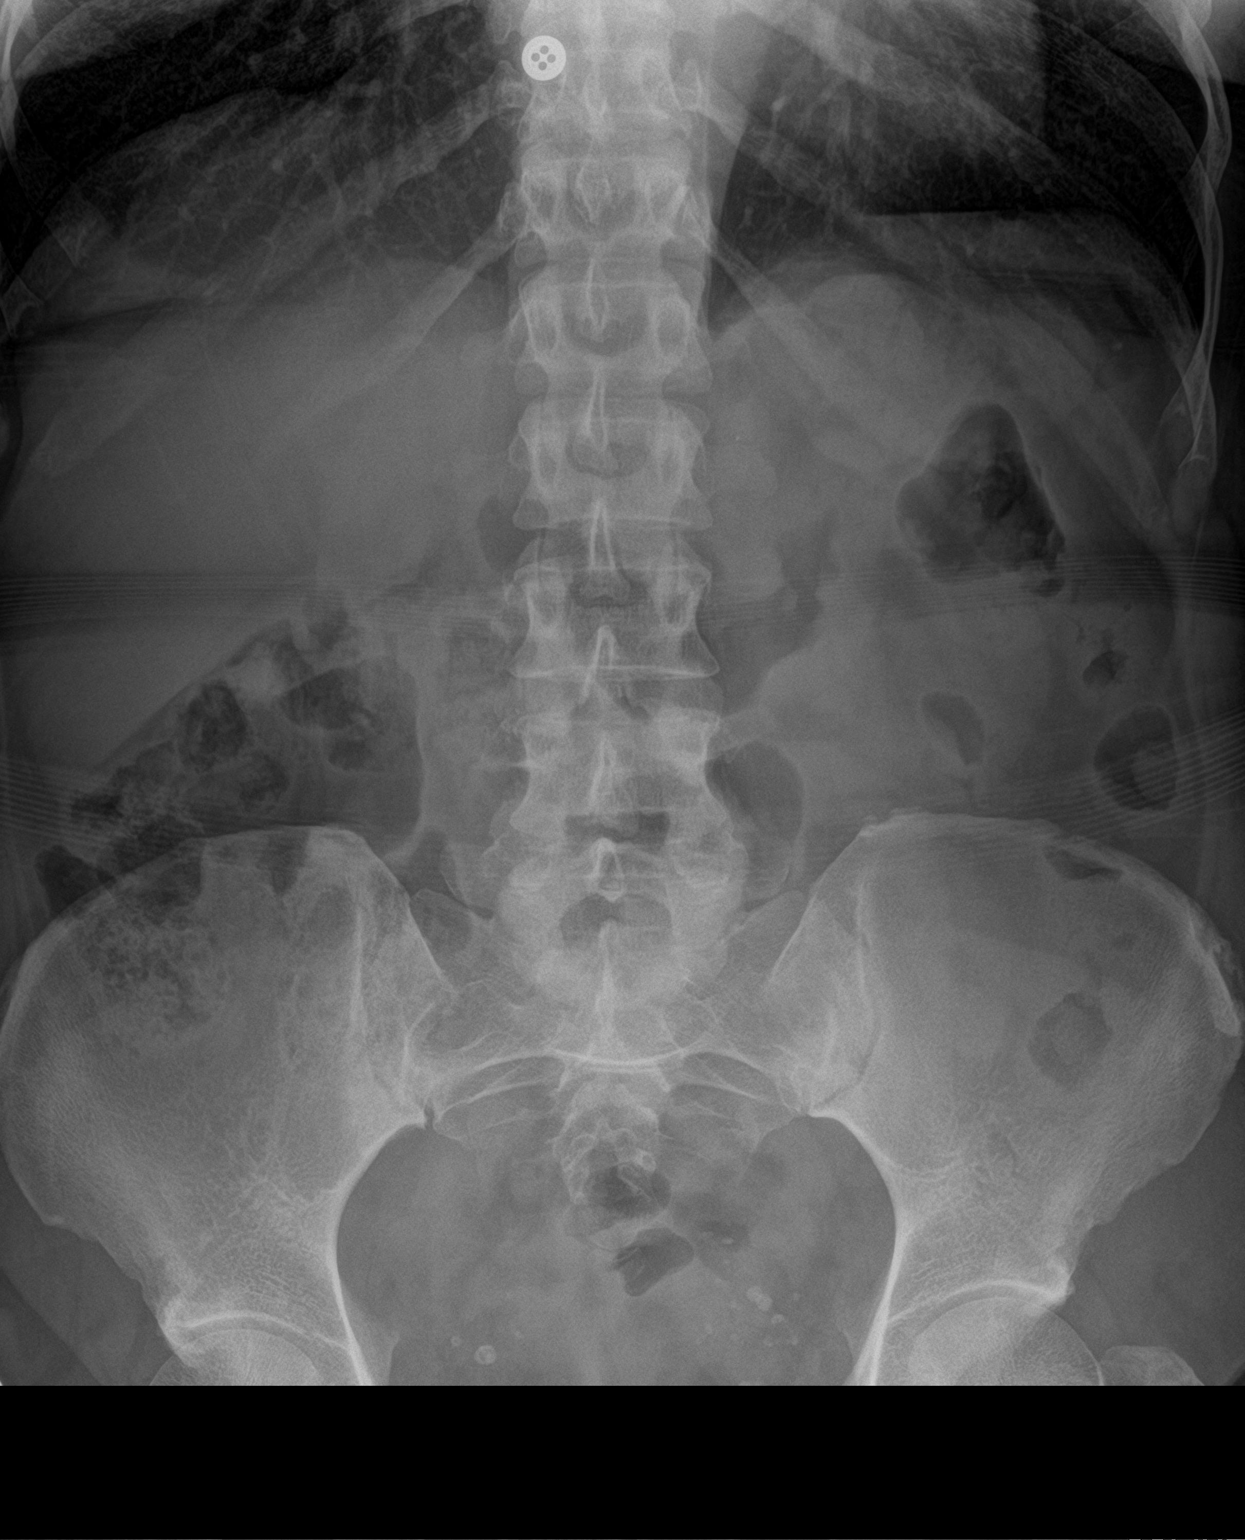

[1 of 1 positions shown; findings below may reference images not displayed]

FINDINGS: No disproportionately dilated small bowel loops. Mild-to-moderate
colonic stool volume. No evidence of pneumatosis or
pneumoperitoneum. No radiopaque nephrolithiasis. Scattered
calcifications throughout the deep pelvis, favor calcified venous
phleboliths. Clear lung bases.
IMPRESSION: Nonobstructive bowel gas pattern. Mild-to-moderate colonic stool
volume.

## 2018-03-22 ENCOUNTER — Other Ambulatory Visit: Payer: Self-pay | Admitting: Family Medicine

## 2018-03-22 NOTE — Telephone Encounter (Signed)
[facility] does not refill for this provider. 

## 2018-03-28 ENCOUNTER — Other Ambulatory Visit: Payer: Self-pay

## 2018-03-28 MED ORDER — PAROXETINE HCL 40 MG PO TABS: ORAL_TABLET | ORAL | 0 refills | Status: DC

## 2018-03-28 NOTE — Telephone Encounter (Signed)
Patient has been given a 15 day supply

## 2018-04-09 ENCOUNTER — Ambulatory Visit: Payer: Medicaid Other | Admitting: Family Medicine

## 2018-04-11 ENCOUNTER — Ambulatory Visit (INDEPENDENT_AMBULATORY_CARE_PROVIDER_SITE_OTHER): Payer: Medicaid Other | Admitting: Family Medicine

## 2018-04-11 ENCOUNTER — Ambulatory Visit (HOSPITAL_COMMUNITY)
Admission: RE | Admit: 2018-04-11 | Discharge: 2018-04-11 | Disposition: A | Discharge status: Home / Self Care | Payer: Medicaid Other | Source: Ambulatory Visit | Attending: Family Medicine | Admitting: Family Medicine

## 2018-04-11 ENCOUNTER — Encounter: Payer: Self-pay | Admitting: Family Medicine

## 2018-04-11 VITALS — BP 144/80 | HR 84 | Temp 98.0°F | Ht 60.0 in | Wt 165.6 lb

## 2018-04-11 DIAGNOSIS — S6992XA Unspecified injury of left wrist, hand and finger(s), initial encounter: Secondary | ICD-10-CM | POA: Diagnosis not present

## 2018-04-11 DIAGNOSIS — M79645 Pain in left finger(s): Secondary | ICD-10-CM

## 2018-04-11 DIAGNOSIS — Z23 Encounter for immunization: Secondary | ICD-10-CM

## 2018-04-11 DIAGNOSIS — F411 Generalized anxiety disorder: Secondary | ICD-10-CM | POA: Diagnosis not present

## 2018-04-11 DIAGNOSIS — G5601 Carpal tunnel syndrome, right upper limb: Secondary | ICD-10-CM

## 2018-04-11 DIAGNOSIS — Z09 Encounter for follow-up examination after completed treatment for conditions other than malignant neoplasm: Secondary | ICD-10-CM | POA: Diagnosis not present

## 2018-04-11 DIAGNOSIS — L299 Pruritus, unspecified: Secondary | ICD-10-CM

## 2018-04-11 LAB — POCT URINALYSIS DIP (MANUAL ENTRY)
Bilirubin, UA: NEGATIVE
Blood, UA: NEGATIVE
Glucose, UA: NEGATIVE mg/dL
Ketones, POC UA: NEGATIVE mg/dL
Leukocytes, UA: NEGATIVE
Nitrite, UA: NEGATIVE
Spec Grav, UA: 1.015 (ref 1.010–1.025)
Urobilinogen, UA: 0.2 E.U./dL
pH, UA: 7 (ref 5.0–8.0)

## 2018-04-11 MED ORDER — HYDROXYZINE HCL 10 MG PO TABS: 10.0000 mg | ORAL_TABLET | Freq: Three times a day (TID) | ORAL | 6 refills | Status: DC | PRN

## 2018-04-11 MED ORDER — GABAPENTIN 300 MG PO CAPS: 300.0000 mg | ORAL_CAPSULE | Freq: Three times a day (TID) | ORAL | 6 refills | Status: DC

## 2018-04-11 MED ORDER — PAROXETINE HCL 40 MG PO TABS: ORAL_TABLET | ORAL | 6 refills | Status: DC

## 2018-04-11 NOTE — Progress Notes (Signed)
Follow Up  Subjective:    Patient ID: Theresa Olson, female    DOB: 09/28/1970, 47 y.o.   MRN: [id number]   Chief Complaint  Patient presents with  . Follow-up    Anxiety  . Hand Pain    HPI  Theresa Olson is a 47 year old female with a past medical history of Hypertension, Depression, and Anxiety. She is here today for follow up of chronic diseases.   Current Status: Since her last office visit. Today her anxiety level is elevated. She has family and situational anxiety with being homeless. She has began working a month ago, she has recently gotten approval for an apartment, and things are beginning to get better. She has a injury to her left finger, around 03/20/2018, in which pain has not improved. She denies visual changes, chest pain, cough, shortness of breath, heart palpitations, and falls. She has occasionally headaches and dizziness with position changes. Denies severe headaches, confusion, seizures, double vision, and blurred vision, nausea and vomiting.  She denies fevers, chills, recent infections, weight loss, and night sweats. No reports of GI problems such as nausea, vomiting, diarrhea, and constipation. She has no reports of blood in stools, dysuria and hematuria. No depression or anxiety reported. She denies pain today.   Review of Systems  Constitutional: Negative.   HENT: Negative.   Eyes: Negative.   Respiratory: Negative.   Cardiovascular: Negative.   Gastrointestinal: Negative.   Endocrine: Negative.   Genitourinary: Negative.   Musculoskeletal: Positive for arthralgias (left little finger pain, swelling. ).  Allergic/Immunologic: Negative.   Neurological: Negative.   Hematological: Negative.   Psychiatric/Behavioral: Negative.    Objective:   Physical Exam  Constitutional: She is oriented to person, place, and time. She appears well-developed and well-nourished.  HENT:  Head: Normocephalic and atraumatic.  Eyes: Pupils are equal, round, and reactive to  light. Conjunctivae and EOM are normal.  Neck: Normal range of motion. Neck supple.  Cardiovascular: Normal rate, regular rhythm, normal heart sounds and intact distal pulses.  Pulmonary/Chest: Effort normal and breath sounds normal.  Abdominal: Soft. Bowel sounds are normal.  Neurological: She is alert and oriented to person, place, and time.  Skin: Skin is warm and dry.  Psychiatric:  Increased family anxiety.  Vitals reviewed.  Assessment & Plan:   1. Pain in left finger(s) We will ge X-ray today to r/u injury.  - DG Finger Little Left; Future  2. Carpal tunnel syndrome on right Stable.   3. Itching - hydrOXYzine (ATARAX/VISTARIL) 10 MG tablet; Take 1 tablet (10 mg total) by mouth 3 (three) times daily as needed.  Dispense: 90 tablet; Refill: 6  4. ANXIETY DISORDER, GENERALIZED Continue Hydroxyzine as prescribed.   5. Need for immunization against influenza - Flu Vaccine QUAD 36+ mos IM  6. Follow up She will follow up in 6 months.  - POCT urinalysis dipstick  Meds ordered this encounter  Medications  . PARoxetine (PAXIL) 40 MG tablet    Sig: TAKE 1 TABLET BY MOUTH EVERY DAY IN THE MORNING    Dispense:  30 tablet    Refill:  6  . gabapentin (NEURONTIN) 300 MG capsule    Sig: Take 1 capsule (300 mg total) by mouth 3 (three) times daily.    Dispense:  90 capsule    Refill:  6  . hydrOXYzine (ATARAX/VISTARIL) 10 MG tablet    Sig: Take 1 tablet (10 mg total) by mouth 3 (three) times daily as needed.    Dispense:  90 tablet    Refill:  6    Raliegh Ip,  MSN, [facility] Patient [facility] [address] [place], [place] [address] [phone]

## 2018-04-13 ENCOUNTER — Telehealth: Payer: Self-pay

## 2018-04-13 NOTE — Telephone Encounter (Signed)
-----   Message from Natalie M Stroud, FNP sent at 04/12/2018 10:06 PM EST ----- Regarding: "X-ray Results" X-ray of finger is negative for fracture. Do we have supplies to splint her finger in clinic? Please inform patient.   Thanks.  

## 2018-04-13 NOTE — Telephone Encounter (Signed)
Left a vm for patient to callback 

## 2018-04-16 NOTE — Telephone Encounter (Signed)
-----   Message from Kallie LocksNatalie M Stroud, FNP sent at 04/12/2018 10:06 PM EST ----- Regarding: "X-ray Results" X-ray of finger is negative for fracture. Do we have supplies to splint her finger in clinic? Please inform patient.   Thanks.

## 2018-04-16 NOTE — Telephone Encounter (Signed)
Left a vm for patient to callback 

## 2018-04-17 NOTE — Telephone Encounter (Signed)
Letter sent to patient as I have been unable to reach by phone.

## 2018-04-17 NOTE — Telephone Encounter (Signed)
-----   Message from Natalie M Stroud, FNP sent at 04/12/2018 10:06 PM EST ----- Regarding: "X-ray Results" X-ray of finger is negative for fracture. Do we have supplies to splint her finger in clinic? Please inform patient.   Thanks.  

## 2018-10-10 ENCOUNTER — Ambulatory Visit: Payer: Medicaid Other | Admitting: Family Medicine

## 2018-12-29 DIAGNOSIS — E559 Vitamin D deficiency, unspecified: Secondary | ICD-10-CM

## 2018-12-29 HISTORY — DX: Vitamin D deficiency, unspecified: E55.9

## 2019-01-23 ENCOUNTER — Other Ambulatory Visit: Payer: Self-pay

## 2019-01-23 ENCOUNTER — Telehealth: Payer: Self-pay | Admitting: Family Medicine

## 2019-01-24 ENCOUNTER — Telehealth: Payer: Self-pay

## 2019-01-24 NOTE — Telephone Encounter (Signed)
Called and lm on voicemail detail about the request for medication refill , asked pt call us back to set up an appt. So that we can send enough medication last her until she is able to come in for an appt.

## 2019-01-24 NOTE — Telephone Encounter (Signed)
-----   Message from Theresa Olson, Speed sent at 01/23/2019  8:32 PM EDT ----- Regarding: "Refill" Patient is requesting refill on Paxil. However, she has had 4 no-shows. Please assess if she has any medication left. She will need to schedule a follow up appointment, afterwards, you can give her enough medication with no refills, until her appointment. Thank you.

## 2019-01-28 ENCOUNTER — Encounter: Payer: Self-pay | Admitting: Family Medicine

## 2019-01-28 ENCOUNTER — Ambulatory Visit (INDEPENDENT_AMBULATORY_CARE_PROVIDER_SITE_OTHER): Payer: Medicaid Other | Admitting: Family Medicine

## 2019-01-28 ENCOUNTER — Other Ambulatory Visit: Payer: Self-pay

## 2019-01-28 VITALS — BP 135/85 | HR 59 | Temp 98.9°F | Ht 60.0 in | Wt 176.8 lb

## 2019-01-28 DIAGNOSIS — Z09 Encounter for follow-up examination after completed treatment for conditions other than malignant neoplasm: Secondary | ICD-10-CM

## 2019-01-28 DIAGNOSIS — Z1239 Encounter for other screening for malignant neoplasm of breast: Secondary | ICD-10-CM

## 2019-01-28 DIAGNOSIS — L299 Pruritus, unspecified: Secondary | ICD-10-CM

## 2019-01-28 DIAGNOSIS — F411 Generalized anxiety disorder: Secondary | ICD-10-CM | POA: Diagnosis not present

## 2019-01-28 DIAGNOSIS — G4700 Insomnia, unspecified: Secondary | ICD-10-CM

## 2019-01-28 DIAGNOSIS — F410 Panic disorder [episodic paroxysmal anxiety] without agoraphobia: Secondary | ICD-10-CM | POA: Insufficient documentation

## 2019-01-28 DIAGNOSIS — G5601 Carpal tunnel syndrome, right upper limb: Secondary | ICD-10-CM | POA: Diagnosis not present

## 2019-01-28 DIAGNOSIS — Z0000 Encounter for general adult medical examination without abnormal findings: Secondary | ICD-10-CM | POA: Diagnosis not present

## 2019-01-28 MED ORDER — PAROXETINE HCL 40 MG PO TABS: ORAL_TABLET | ORAL | 6 refills | Status: DC

## 2019-01-28 MED ORDER — BUSPIRONE HCL 10 MG PO TABS: 10.0000 mg | ORAL_TABLET | Freq: Two times a day (BID) | ORAL | 3 refills | Status: DC

## 2019-01-28 MED ORDER — ALPRAZOLAM 1 MG PO TABS: 1.0000 mg | ORAL_TABLET | Freq: Two times a day (BID) | ORAL | 0 refills | Status: DC | PRN

## 2019-01-28 MED ORDER — GABAPENTIN 300 MG PO CAPS: 300.0000 mg | ORAL_CAPSULE | Freq: Three times a day (TID) | ORAL | 6 refills | Status: DC

## 2019-01-28 MED ORDER — ALPRAZOLAM 1 MG PO TABS: 1.0000 mg | ORAL_TABLET | Freq: Every evening | ORAL | 0 refills | Status: DC | PRN

## 2019-01-28 MED ORDER — HYDROXYZINE HCL 10 MG PO TABS: 10.0000 mg | ORAL_TABLET | Freq: Three times a day (TID) | ORAL | 6 refills | Status: DC | PRN

## 2019-01-28 NOTE — Telephone Encounter (Signed)
Sent to nurse

## 2019-01-28 NOTE — Patient Instructions (Signed)
Living With Anxiety  After being diagnosed with an anxiety disorder, you may be relieved to know why you have felt or behaved a certain way. It is natural to also feel overwhelmed about the treatment ahead and what it will mean for your life. With care and support, you can manage this condition and recover from it. How to cope with anxiety Dealing with stress Stress is your body's reaction to life changes and events, both good and bad. Stress can last just a few hours or it can be ongoing. Stress can play a major role in anxiety, so it is important to learn both how to cope with stress and how to think about it differently. Talk with your health care provider or a counselor to learn more about stress reduction. He or she may suggest some stress reduction techniques, such as:  Music therapy. This can include creating or listening to music that you enjoy and that inspires you.  Mindfulness-based meditation. This involves being aware of your normal breaths, rather than trying to control your breathing. It can be done while sitting or walking.  Centering prayer. This is a kind of meditation that involves focusing on a word, phrase, or sacred image that is meaningful to you and that brings you peace.  Deep breathing. To do this, expand your stomach and inhale slowly through your nose. Hold your breath for 3-5 seconds. Then exhale slowly, allowing your stomach muscles to relax.  Self-talk. This is a skill where you identify thought patterns that lead to anxiety reactions and correct those thoughts.  Muscle relaxation. This involves tensing muscles then relaxing them. Choose a stress reduction technique that fits your lifestyle and personality. Stress reduction techniques take time and practice. Set aside 5-15 minutes a day to do them. Therapists can offer training in these techniques. The training may be covered by some insurance plans. Other things you can do to manage stress include:  Keeping a  stress diary. This can help you learn what triggers your stress and ways to control your response.  Thinking about how you respond to certain situations. You may not be able to control everything, but you can control your reaction.  Making time for activities that help you relax, and not feeling guilty about spending your time in this way. Therapy combined with coping and stress-reduction skills provides the best chance for successful treatment. Medicines Medicines can help ease symptoms. Medicines for anxiety include:  Anti-anxiety drugs.  Antidepressants.  Beta-blockers. Medicines may be used as the main treatment for anxiety disorder, along with therapy, or if other treatments are not working. Medicines should be prescribed by a health care provider. Relationships Relationships can play a big part in helping you recover. Try to spend more time connecting with trusted friends and family members. Consider going to couples counseling, taking family education classes, or going to family therapy. Therapy can help you and others better understand the condition. How to recognize changes in your condition Everyone has a different response to treatment for anxiety. Recovery from anxiety happens when symptoms decrease and stop interfering with your daily activities at home or work. This may mean that you will start to:  Have better concentration and focus.  Sleep better.  Be less irritable.  Have more energy.  Have improved memory. It is important to recognize when your condition is getting worse. Contact your health care provider if your symptoms interfere with home or work and you do not feel like your condition is improving. Where   to find help and support: You can get help and support from these sources:  Self-help groups.  Online and [employer].  A trusted spiritual leader.  Couples counseling.  Family education classes.  Family therapy. Follow these instructions  at home:  Eat a healthy diet that includes plenty of vegetables, fruits, whole grains, low-fat dairy products, and lean protein. Do not eat a lot of foods that are high in solid fats, added sugars, or salt.  Exercise. Most adults should do the following: ? Exercise for at least 150 minutes each week. The exercise should increase your heart rate and make you sweat (moderate-intensity exercise). ? Strengthening exercises at least twice a week.  Cut down on caffeine, tobacco, alcohol, and other potentially harmful substances.  Get the right amount and quality of sleep. Most adults need 7-9 hours of sleep each night.  Make choices that simplify your life.  Take over-the-counter and prescription medicines only as told by your health care provider.  Avoid caffeine, alcohol, and certain over-the-counter cold medicines. These may make you feel worse. Ask your pharmacist which medicines to avoid.  Keep all follow-up visits as told by your health care provider. This is important. Questions to ask your health care provider  Would I benefit from therapy?  How often should I follow up with a health care provider?  How long do I need to take medicine?  Are there any long-term side effects of my medicine?  Are there any alternatives to taking medicine? Contact a health care provider if:  You have a hard time staying focused or finishing daily tasks.  You spend many hours a day feeling worried about everyday life.  You become exhausted by worry.  You start to have headaches, feel tense, or have nausea.  You urinate more than normal.  You have diarrhea. Get help right away if:  You have a racing heart and shortness of breath.  You have thoughts of hurting yourself or others. If you ever feel like you may hurt yourself or others, or have thoughts about taking your own life, get help right away. You can go to your nearest emergency department or call:  Your local emergency services  (911 in the [place]  A suicide crisis helpline, such as the National Suicide Prevention Lifeline at 210-325-0740. This is open 24-hours a day. Summary  Taking steps to deal with stress can help calm you.  Medicines cannot cure anxiety disorders, but they can help ease symptoms.  Family, friends, and partners can play a big part in helping you recover from an anxiety disorder. This information is not intended to replace advice given to you by your health care provider. Make sure you discuss any questions you have with your health care provider. Document Released: 05/10/2016 Document Revised: 04/28/2017 Document Reviewed: 05/10/2016 Elsevier Patient Education  2020 Else[employer] Inc[employer] Buspirone tablets What is this medicine? BUSPIRONE (byoo SPYE rone) is used to treat anxiety disorders. This medicine may be used for other purposes; ask your health care provider or pharmacist if you have questions. COMMON BRAND NAME(S): BuSpar What should I tell my health care provider before I take this medicine? They need to know if you have any of these conditions:  kidney or liver disease  an unusual or allergic reaction to buspirone, other medicines, foods, dyes, or preservatives  pregnant or trying to get pregnant  breast-feeding How should I use this medicine? Take this medicine by mouth with a glass of water. Follow the directions on the  prescription label. You may take this medicine with or without food. To ensure that this medicine always works the same way for you, you should take it either always with or always without food. Take your doses at regular intervals. Do not take your medicine more often than directed. Do not stop taking except on the advice of your doctor or health care professional. Talk to your pediatrician regarding the use of this medicine in children. Special care may be needed. Overdosage: If you think you have taken too much of this medicine contact a poison control center or  emergency room at once. NOTE: This medicine is only for you. Do not share this medicine with others. What if I miss a dose? If you miss a dose, take it as soon as you can. If it is almost time for your next dose, take only that dose. Do not take double or extra doses. What may interact with this medicine? Do not take this medicine with any of the following medications:  linezolid  MAOIs like Carbex, Eldepryl, Marplan, Nardil, and Parnate  methylene blue  procarbazine This medicine may also interact with the following medications:  diazepam  digoxin  diltiazem  erythromycin  grapefruit juice  haloperidol  medicines for mental depression or mood problems  medicines for seizures like carbamazepine, phenobarbital and phenytoin  nefazodone  other medications for anxiety  rifampin  ritonavir  some antifungal medicines like itraconazole, ketoconazole, and voriconazole  verapamil  warfarin This list may not describe all possible interactions. Give your health care provider a list of all the medicines, herbs, non-prescription drugs, or dietary supplements you use. Also tell them if you smoke, drink alcohol, or use illegal drugs. Some items may interact with your medicine. What should I watch for while using this medicine? Visit your doctor or health care professional for regular checks on your progress. It may take 1 to 2 weeks before your anxiety gets better. You may get drowsy or dizzy. Do not drive, use machinery, or do anything that needs mental alertness until you know how this drug affects you. Do not stand or sit up quickly, especially if you are an older patient. This reduces the risk of dizzy or fainting spells. Alcohol can make you more drowsy and dizzy. Avoid alcoholic drinks. What side effects may I notice from receiving this medicine? Side effects that you should report to your doctor or health care professional as soon as possible:  blurred vision or other  vision changes  chest pain  confusion  difficulty breathing  feelings of hostility or anger  muscle aches and pains  numbness or tingling in hands or feet  ringing in the ears  skin rash and itching  vomiting  weakness Side effects that usually do not require medical attention (report to your doctor or health care professional if they continue or are bothersome):  disturbed dreams, nightmares  headache  nausea  restlessness or nervousness  sore throat and nasal congestion  stomach upset This list may not describe all possible side effects. Call your doctor for medical advice about side effects. You may report side effects to FDA at 1-800-FDA-1088. Where should I keep my medicine? Keep out of the reach of children. Store at room temperature below 30 degrees C (86 degrees F). Protect from light. Keep container tightly closed. Throw away any unused medicine after the expiration date. NOTE: This sheet is a summary. It may not cover all possible information. If you have questions about this medicine, talk  to your doctor, pharmacist, or health care provider.  2020 Elsevier/Gold Standard (2009-12-24 18:06:11)
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
## 2019-01-29 ENCOUNTER — Other Ambulatory Visit: Payer: Self-pay | Admitting: Family Medicine

## 2019-01-29 ENCOUNTER — Encounter: Payer: Self-pay | Admitting: Family Medicine

## 2019-01-29 ENCOUNTER — Telehealth: Payer: Self-pay

## 2019-01-29 DIAGNOSIS — E559 Vitamin D deficiency, unspecified: Secondary | ICD-10-CM

## 2019-01-29 LAB — CBC WITH DIFFERENTIAL/PLATELET
Basophils Absolute: 0.1 10*3/uL (ref 0.0–0.2)
Basos: 1 %
EOS (ABSOLUTE): 0.2 10*3/uL (ref 0.0–0.4)
Eos: 2 %
Hematocrit: 40.4 % (ref 34.0–46.6)
Hemoglobin: 13.5 g/dL (ref 11.1–15.9)
Immature Grans (Abs): 0 10*3/uL (ref 0.0–0.1)
Immature Granulocytes: 0 %
Lymphocytes Absolute: 2.8 10*3/uL (ref 0.7–3.1)
Lymphs: 29 %
MCH: 30.2 pg (ref 26.6–33.0)
MCHC: 33.4 g/dL (ref 31.5–35.7)
MCV: 90 fL (ref 79–97)
Monocytes Absolute: 0.7 10*3/uL (ref 0.1–0.9)
Monocytes: 8 %
Neutrophils Absolute: 5.6 10*3/uL (ref 1.4–7.0)
Neutrophils: 60 %
Platelets: 263 10*3/uL (ref 150–450)
RBC: 4.47 x10E6/uL (ref 3.77–5.28)
RDW: 13.4 % (ref 11.7–15.4)
WBC: 9.4 10*3/uL (ref 3.4–10.8)

## 2019-01-29 LAB — COMPREHENSIVE METABOLIC PANEL
ALT: 13 IU/L (ref 0–32)
AST: 22 IU/L (ref 0–40)
Albumin/Globulin Ratio: 1.5 (ref 1.2–2.2)
Albumin: 4.6 g/dL (ref 3.8–4.8)
Alkaline Phosphatase: 91 IU/L (ref 39–117)
BUN/Creatinine Ratio: 16 (ref 9–23)
BUN: 12 mg/dL (ref 6–24)
Bilirubin Total: 0.4 mg/dL (ref 0.0–1.2)
CO2: 21 mmol/L (ref 20–29)
Calcium: 9.6 mg/dL (ref 8.7–10.2)
Chloride: 104 mmol/L (ref 96–106)
Creatinine, Ser: 0.75 mg/dL (ref 0.57–1.00)
GFR calc Af Amer: 108 mL/min/{1.73_m2} (ref >59)
GFR calc non Af Amer: 94 mL/min/{1.73_m2} (ref >59)
Globulin, Total: 3 g/dL (ref 1.5–4.5)
Glucose: 112 mg/dL — ABNORMAL HIGH (ref 65–99)
Potassium: 4 mmol/L (ref 3.5–5.2)
Sodium: 141 mmol/L (ref 134–144)
Total Protein: 7.6 g/dL (ref 6.0–8.5)

## 2019-01-29 LAB — LIPID PANEL
Chol/HDL Ratio: 3.7 ratio (ref 0.0–4.4)
Cholesterol, Total: 228 mg/dL — ABNORMAL HIGH (ref 100–199)
HDL: 62 mg/dL (ref >39)
LDL Chol Calc (NIH): 134 mg/dL — ABNORMAL HIGH (ref 0–99)
Triglycerides: 182 mg/dL — ABNORMAL HIGH (ref 0–149)
VLDL Cholesterol Cal: 32 mg/dL (ref 5–40)

## 2019-01-29 LAB — TSH: TSH: 1.33 u[IU]/mL (ref 0.450–4.500)

## 2019-01-29 LAB — VITAMIN B12: Vitamin B-12: 990 pg/mL (ref 232–1245)

## 2019-01-29 LAB — VITAMIN D 25 HYDROXY (VIT D DEFICIENCY, FRACTURES): Vit D, 25-Hydroxy: 11.5 ng/mL — ABNORMAL LOW (ref 30.0–100.0)

## 2019-01-29 MED ORDER — VITAMIN D (ERGOCALCIFEROL) 1.25 MG (50000 UNIT) PO CAPS: 50000.0000 [IU] | ORAL_CAPSULE | ORAL | 6 refills | Status: DC

## 2019-01-29 NOTE — Telephone Encounter (Signed)
Patient called back regarding lab results. I have informed of vitamin D levels being low and to start once weekly vitamin D and we will recheck levels at next appointment. Patient verbalized understanding. Thanks!

## 2019-03-16 ENCOUNTER — Encounter (HOSPITAL_COMMUNITY): Payer: Self-pay | Admitting: Emergency Medicine

## 2019-03-16 ENCOUNTER — Other Ambulatory Visit: Payer: Self-pay

## 2019-03-16 ENCOUNTER — Emergency Department (HOSPITAL_COMMUNITY)
Admission: EM | Admit: 2019-03-16 | Discharge: 2019-03-17 | Disposition: A | Discharge status: Home / Self Care | Payer: Medicaid Other | Attending: Emergency Medicine | Admitting: Emergency Medicine

## 2019-03-16 DIAGNOSIS — G44209 Tension-type headache, unspecified, not intractable: Secondary | ICD-10-CM | POA: Diagnosis not present

## 2019-03-16 DIAGNOSIS — I10 Essential (primary) hypertension: Secondary | ICD-10-CM | POA: Diagnosis not present

## 2019-03-16 DIAGNOSIS — M545 Low back pain: Secondary | ICD-10-CM | POA: Insufficient documentation

## 2019-03-16 DIAGNOSIS — M62830 Muscle spasm of back: Secondary | ICD-10-CM

## 2019-03-16 DIAGNOSIS — F1210 Cannabis abuse, uncomplicated: Secondary | ICD-10-CM | POA: Insufficient documentation

## 2019-03-16 DIAGNOSIS — Z79899 Other long term (current) drug therapy: Secondary | ICD-10-CM | POA: Insufficient documentation

## 2019-03-16 DIAGNOSIS — M25512 Pain in left shoulder: Secondary | ICD-10-CM | POA: Insufficient documentation

## 2019-03-16 DIAGNOSIS — Z87891 Personal history of nicotine dependence: Secondary | ICD-10-CM | POA: Insufficient documentation
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
## 2019-03-16 NOTE — ED Triage Notes (Signed)
Pt reports having left lower back and hip pain that has been ongoing for the last 3 days. Pt also reports headaches for the last 2 weeks.

## 2019-03-16 NOTE — ED Notes (Signed)
Pt ambulated to the restroom without assistance

## 2019-03-17 MED ORDER — KETOROLAC TROMETHAMINE 60 MG/2ML IM SOLN
30.0000 mg | Freq: Once | INTRAMUSCULAR | Status: AC
  Administered 2019-03-17: 30 mg via INTRAMUSCULAR
  Filled 2019-03-17: qty 2
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
## 2019-03-17 NOTE — Discharge Instructions (Addendum)
You may use over-the-counter Motrin (Ibuprofen), Acetaminophen (Tylenol), topical muscle creams such as SalonPas, Icy Hot, Bengay, etc. Please stretch, apply heat, and have massage therapy for additional assistance. ° °

## 2019-03-17 NOTE — ED Notes (Signed)
Pt was verbalized discharge instructions. Pt had no further instructions at this time. NAD.

## 2019-04-02 ENCOUNTER — Ambulatory Visit: Payer: Medicaid Other | Admitting: Family Medicine

## 2019-04-30 ENCOUNTER — Ambulatory Visit: Payer: Medicaid Other | Admitting: Family Medicine

## 2019-06-17 ENCOUNTER — Other Ambulatory Visit: Payer: Self-pay

## 2019-06-17 DIAGNOSIS — Z0000 Encounter for general adult medical examination without abnormal findings: Secondary | ICD-10-CM

## 2019-06-18 ENCOUNTER — Ambulatory Visit: Payer: Medicaid Other | Admitting: Family Medicine

## 2019-06-22 ENCOUNTER — Other Ambulatory Visit: Payer: Self-pay | Admitting: Family Medicine

## 2019-06-22 DIAGNOSIS — G4700 Insomnia, unspecified: Secondary | ICD-10-CM

## 2019-06-22 DIAGNOSIS — F410 Panic disorder [episodic paroxysmal anxiety] without agoraphobia: Secondary | ICD-10-CM

## 2019-06-24 NOTE — Telephone Encounter (Signed)
Refill request for xanax. Please advise.  

## 2019-06-26 ENCOUNTER — Telehealth: Payer: Self-pay

## 2019-06-26 NOTE — Telephone Encounter (Signed)
Patient will be called by Behavior Health for an appointment in the next 8-10 weeks. Yes, she is willing to try another medication until her appointment.

## 2019-06-26 NOTE — Telephone Encounter (Signed)
-----   Message from Kallie Locks, FNP sent at 06/25/2019  4:54 PM EST ----- Regarding: "Refill Xanax" Patient requesting refill on Xanax. Last filled on 12/2018, and patient referred to Psychiatry at that time. Please assess why patient did not follow up with Psychiatry at that time. I will be able to prescribe a different medication for anxiety if needed. Thank you.

## 2019-06-27 ENCOUNTER — Other Ambulatory Visit: Payer: Self-pay | Admitting: Family Medicine

## 2019-06-27 ENCOUNTER — Telehealth: Payer: Self-pay

## 2019-06-27 DIAGNOSIS — F411 Generalized anxiety disorder: Secondary | ICD-10-CM

## 2019-06-27 MED ORDER — BUSPIRONE HCL 10 MG PO TABS: 10.0000 mg | ORAL_TABLET | Freq: Three times a day (TID) | ORAL | 3 refills | Status: DC

## 2019-06-27 NOTE — Telephone Encounter (Signed)
Patient informed. 

## 2019-07-05 ENCOUNTER — Ambulatory Visit (HOSPITAL_COMMUNITY): Payer: Self-pay | Admitting: Licensed Clinical Social Worker

## 2019-10-29 DIAGNOSIS — E785 Hyperlipidemia, unspecified: Secondary | ICD-10-CM

## 2019-10-29 HISTORY — DX: Hyperlipidemia, unspecified: E78.5

## 2019-11-06 ENCOUNTER — Other Ambulatory Visit: Payer: Self-pay | Admitting: Family Medicine

## 2019-11-06 ENCOUNTER — Telehealth: Payer: Self-pay | Admitting: Family Medicine

## 2019-11-06 DIAGNOSIS — E559 Vitamin D deficiency, unspecified: Secondary | ICD-10-CM

## 2019-11-06 DIAGNOSIS — F411 Generalized anxiety disorder: Secondary | ICD-10-CM

## 2019-11-06 NOTE — Telephone Encounter (Signed)
Is this okay to refill. Patient has an upcoming appointment

## 2019-11-08 ENCOUNTER — Ambulatory Visit (INDEPENDENT_AMBULATORY_CARE_PROVIDER_SITE_OTHER): Payer: Self-pay | Admitting: Family Medicine

## 2019-11-08 ENCOUNTER — Encounter: Payer: Self-pay | Admitting: Family Medicine

## 2019-11-08 ENCOUNTER — Other Ambulatory Visit: Payer: Self-pay

## 2019-11-08 ENCOUNTER — Other Ambulatory Visit: Payer: Self-pay | Admitting: Family Medicine

## 2019-11-08 VITALS — BP 134/90 | HR 107 | Temp 98.0°F | Ht 60.0 in | Wt 185.0 lb

## 2019-11-08 DIAGNOSIS — F411 Generalized anxiety disorder: Secondary | ICD-10-CM

## 2019-11-08 DIAGNOSIS — L299 Pruritus, unspecified: Secondary | ICD-10-CM

## 2019-11-08 DIAGNOSIS — F419 Anxiety disorder, unspecified: Secondary | ICD-10-CM

## 2019-11-08 DIAGNOSIS — R519 Headache, unspecified: Secondary | ICD-10-CM

## 2019-11-08 DIAGNOSIS — E559 Vitamin D deficiency, unspecified: Secondary | ICD-10-CM

## 2019-11-08 DIAGNOSIS — Z09 Encounter for follow-up examination after completed treatment for conditions other than malignant neoplasm: Secondary | ICD-10-CM

## 2019-11-08 DIAGNOSIS — F410 Panic disorder [episodic paroxysmal anxiety] without agoraphobia: Secondary | ICD-10-CM

## 2019-11-08 DIAGNOSIS — R232 Flushing: Secondary | ICD-10-CM

## 2019-11-08 DIAGNOSIS — Z0000 Encounter for general adult medical examination without abnormal findings: Secondary | ICD-10-CM

## 2019-11-08 DIAGNOSIS — G5601 Carpal tunnel syndrome, right upper limb: Secondary | ICD-10-CM

## 2019-11-08 DIAGNOSIS — G4700 Insomnia, unspecified: Secondary | ICD-10-CM

## 2019-11-08 MED ORDER — PREGABALIN 50 MG PO CAPS: 50.0000 mg | ORAL_CAPSULE | Freq: Two times a day (BID) | ORAL | 6 refills | Status: DC

## 2019-11-08 MED ORDER — BUSPIRONE HCL 10 MG PO TABS: 10.0000 mg | ORAL_TABLET | Freq: Three times a day (TID) | ORAL | 6 refills | Status: DC

## 2019-11-08 MED ORDER — PAROXETINE HCL 40 MG PO TABS: ORAL_TABLET | ORAL | 6 refills | Status: DC

## 2019-11-08 MED ORDER — HYDROXYZINE HCL 10 MG PO TABS: 10.0000 mg | ORAL_TABLET | Freq: Three times a day (TID) | ORAL | 6 refills | Status: DC | PRN

## 2019-11-08 NOTE — Telephone Encounter (Signed)
Pt has to be seen

## 2019-11-08 NOTE — Progress Notes (Signed)
[facility]   Established Patient Office Visit  Subjective:  Patient ID: Theresa Olson, female    DOB: October 30, 1970  Age: 49 y.o. MRN: [id number]  CC:  Chief Complaint  Patient presents with  . Follow-up    Pt stated --anxiety getting worse, and sweating. Last taken 5 days ago and requesting to increase the dose.    HPI Theresa Olson is a 49 year old female who presents for Follow Up today.   Past Medical History:  Diagnosis Date  . Anxiety   . Depression   . Hypertension   . Vitamin D deficiency 12/2018     Patient Active Problem List   Diagnosis Date Noted  . Anxiety attack 01/28/2019  . Carpal tunnel syndrome on right 01/28/2019  . Amenorrhea 11/17/2016  . Frequent No-show for appointment 08/11/2016  . Recurrent major depression-severe ([facility]) 11/02/2011  . TOBACCO USER 03/07/2009  . ANXIETY DISORDER, GENERALIZED 08/20/2008     Current Status: Since her last office visit, she is doing well with no complaints. She denies visual changes, chest pain, cough, shortness of breath, heart palpitations, and falls. She has occasional headaches and dizziness with position changes. Denies severe headaches, confusion, seizures, double vision, and blurred vision, nausea and vomiting. Her anxiety is increased today, r/t personal and family issues. She is very anxious and hyperactive. She states that she has not taken her medication X 1 week now. She denies suicidal ideations, homicidal ideations, or auditory hallucinations. She has not followed up with Psychiatry and not eager to do so. She is currently having increasing incidents of hot flashes. She denies fevers, chills, fatigue, recent infections, weight loss, and night sweats. She has not had any visual changes, and falls. No chest pain, heart palpitations, cough and shortness of breath reported. Denies GI problems such as diarrhea, and constipation. She has no reports of blood in  stools, dysuria and hematuria. She is taking all medications as prescribed. She denies pain today.   Past Surgical History:  Procedure Laterality Date  . CESAREAN SECTION      Family History  Problem Relation Age of Onset  . Hyperlipidemia Mother   . Diabetes Mother   . Hyperlipidemia Father   . Diabetes Father   . Heart disease Father     Social History   Socioeconomic History  . Marital status: Single    Spouse name: Not on file  . Number of children: Not on file  . Years of education: Not on file  . Highest education level: Not on file  Occupational History  . Not on file  Tobacco Use  . Smoking status: Former Smoker    Types: Cigarettes    Quit date: 08/28/2016    Years since quitting: 3.1  . Smokeless tobacco: Never Used  Substance and Sexual Activity  . Alcohol use: Yes    Comment: 2-3 glasses of vodka per day  . Drug use: Yes    Types: Marijuana    Comment: smokes every few days  . Sexual activity: Yes    Birth control/protection: None  Other Topics Concern  . Not on file  Social History Narrative  . Not on file   Social Determinants of Health   Financial Resource Strain:   . Difficulty of Paying Living Expenses:   Food Insecurity:   . Worried About Programme researcher, broadcasting/film/video in the Last Year:   . Barista in the Last Year:   [employer]  Needs:   . Lack of Transportation (Medical):   Marland Kitchen Lack of Transportation (Non-Medical):   Physical Activity:   . Days of Exercise per Week:   . Minutes of Exercise per Session:   Stress:   . Feeling of Stress :   Social Connections:   . Frequency of Communication with Friends and Family:   . Frequency of Social Gatherings with Friends and Family:   . Attends Religious Services:   . Active Member of Clubs or Organizations:   . Attends Archivist Meetings:   Marland Kitchen Marital Status:   Intimate Partner Violence:   . Fear of Current or Ex-Partner:   . Emotionally Abused:   Marland Kitchen Physically Abused:   . Sexually  Abused:     Outpatient Medications Prior to Visit  Medication Sig Dispense Refill  . gabapentin (NEURONTIN) 300 MG capsule Take 1 capsule (300 mg total) by mouth 3 (three) times daily. 90 capsule 6  . GLUTATHIONE PO Take 1 tablet by mouth daily.    . vitamin C (ASCORBIC ACID) 500 MG tablet Take 500 mg by mouth every morning.    . Vitamin D, Ergocalciferol, (DRISDOL) 1.25 MG (50000 UT) CAPS capsule Take 1 capsule (50,000 Units total) by mouth every 7 (seven) days. 5 capsule 6  . hydrOXYzine (ATARAX/VISTARIL) 10 MG tablet Take 1 tablet (10 mg total) by mouth 3 (three) times daily as needed. 90 tablet 6  . PARoxetine (PAXIL) 40 MG tablet TAKE 1 TABLET BY MOUTH EVERY DAY IN THE MORNING 30 tablet 6  . ALPRAZolam (XANAX) 1 MG tablet Take 1 tablet (1 mg total) by mouth at bedtime as needed for anxiety. 30 tablet 0  . Methylsulfonylmethane (MSM) 1000 MG CAPS Take 1,000 mg by mouth daily.     No facility-administered medications prior to visit.    No Known Allergies  ROS Review of Systems  Constitutional: Negative.   HENT: Negative.   Eyes: Negative.   Cardiovascular: Negative.   Gastrointestinal: Positive for abdominal distention.  Endocrine: Negative.   Genitourinary: Negative.   Musculoskeletal: Negative.   Skin: Negative.   Allergic/Immunologic: Negative.   Neurological: Positive for dizziness (occasional ) and headaches (occasional).  Hematological: Negative.   Psychiatric/Behavioral: Positive for sleep disturbance. The patient is nervous/anxious and is hyperactive.       Objective:    Physical Exam Vitals and nursing note reviewed.  Constitutional:      Appearance: She is obese.  HENT:     Head: Normocephalic and atraumatic.     Nose: Nose normal.     Mouth/Throat:     Mouth: Mucous membranes are dry.     Pharynx: Oropharynx is clear.  Cardiovascular:     Rate and Rhythm: Normal rate and regular rhythm.     Pulses: Normal pulses.     Heart sounds: Normal heart sounds.   Pulmonary:     Effort: Pulmonary effort is normal.  Abdominal:     General: Bowel sounds are normal. There is distension (obese).     Palpations: Abdomen is soft.  Musculoskeletal:        General: Normal range of motion.     Cervical back: Normal range of motion and neck supple.  Skin:    General: Skin is dry.     Capillary Refill: Capillary refill takes less than 2 seconds.  Neurological:     Mental Status: She is alert.  Psychiatric:        Mood and Affect: Mood is anxious.  Behavior: Behavior is agitated and hyperactive.     Comments: Very anxious and nervous today.     BP 134/90   Pulse (!) 107   Temp 98 F (36.7 C)   Ht 5' (1.524 m)   Wt 185 lb (83.9 kg)   LMP 04/18/2017   SpO2 98%   BMI 36.13 kg/m  Wt Readings from Last 3 Encounters:  11/08/19 185 lb (83.9 kg)  03/16/19 165 lb (74.8 kg)  01/28/19 176 lb 12.8 oz (80.2 kg)     Health Maintenance Due  Topic Date Due  . COVID-19 Vaccine (1) Never done  . PAP SMEAR-Modifier  06/04/2016  . MAMMOGRAM  Never done  . COLONOSCOPY  Never done    There are no preventive care reminders to display for this patient.  Lab Results  Component Value Date   TSH 1.330 01/28/2019   Lab Results  Component Value Date   WBC 9.4 01/28/2019   HGB 13.5 01/28/2019   HCT 40.4 01/28/2019   MCV 90 01/28/2019   PLT 263 01/28/2019   Lab Results  Component Value Date   NA 141 01/28/2019   K 4.0 01/28/2019   CO2 21 01/28/2019   GLUCOSE 112 (H) 01/28/2019   BUN 12 01/28/2019   CREATININE 0.75 01/28/2019   BILITOT 0.4 01/28/2019   ALKPHOS 91 01/28/2019   AST 22 01/28/2019   ALT 13 01/28/2019   PROT 7.6 01/28/2019   ALBUMIN 4.6 01/28/2019   CALCIUM 9.6 01/28/2019   ANIONGAP 8 12/18/2016   Lab Results  Component Value Date   CHOL 228 (H) 01/28/2019   Lab Results  Component Value Date   HDL 62 01/28/2019   Lab Results  Component Value Date   LDLCALC 134 (H) 01/28/2019   Lab Results  Component Value Date    TRIG 182 (H) 01/28/2019   Lab Results  Component Value Date   CHOLHDL 3.7 01/28/2019   Lab Results  Component Value Date   HGBA1C 5.7 (A) 01/05/2018   Assessment & Plan:   1. Anxiety We will initiate Pregabalin today. - pregabalin (LYRICA) 50 MG capsule; Take 1 capsule (50 mg total) by mouth 2 (two) times daily.  Dispense: 60 capsule; Refill: 6  2. Anxiety attack Stable today. She will continue medication as prescribed. We will refer her to Psychiatry today, and continue to encourage her to keep appointment.   3. ANXIETY DISORDER, GENERALIZED - hydrOXYzine (ATARAX/VISTARIL) 10 MG tablet; Take 1 tablet (10 mg total) by mouth 3 (three) times daily as needed.  Dispense: 90 tablet; Refill: 6 - PARoxetine (PAXIL) 40 MG tablet; TAKE 1 TABLET BY MOUTH EVERY DAY IN THE MORNING  Dispense: 30 tablet; Refill: 6 - busPIRone (BUSPAR) 10 MG tablet; Take 1 tablet (10 mg total) by mouth 3 (three) times daily.  Dispense: 90 tablet; Refill: 6  4. Insomnia, unspecified type  5. Carpal tunnel syndrome on right  6. Vitamin D deficiency  7. Nonintractable headache, unspecified chronicity pattern, unspecified headache type Stable.   8. Healthcare maintenance - CBC with Differential - Comprehensive metabolic panel - Lipid Panel - Vitamin B12 - Vitamin D, 25-hydroxy - TSH  9. Hot flashes - pregabalin (LYRICA) 50 MG capsule; Take 1 capsule (50 mg total) by mouth 2 (two) times daily.  Dispense: 60 capsule; Refill: 6  10. Itching - hydrOXYzine (ATARAX/VISTARIL) 10 MG tablet; Take 1 tablet (10 mg total) by mouth 3 (three) times daily as needed.  Dispense: 90 tablet; Refill: 6  11. Follow  up She will follow up with our clinic monthly for support  until her appointment with Behavioral Health in 12/2019. I will refer her to Abigail Butts, LCSW in our office for additional support.   Meds ordered this encounter  Medications  . pregabalin (LYRICA) 50 MG capsule    Sig: Take 1 capsule (50 mg total)  by mouth 2 (two) times daily.    Dispense:  60 capsule    Refill:  6  . hydrOXYzine (ATARAX/VISTARIL) 10 MG tablet    Sig: Take 1 tablet (10 mg total) by mouth 3 (three) times daily as needed.    Dispense:  90 tablet    Refill:  6  . PARoxetine (PAXIL) 40 MG tablet    Sig: TAKE 1 TABLET BY MOUTH EVERY DAY IN THE MORNING    Dispense:  30 tablet    Refill:  6  . busPIRone (BUSPAR) 10 MG tablet    Sig: Take 1 tablet (10 mg total) by mouth 3 (three) times daily.    Dispense:  90 tablet    Refill:  6    Orders Placed This Encounter  Procedures  . CBC with Differential  . Comprehensive metabolic panel  . Lipid Panel  . Vitamin B12  . Vitamin D, 25-hydroxy  . TSH    Referral Orders  No referral(s) requested today   Raliegh Ip,  MSN, FNP-BC [facility] [address] [place], [place] [address] [phone] fax   Problem List Items Addressed This Visit      Nervous and Auditory   Carpal tunnel syndrome on right   Relevant Medications   pregabalin (LYRICA) 50 MG capsule   hydrOXYzine (ATARAX/VISTARIL) 10 MG tablet   PARoxetine (PAXIL) 40 MG tablet   busPIRone (BUSPAR) 10 MG tablet     Other   Anxiety attack   Relevant Medications   hydrOXYzine (ATARAX/VISTARIL) 10 MG tablet   PARoxetine (PAXIL) 40 MG tablet   busPIRone (BUSPAR) 10 MG tablet   ANXIETY DISORDER, GENERALIZED   Relevant Medications   hydrOXYzine (ATARAX/VISTARIL) 10 MG tablet   PARoxetine (PAXIL) 40 MG tablet   busPIRone (BUSPAR) 10 MG tablet    Other Visit Diagnoses    Anxiety    -  Primary   Relevant Medications   pregabalin (LYRICA) 50 MG capsule   hydrOXYzine (ATARAX/VISTARIL) 10 MG tablet   PARoxetine (PAXIL) 40 MG tablet   busPIRone (BUSPAR) 10 MG tablet   Insomnia, unspecified type       Vitamin D deficiency       Nonintractable headache, unspecified chronicity pattern, unspecified headache type        Relevant Medications   pregabalin (LYRICA) 50 MG capsule   PARoxetine (PAXIL) 40 MG tablet   Healthcare maintenance       Relevant Orders   CBC with Differential   Comprehensive metabolic panel   Lipid Panel   Vitamin B12   Vitamin D, 25-hydroxy   TSH   Hot flashes       Relevant Medications   pregabalin (LYRICA) 50 MG capsule   Itching       Relevant Medications   hydrOXYzine (ATARAX/VISTARIL) 10 MG tablet   Follow up          Meds ordered this encounter  Medications  . pregabalin (LYRICA) 50 MG capsule    Sig: Take 1 capsule (50 mg total) by mouth 2 (two) times daily.    Dispense:  60 capsule    Refill:  6  . hydrOXYzine (ATARAX/VISTARIL) 10 MG tablet    Sig: Take 1 tablet (10 mg total) by mouth 3 (three) times daily as needed.    Dispense:  90 tablet    Refill:  6  . PARoxetine (PAXIL) 40 MG tablet    Sig: TAKE 1 TABLET BY MOUTH EVERY DAY IN THE MORNING    Dispense:  30 tablet    Refill:  6  . busPIRone (BUSPAR) 10 MG tablet    Sig: Take 1 tablet (10 mg total) by mouth 3 (three) times daily.    Dispense:  90 tablet    Refill:  6    Follow-up: No follow-ups on file.    Kallie LocksNatalie M Iasiah Ozment, FNP

## 2019-11-08 NOTE — Patient Instructions (Signed)
Generalized Anxiety Disorder, Adult Generalized anxiety disorder (GAD) is a mental health disorder. People with this condition constantly worry about everyday events. Unlike normal anxiety, worry related to GAD is not triggered by a specific event. These worries also do not fade or get better with time. GAD interferes with life functions, including relationships, work, and school. GAD can vary from mild to severe. People with severe GAD can have intense waves of anxiety with physical symptoms (panic attacks). What are the causes? The exact cause of GAD is not known. What increases the risk? This condition is more likely to develop in:  Women.  People who have a family history of anxiety disorders.  People who are very shy.  People who experience very stressful life events, such as the death of a loved one.  People who have a very stressful family environment. What are the signs or symptoms? People with GAD often worry excessively about many things in their lives, such as their health and family. They may also be overly concerned about:  Doing well at work.  Being on time.  Natural disasters.  Friendships. Physical symptoms of GAD include:  Fatigue.  Muscle tension or having muscle twitches.  Trembling or feeling shaky.  Being easily startled.  Feeling like your heart is pounding or racing.  Feeling out of breath or like you cannot take a deep breath.  Having trouble falling asleep or staying asleep.  Sweating.  Nausea, diarrhea, or irritable bowel syndrome (IBS).  Headaches.  Trouble concentrating or remembering facts.  Restlessness.  Irritability. How is this diagnosed? Your health care provider can diagnose GAD based on your symptoms and medical history. You will also have a physical exam. The health care provider will ask specific questions about your symptoms, including how severe they are, when they started, and if they come and go. Your health care  provider may ask you about your use of alcohol or drugs, including prescription medicines. Your health care provider may refer you to a mental health specialist for further evaluation. Your health care provider will do a thorough examination and may perform additional tests to rule out other possible causes of your symptoms. To be diagnosed with GAD, a person must have anxiety that:  Is out of his or her control.  Affects several different aspects of his or her life, such as work and relationships.  Causes distress that makes him or her unable to take part in normal activities.  Includes at least three physical symptoms of GAD, such as restlessness, fatigue, trouble concentrating, irritability, muscle tension, or sleep problems. Before your health care provider can confirm a diagnosis of GAD, these symptoms must be present more days than they are not, and they must last for six months or longer. How is this treated? The following therapies are usually used to treat GAD:  Medicine. Antidepressant medicine is usually prescribed for long-term daily control. Antianxiety medicines may be added in severe cases, especially when panic attacks occur.  Talk therapy (psychotherapy). Certain types of talk therapy can be helpful in treating GAD by providing support, education, and guidance. Options include: ? Cognitive behavioral therapy (CBT). People learn coping skills and techniques to ease their anxiety. They learn to identify unrealistic or negative thoughts and behaviors and to replace them with positive ones. ? Acceptance and commitment therapy (ACT). This treatment teaches people how to be mindful as a way to cope with unwanted thoughts and feelings. ? Biofeedback. This process trains you to manage your body's response (  physiological response) through breathing techniques and relaxation methods. You will work with a therapist while machines are used to monitor your physical symptoms.  Stress  management techniques. These include yoga, meditation, and exercise. A mental health specialist can help determine which treatment is best for you. Some people see improvement with one type of therapy. However, other people require a combination of therapies. Follow these instructions at home:  Take over-the-counter and prescription medicines only as told by your health care provider.  Try to maintain a normal routine.  Try to anticipate stressful situations and allow extra time to manage them.  Practice any stress management or self-calming techniques as taught by your health care provider.  Do not punish yourself for setbacks or for not making progress.  Try to recognize your accomplishments, even if they are small.  Keep all follow-up visits as told by your health care provider. This is important. Contact a health care provider if:  Your symptoms do not get better.  Your symptoms get worse.  You have signs of depression, such as: ? A persistently sad, cranky, or irritable mood. ? Loss of enjoyment in activities that used to bring you joy. ? Change in weight or eating. ? Changes in sleeping habits. ? Avoiding friends or family members. ? Loss of energy for normal tasks. ? Feelings of guilt or worthlessness. Get help right away if:  You have serious thoughts about hurting yourself or others. If you ever feel like you may hurt yourself or others, or have thoughts about taking your own life, get help right away. You can go to your nearest emergency department or call:  Your local emergency services (911 in the [place]  A suicide crisis helpline, such as the National Suicide Prevention Lifeline at (269)060-4268. This is open 24 hours a day. Summary  Generalized anxiety disorder (GAD) is a mental health disorder that involves worry that is not triggered by a specific event.  People with GAD often worry excessively about many things in their lives, such as their health and  family.  GAD may cause physical symptoms such as restlessness, trouble concentrating, sleep problems, frequent sweating, nausea, diarrhea, headaches, and trembling or muscle twitching.  A mental health specialist can help determine which treatment is best for you. Some people see improvement with one type of therapy. However, other people require a combination of therapies. This information is not intended to replace advice given to you by your health care provider. Make sure you discuss any questions you have with your health care provider. Document Revised: 04/28/2017 Document Reviewed: 04/05/2016 Elsevier Patient Education  2020 Elsevier Inc. Pregabalin capsules What is this medicine? PREGABALIN (pre GAB a lin) is used to treat nerve pain from diabetes, shingles, spinal cord injury, and fibromyalgia. It is also used to control seizures in epilepsy. This medicine may be used for other purposes; ask your health care provider or pharmacist if you have questions. COMMON BRAND NAME(S): Lyrica What should I tell my health care provider before I take this medicine? They need to know if you have any of these conditions:  heart disease  history of drug abuse or alcohol abuse problem  kidney disease  lung or breathing disease  suicidal thoughts, plans, or attempt; a previous suicide attempt by you or a family member  an unusual or allergic reaction to pregabalin, gabapentin, other medicines, foods, dyes, or preservatives  pregnant or trying to get pregnant  breast-feeding How should I use this medicine? Take this medicine by mouth  with a glass of water. Follow the directions on the prescription label. You can take it with or without food. If it upsets your stomach, take it with food. Take your medicine at regular intervals. Do not take it more often than directed. Do not stop taking except on your doctor's advice. A special MedGuide will be given to you by the pharmacist with each  prescription and refill. Be sure to read this information carefully each time. Talk to your pediatrician regarding the use of this medicine in children. While this drug may be prescribed for children as young as 1 month for selected conditions, precautions do apply. Overdosage: If you think you have taken too much of this medicine contact a poison control center or emergency room at once. NOTE: This medicine is only for you. Do not share this medicine with others. What if I miss a dose? If you miss a dose, take it as soon as you can. If it is almost time for your next dose, take only that dose. Do not take double or extra doses. What may interact with this medicine? This medicine may interact with the following medications:  alcohol  antihistamines for allergy, cough, and cold  certain medicines for anxiety or sleep  certain medicines for depression like amitriptyline, fluoxetine, sertraline  certain medicines for diabetes  certain medicines for seizures like phenobarbital, primidone  general anesthetics like halothane, isoflurane, methoxyflurane, propofol  local anesthetics like lidocaine, pramoxine, tetracaine  medicines that relax muscles for surgery  narcotic medicines for pain  phenothiazines like chlorpromazine, mesoridazine, prochlorperazine, thioridazine This list may not describe all possible interactions. Give your health care provider a list of all the medicines, herbs, non-prescription drugs, or dietary supplements you use. Also tell them if you smoke, drink alcohol, or use illegal drugs. Some items may interact with your medicine. What should I watch for while using this medicine? Tell your doctor or healthcare professional if your symptoms do not start to get better or if they get worse. Visit your doctor or health care professional for regular checks on your progress. Do not stop taking except on your doctor's advice. You may develop a severe reaction. Your doctor will  tell you how much medicine to take. Wear a medical identification bracelet or chain if you are taking this medicine for seizures, and carry a card that describes your disease and details of your medicine and dosage times. You may get drowsy or dizzy. Do not drive, use machinery, or do anything that needs mental alertness until you know how this medicine affects you. Do not stand or sit up quickly, especially if you are an older patient. This reduces the risk of dizzy or fainting spells. Alcohol may interfere with the effect of this medicine. Avoid alcoholic drinks. If you have a heart condition, like congestive heart failure, and notice that you are retaining water and have swelling in your hands or feet, contact your health care provider immediately. The use of this medicine may increase the chance of suicidal thoughts or actions. Pay special attention to how you are responding while on this medicine. Any worsening of mood, or thoughts of suicide or dying should be reported to your health care professional right away. This medicine has caused reduced sperm counts in some men. This may interfere with the ability to father a child. You should talk to your doctor or health care professional if you are concerned about your fertility. Women who become pregnant while using this medicine for seizures may  enroll in the [place] Pregnancy Registry by calling 9710838658. This registry collects information about the safety of antiepileptic drug use during pregnancy. What side effects may I notice from receiving this medicine? Side effects that you should report to your doctor or health care professional as soon as possible:  allergic reactions like skin rash, itching or hives, swelling of the face, lips, or tongue  breathing problems  changes in vision  chest pain  confusion  jerking or unusual movements of any part of your body  loss of memory  muscle pain, tenderness, or  weakness  suicidal thoughts or other mood changes  swelling of the ankles, feet, hands  unusual bruising or bleeding Side effects that usually do not require medical attention (report to your doctor or health care professional if they continue or are bothersome):  dizziness  drowsiness  dry mouth  headache  nausea  tremors  trouble sleeping  weight gain This list may not describe all possible side effects. Call your doctor for medical advice about side effects. You may report side effects to FDA at 1-800-FDA-1088. Where should I keep my medicine? Keep out of the reach of children. This medicine can be abused. Keep your medicine in a safe place to protect it from theft. Do not share this medicine with anyone. Selling or giving away this medicine is dangerous and against the law. This medicine may cause accidental overdose and death if it taken by other adults, children, or pets. Mix any unused medicine with a substance like cat litter or coffee grounds. Then throw the medicine away in a sealed container like a sealed bag or a coffee can with a lid. Do not use the medicine after the expiration date. Store at room temperature between 15 and 30 degrees C (59 and 86 degrees F). NOTE: This sheet is a summary. It may not cover all possible information. If you have questions about this medicine, talk to your doctor, pharmacist, or health care provider.  2020 Elsevier/Gold Standard (2018-05-18 13:15:55)

## 2019-11-09 ENCOUNTER — Other Ambulatory Visit: Payer: Self-pay | Admitting: Family Medicine

## 2019-11-09 ENCOUNTER — Encounter: Payer: Self-pay | Admitting: Family Medicine

## 2019-11-09 DIAGNOSIS — E785 Hyperlipidemia, unspecified: Secondary | ICD-10-CM

## 2019-11-09 LAB — CBC WITH DIFFERENTIAL/PLATELET
Basophils Absolute: 0.1 10*3/uL (ref 0.0–0.2)
Basos: 1 %
EOS (ABSOLUTE): 0.3 10*3/uL (ref 0.0–0.4)
Eos: 3 %
Hematocrit: 43.1 % (ref 34.0–46.6)
Hemoglobin: 14.3 g/dL (ref 11.1–15.9)
Immature Grans (Abs): 0 10*3/uL (ref 0.0–0.1)
Immature Granulocytes: 0 %
Lymphocytes Absolute: 3.5 10*3/uL — ABNORMAL HIGH (ref 0.7–3.1)
Lymphs: 32 %
MCH: 30.5 pg (ref 26.6–33.0)
MCHC: 33.2 g/dL (ref 31.5–35.7)
MCV: 92 fL (ref 79–97)
Monocytes Absolute: 1 10*3/uL — ABNORMAL HIGH (ref 0.1–0.9)
Monocytes: 9 %
Neutrophils Absolute: 6.1 10*3/uL (ref 1.4–7.0)
Neutrophils: 55 %
Platelets: 277 10*3/uL (ref 150–450)
RBC: 4.69 x10E6/uL (ref 3.77–5.28)
RDW: 13.3 % (ref 11.7–15.4)
WBC: 11 10*3/uL — ABNORMAL HIGH (ref 3.4–10.8)

## 2019-11-09 LAB — LIPID PANEL
Chol/HDL Ratio: 4.5 ratio — ABNORMAL HIGH (ref 0.0–4.4)
Cholesterol, Total: 254 mg/dL — ABNORMAL HIGH (ref 100–199)
HDL: 57 mg/dL (ref >39)
LDL Chol Calc (NIH): 163 mg/dL — ABNORMAL HIGH (ref 0–99)
Triglycerides: 186 mg/dL — ABNORMAL HIGH (ref 0–149)
VLDL Cholesterol Cal: 34 mg/dL (ref 5–40)

## 2019-11-09 LAB — COMPREHENSIVE METABOLIC PANEL
ALT: 29 IU/L (ref 0–32)
AST: 31 IU/L (ref 0–40)
Albumin/Globulin Ratio: 1.4 (ref 1.2–2.2)
Albumin: 4.5 g/dL (ref 3.8–4.8)
Alkaline Phosphatase: 120 IU/L (ref 48–121)
BUN/Creatinine Ratio: 9 (ref 9–23)
BUN: 9 mg/dL (ref 6–24)
Bilirubin Total: 0.5 mg/dL (ref 0.0–1.2)
CO2: 21 mmol/L (ref 20–29)
Calcium: 10.2 mg/dL (ref 8.7–10.2)
Chloride: 104 mmol/L (ref 96–106)
Creatinine, Ser: 1 mg/dL (ref 0.57–1.00)
GFR calc Af Amer: 76 mL/min/{1.73_m2} (ref >59)
GFR calc non Af Amer: 66 mL/min/{1.73_m2} (ref >59)
Globulin, Total: 3.2 g/dL (ref 1.5–4.5)
Glucose: 126 mg/dL — ABNORMAL HIGH (ref 65–99)
Potassium: 3.5 mmol/L (ref 3.5–5.2)
Sodium: 141 mmol/L (ref 134–144)
Total Protein: 7.7 g/dL (ref 6.0–8.5)

## 2019-11-09 LAB — VITAMIN D 25 HYDROXY (VIT D DEFICIENCY, FRACTURES): Vit D, 25-Hydroxy: 40.4 ng/mL (ref 30.0–100.0)

## 2019-11-09 LAB — VITAMIN B12: Vitamin B-12: 1634 pg/mL — ABNORMAL HIGH (ref 232–1245)

## 2019-11-09 LAB — TSH: TSH: 1.05 u[IU]/mL (ref 0.450–4.500)

## 2019-11-09 MED ORDER — ATORVASTATIN CALCIUM 10 MG PO TABS: 10.0000 mg | ORAL_TABLET | Freq: Every day | ORAL | 3 refills | Status: DC

## 2019-11-10 ENCOUNTER — Encounter: Payer: Self-pay | Admitting: Family Medicine

## 2019-11-10 DIAGNOSIS — R232 Flushing: Secondary | ICD-10-CM | POA: Insufficient documentation

## 2019-11-10 DIAGNOSIS — G4700 Insomnia, unspecified: Secondary | ICD-10-CM | POA: Insufficient documentation

## 2019-11-12 NOTE — Progress Notes (Signed)
Patient notified of results, verbally understood no additional questions.

## 2019-11-15 ENCOUNTER — Telehealth: Payer: Self-pay | Admitting: Clinical

## 2019-11-15 NOTE — Telephone Encounter (Addendum)
Integrated Behavioral Health Case Management Referral Note  11/15/2019 Name: Theresa Olson MRN: [id number] DOB: Jan 06, 1971 Theresa Olson is a 49 y.o. year old female who sees Kallie Locks, FNP for primary care. LCSW was consulted to assess patient's needs and assist the patient with Mental Health Counseling and Resources.  Interpreter: No.   Interpreter Name & Language: none  Assessment: Patient experiencing anxiety. Patient is scheduled for follow up with [facility] in August. CSW received referral to assess and provide support in the interim. Called patient today and scheduled assessment appointment.   Review of patient status, including review of consultants reports, relevant laboratory and other test results, and collaboration with appropriate care team members and the patient's provider was performed as part of comprehensive patient evaluation and provision of services.    SDOH (Social Determinants of Health) assessments performed: No     Outpatient Encounter Medications as of 11/15/2019  Medication Sig Note  . ALPRAZolam (XANAX) 1 MG tablet Take 1 tablet (1 mg total) by mouth at bedtime as needed for anxiety.   Marland Kitchen atorvastatin (LIPITOR) 10 MG tablet Take 1 tablet (10 mg total) by mouth daily.   . busPIRone (BUSPAR) 10 MG tablet Take 1 tablet (10 mg total) by mouth 3 (three) times daily.   Marland Kitchen gabapentin (NEURONTIN) 300 MG capsule Take 1 capsule (300 mg total) by mouth 3 (three) times daily.   Marland Kitchen GLUTATHIONE PO Take 1 tablet by mouth daily. 01/28/2019: As needed.   . hydrOXYzine (ATARAX/VISTARIL) 10 MG tablet Take 1 tablet (10 mg total) by mouth 3 (three) times daily as needed.   . Methylsulfonylmethane (MSM) 1000 MG CAPS Take 1,000 mg by mouth daily.   Marland Kitchen PARoxetine (PAXIL) 40 MG tablet TAKE 1 TABLET BY MOUTH EVERY DAY IN THE MORNING   . pregabalin (LYRICA) 50 MG capsule Take 1 capsule (50 mg total) by mouth 2 (two) times daily.   . vitamin C (ASCORBIC ACID) 500 MG tablet  Take 500 mg by mouth every morning.   . Vitamin D, Ergocalciferol, (DRISDOL) 1.25 MG (50000 UT) CAPS capsule Take 1 capsule (50,000 Units total) by mouth every 7 (seven) days.    No facility-administered encounter medications on file as of 11/15/2019.    Goals Addressed   None      Follow up Plan: 1. Assessment 11/19/19  Abigail Butts, LCSW [facility] [phone]

## 2019-11-19 ENCOUNTER — Other Ambulatory Visit: Payer: Self-pay

## 2019-11-19 ENCOUNTER — Ambulatory Visit (INDEPENDENT_AMBULATORY_CARE_PROVIDER_SITE_OTHER): Payer: Medicaid Other | Admitting: Clinical

## 2019-11-19 DIAGNOSIS — F332 Major depressive disorder, recurrent severe without psychotic features: Secondary | ICD-10-CM

## 2019-11-19 DIAGNOSIS — F419 Anxiety disorder, unspecified: Secondary | ICD-10-CM
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
## 2019-11-28 ENCOUNTER — Other Ambulatory Visit: Payer: Self-pay

## 2019-11-28 ENCOUNTER — Ambulatory Visit (INDEPENDENT_AMBULATORY_CARE_PROVIDER_SITE_OTHER): Payer: Medicaid Other | Admitting: Clinical

## 2019-11-28 DIAGNOSIS — F419 Anxiety disorder, unspecified: Secondary | ICD-10-CM | POA: Diagnosis not present

## 2019-11-28 DIAGNOSIS — F332 Major depressive disorder, recurrent severe without psychotic features: Secondary | ICD-10-CM

## 2019-11-28 NOTE — BH Specialist Note (Addendum)
Integrated Behavioral Health Follow Up Visit  MRN: [id number] Name: Theresa Olson  Number of Integrated Behavioral Health Clinician visits: 1/6 Session Start time: 11:15  Session End time: 12:15 Total time: 60  Type of Service: Integrated Behavioral Health- Individual/Family Interpretor:No. Interpretor Name and Language: none  SUBJECTIVE: Theresa Olson is a 49 y.o. female accompanied by self Patient was referred by Theresa Ip, FNP for depression, anxiety. Patient reports the following symptoms/concerns: depression, anxiety Duration of problem: approx 30 years ; Severity of problem: severe  OBJECTIVE: Mood: Depressed and Affect: Appropriate and Tearful Risk of harm to self or others: Self-harm thoughts No plan to harm self or others - see assessment below  LIFE CONTEXT: Family and Social: adult children live in [place] School/Work: not employed Self-Care: following Clinical research associate Life Changes: one of her adult sons recently moved in with her  GOALS ADDRESSED: Patient will: 1.  Reduce symptoms of: anxiety and depression  2.  Increase knowledge and/or ability of: coping skills and self-management skills  3.  Demonstrate ability to: Increase healthy adjustment to current life circumstances and Improve medication compliance  INTERVENTIONS: Interventions utilized:  Mindfulness or Relaxation Training and CBT Standardized Assessments completed: Not Needed  ASSESSMENT: Patient currently experiencing severe depression and anxiety. Recently these problems also exacerbated by her son coming to live with her. Today CBT strategies to explore unhelpful thoughts and behaviors. Assertive Armed forces logistics/support/administrative officer.   Patient distressed by some unhelpful thoughts. In assessment reported past thoughts of self harm/suicide with no attempts. Patient reports religious beliefs that would prevent her from doing this. Protective factors  include a sense of religious and spiritual purpose. Patient denied thoughts of suicide or self harm today. CSW and patient created safety plan which included contact numbers for this CSW, [facility] [phone]), and Suicide Prevention Lifeline (605) 702-8799 or text 743-856-6526).  Patient may benefit from continued IBH services with CBT approach to identify and challenge unhelpful thoughts and behaviors and develop coping skills. Will continue to assess for SI and support as needed.  PLAN: 1. Follow up with behavioral health clinician on: 12/05/19 2. Behavioral recommendations: mood journal 3. Referral(s): Integrated [employer] (In Clinic)  Theresa Butts, LCSW  [facility] [phone]

## 2019-12-05 ENCOUNTER — Ambulatory Visit: Payer: Self-pay | Admitting: Clinical

## 2019-12-09 ENCOUNTER — Other Ambulatory Visit: Payer: Self-pay

## 2019-12-09 ENCOUNTER — Encounter: Payer: Self-pay | Admitting: Family Medicine

## 2019-12-09 ENCOUNTER — Ambulatory Visit (INDEPENDENT_AMBULATORY_CARE_PROVIDER_SITE_OTHER): Payer: Medicaid Other | Admitting: Family Medicine

## 2019-12-09 VITALS — BP 150/108 | HR 86 | Temp 98.2°F | Resp 18 | Ht 60.0 in | Wt 185.8 lb

## 2019-12-09 DIAGNOSIS — F419 Anxiety disorder, unspecified: Secondary | ICD-10-CM | POA: Diagnosis not present

## 2019-12-09 DIAGNOSIS — R232 Flushing: Secondary | ICD-10-CM

## 2019-12-09 DIAGNOSIS — G5601 Carpal tunnel syndrome, right upper limb: Secondary | ICD-10-CM

## 2019-12-09 DIAGNOSIS — Z09 Encounter for follow-up examination after completed treatment for conditions other than malignant neoplasm: Secondary | ICD-10-CM | POA: Diagnosis not present

## 2019-12-09 DIAGNOSIS — G4700 Insomnia, unspecified: Secondary | ICD-10-CM

## 2019-12-09 DIAGNOSIS — I160 Hypertensive urgency: Secondary | ICD-10-CM | POA: Diagnosis not present

## 2019-12-09 MED ORDER — AMLODIPINE BESYLATE 5 MG PO TABS: 5.0000 mg | ORAL_TABLET | Freq: Every day | ORAL | 3 refills | Status: DC

## 2019-12-09 MED ORDER — CLONIDINE HCL 0.2 MG PO TABS
0.2000 mg | ORAL_TABLET | Freq: Once | ORAL | Status: AC
  Administered 2019-12-09: 0.2 mg via ORAL

## 2019-12-09 NOTE — Progress Notes (Signed)
[facility]    Established Patient Office Visit  Subjective:  Patient ID: Theresa Olson, female    DOB: 03/23/1971  Age: 49 y.o. MRN: [id number]  CC:  Chief Complaint  Patient presents with  . Follow-up    Pt states she is here for her f/u today. Pt states she would like to talk about her medicines.    HPI Theresa Olson is a [age] year female who presents for Follow Up today.    Patient Active Problem List   Diagnosis Date Noted  . Insomnia 11/10/2019  . Hot flashes 11/10/2019  . Anxiety attack 01/28/2019  . Carpal tunnel syndrome on right 01/28/2019  . Amenorrhea 11/17/2016  . Frequent No-show for appointment 08/11/2016  . Recurrent major depression-severe (HCC) 11/02/2011  . TOBACCO USER 03/07/2009  . ANXIETY DISORDER, GENERALIZED 08/20/2008    Current Status: Since her last office visit, she is doing well with no complaints. She states that she is feeling much better since restarting her anxiety medications as prescribed. She denies fevers, chills, fatigue, recent infections, weight loss, and night sweats. She has not had any headaches, visual changes, dizziness, and falls. No chest pain, heart palpitations, cough and shortness of breath reported. Denies GI problems such as nausea, vomiting, diarrhea, and constipation. She has no reports of blood in stools, dysuria and hematuria. her anxiety is moderate today r/t family and home stressors. She denies suicidal ideations, homicidal ideations, or auditory hallucinations. She is taking all medications as prescribed. She denies pain today.   Past Medical History:  Diagnosis Date  . Anxiety   . Anxiety disorder   . Carpal tunnel syndrome   . Depression   . Hot flashes   . Hyperlipidemia 10/2019  . Hypertension   . Insomnia   . Vitamin D deficiency 12/2018    Past Surgical History:  Procedure Laterality Date  . CESAREAN SECTION      Family History  Problem Relation Age  of Onset  . Hyperlipidemia Mother   . Diabetes Mother   . Hyperlipidemia Father   . Diabetes Father   . Heart disease Father     Social History   Socioeconomic History  . Marital status: Single    Spouse name: Not on file  . Number of children: Not on file  . Years of education: Not on file  . Highest education level: Not on file  Occupational History  . Not on file  Tobacco Use  . Smoking status: Former Smoker    Types: Cigarettes    Quit date: 08/28/2016    Years since quitting: 3.2  . Smokeless tobacco: Never Used  Substance and Sexual Activity  . Alcohol use: Yes    Comment: 2-3 glasses of vodka per day  . Drug use: Yes    Types: Marijuana    Comment: smokes every few days  . Sexual activity: Yes    Birth control/protection: None  Other Topics Concern  . Not on file  Social History Narrative  . Not on file   Social Determinants of Health   Financial Resource Strain:   . Difficulty of Paying Living Expenses:   Food Insecurity:   . Worried About Programme researcher, broadcasting/film/videounning Out of Food in the Last Year:   . Baristaan Out of Food in the Last Year:   Transportation Needs:   . Freight forwarderLack of Transportation (Medical):   Marland Kitchen. Lack of Transportation (Non-Medical):   Physical Activity:   . Days of Exercise per  Week:   . Minutes of Exercise per Session:   Stress:   . Feeling of Stress :   Social Connections:   . Frequency of Communication with Friends and Family:   . Frequency of Social Gatherings with Friends and Family:   . Attends Religious Services:   . Active Member of Clubs or Organizations:   . Attends Banker Meetings:   Marland Kitchen Marital Status:   Intimate Partner Violence:   . Fear of Current or Ex-Partner:   . Emotionally Abused:   Marland Kitchen Physically Abused:   . Sexually Abused:     Outpatient Medications Prior to Visit  Medication Sig Dispense Refill  . atorvastatin (LIPITOR) 10 MG tablet Take 1 tablet (10 mg total) by mouth daily. 90 tablet 3  . busPIRone (BUSPAR) 10 MG tablet  Take 1 tablet (10 mg total) by mouth 3 (three) times daily. 90 tablet 6  . gabapentin (NEURONTIN) 300 MG capsule Take 1 capsule (300 mg total) by mouth 3 (three) times daily. 90 capsule 6  . GLUTATHIONE PO Take 1 tablet by mouth daily.    . hydrOXYzine (ATARAX/VISTARIL) 10 MG tablet Take 1 tablet (10 mg total) by mouth 3 (three) times daily as needed. 90 tablet 6  . PARoxetine (PAXIL) 40 MG tablet TAKE 1 TABLET BY MOUTH EVERY DAY IN THE MORNING 30 tablet 6  . pregabalin (LYRICA) 50 MG capsule Take 1 capsule (50 mg total) by mouth 2 (two) times daily. 60 capsule 6  . vitamin C (ASCORBIC ACID) 500 MG tablet Take 500 mg by mouth every morning.    Marland Kitchen ALPRAZolam (XANAX) 1 MG tablet Take 1 tablet (1 mg total) by mouth at bedtime as needed for anxiety. (Patient not taking: Reported on 12/09/2019) 30 tablet 0  . Methylsulfonylmethane (MSM) 1000 MG CAPS Take 1,000 mg by mouth daily. (Patient not taking: Reported on 12/09/2019)    . Vitamin D, Ergocalciferol, (DRISDOL) 1.25 MG (50000 UT) CAPS capsule Take 1 capsule (50,000 Units total) by mouth every 7 (seven) days. (Patient not taking: Reported on 12/09/2019) 5 capsule 6   No facility-administered medications prior to visit.    Allergies  Allergen Reactions  . Zoloft [Sertraline Hcl] Swelling and Hypertension    ROS Review of Systems  Constitutional: Negative.   HENT: Negative.   Eyes: Negative.   Respiratory: Negative.   Cardiovascular: Negative.   Gastrointestinal: Positive for abdominal distention.  Endocrine: Negative.   Genitourinary: Negative.   Musculoskeletal: Negative.   Skin: Negative.   Allergic/Immunologic: Negative.   Neurological: Positive for dizziness (occasional) and headaches (occasional ).  Hematological: Negative.   Psychiatric/Behavioral: The patient is nervous/anxious.       Objective:    Physical Exam Vitals and nursing note reviewed.  Constitutional:      Appearance: Normal appearance.  HENT:     Head:  Normocephalic and atraumatic.     Nose: Nose normal.     Mouth/Throat:     Mouth: Mucous membranes are dry.  Cardiovascular:     Rate and Rhythm: Normal rate and regular rhythm.     Pulses: Normal pulses.     Heart sounds: Normal heart sounds.  Pulmonary:     Effort: Pulmonary effort is normal.     Breath sounds: Normal breath sounds.  Abdominal:     General: Bowel sounds are normal. There is distension (obese).     Palpations: Abdomen is soft.  Musculoskeletal:        General: Normal range of motion.  Cervical back: Normal range of motion and neck supple.  Skin:    General: Skin is warm and dry.  Neurological:     General: No focal deficit present.     Mental Status: She is alert and oriented to person, place, and time.  Psychiatric:        Thought Content: Thought content normal.        Judgment: Judgment normal.     Comments: Anxious and tearful today.      BP (!) 150/108   Pulse 86   Temp 98.2 F (36.8 C)   Resp 18   Ht 5' (1.524 m)   Wt 185 lb 12.8 oz (84.3 kg)   LMP 04/18/2017   SpO2 99%   BMI 36.29 kg/m  Wt Readings from Last 3 Encounters:  12/09/19 185 lb 12.8 oz (84.3 kg)  11/08/19 185 lb (83.9 kg)  03/16/19 165 lb (74.8 kg)     Health Maintenance Due  Topic Date Due  . COVID-19 Vaccine (1) Never done  . PAP SMEAR-Modifier  06/04/2016  . MAMMOGRAM  Never done  . COLONOSCOPY  Never done    There are no preventive care reminders to display for this patient.  Lab Results  Component Value Date   TSH 1.050 11/08/2019   Lab Results  Component Value Date   WBC 11.0 (H) 11/08/2019   HGB 14.3 11/08/2019   HCT 43.1 11/08/2019   MCV 92 11/08/2019   PLT 277 11/08/2019   Lab Results  Component Value Date   NA 141 11/08/2019   K 3.5 11/08/2019   CO2 21 11/08/2019   GLUCOSE 126 (H) 11/08/2019   BUN 9 11/08/2019   CREATININE 1.00 11/08/2019   BILITOT 0.5 11/08/2019   ALKPHOS 120 11/08/2019   AST 31 11/08/2019   ALT 29 11/08/2019   PROT 7.7  11/08/2019   ALBUMIN 4.5 11/08/2019   CALCIUM 10.2 11/08/2019   ANIONGAP 8 12/18/2016   Lab Results  Component Value Date   CHOL 254 (H) 11/08/2019   Lab Results  Component Value Date   HDL 57 11/08/2019   Lab Results  Component Value Date   LDLCALC 163 (H) 11/08/2019   Lab Results  Component Value Date   TRIG 186 (H) 11/08/2019   Lab Results  Component Value Date   CHOLHDL 4.5 (H) 11/08/2019   Lab Results  Component Value Date   HGBA1C 5.7 (A) 01/05/2018      Assessment & Plan:   1. Hypertensive urgency Blood pressures are elevated today. Clonidine 0.3 mg given to patient in office and blood pressures remain elevated. We referred him to ED via ambulance at this time. Patient refused and signed AMA form at discharge. She denies severe headaches, confusion, seizures, double vision, and blurred vision, nausea and vomiting. She will report to ED if she experiences these symptoms. Patient verbalized understanding.   - cloNIDine (CATAPRES) tablet 0.3 mg - amLODipine (NORVASC) 5 MG tablet; Take 1 tablet (5 mg total) by mouth daily.  Dispense: 90 tablet; Refill: 3  2. Anxiety Stable today. She will have follow up with Abigail Butts, LCSW today.   3. Insomnia, unspecified type  4. Carpal tunnel syndrome on right  5. Hot flashes Stable. Not worsening.   6. Follow up She will follow up in 1 month.     Meds ordered this encounter  Medications  . cloNIDine (CATAPRES) tablet 0.2 mg  . amLODipine (NORVASC) 5 MG tablet    Sig: Take 1 tablet (  5 mg total) by mouth daily.    Dispense:  90 tablet    Refill:  3   No orders of the defined types were placed in this encounter.   Referral Orders  No referral(s) requested today    Raliegh Ip,  MSN, FNP-BC [facility] [address] [place], [place] [address] [phone] fax  Problem List Items Addressed This Visit       Cardiovascular and Mediastinum   Hot flashes   Relevant Medications   cloNIDine (CATAPRES) tablet 0.2 mg   amLODipine (NORVASC) 5 MG tablet     Nervous and Auditory   Carpal tunnel syndrome on right     Other   Insomnia    Other Visit Diagnoses    Hypertensive urgency    -  Primary   Relevant Medications   cloNIDine (CATAPRES) tablet 0.2 mg   amLODipine (NORVASC) 5 MG tablet   Anxiety       Follow up          Meds ordered this encounter  Medications  . cloNIDine (CATAPRES) tablet 0.2 mg  . amLODipine (NORVASC) 5 MG tablet    Sig: Take 1 tablet (5 mg total) by mouth daily.    Dispense:  90 tablet    Refill:  3    Follow-up: No follow-ups on file.    Kallie Locks, FNP

## 2019-12-12 ENCOUNTER — Ambulatory Visit: Payer: Self-pay | Admitting: Clinical

## 2019-12-16 ENCOUNTER — Other Ambulatory Visit: Payer: Self-pay

## 2019-12-16 ENCOUNTER — Ambulatory Visit (INDEPENDENT_AMBULATORY_CARE_PROVIDER_SITE_OTHER): Payer: Medicaid Other | Admitting: Clinical

## 2019-12-16 DIAGNOSIS — F419 Anxiety disorder, unspecified: Secondary | ICD-10-CM | POA: Diagnosis not present

## 2019-12-16 NOTE — BH Specialist Note (Addendum)
Integrated Behavioral Health Follow Up Visit  MRN: [id number] Name: Theresa Olson  Number of Integrated Behavioral Health Clinician visits: 2/6 Session Start time: 11:05  Session End time: 12:05 Total time: 60  Type of Service: Integrated Behavioral Health- Individual/Family Interpretor:No. Interpretor Name and Language: none  SUBJECTIVE: Theresa Olson is a 49 y.o. female accompanied by self Patient was referred by Theresa Ip, NP for depression, anxiety. Patient reports the following symptoms/concerns: depression, anxiety Duration of problem: approx 30 years; Severity of problem: severe  OBJECTIVE: Mood: Euthymic and Affect: Appropriate and Tearful Risk of harm to self or others: No plan to harm self or others  LIFE CONTEXT: Family and Social: adult children live in [place] School/Work: not employed Self-Care: following Clinical research associate Life Changes: one of her adult sons recently moved in with her  GOALS ADDRESSED: Patient will: 1.  Reduce symptoms of: anxiety and depression  2.  Increase knowledge and/or ability of: coping skills and self-management skills  3.  Demonstrate ability to: Increase healthy adjustment to current life circumstances and Improve medication compliance  INTERVENTIONS: Interventions utilized:  Solution-Focused Strategies, Mindfulness or Relaxation Training and Supportive Counseling Standardized Assessments completed: Not Needed  ASSESSMENT: Patient currently experiencing severe depression and anxiety. Recently these problems also exacerbated by her son coming to live with her. Confirmed patient's first appointment at [facility] outpatient behavioral health [facility]). Provided supportive counseling around starting care at [facility]. Counseling around alternate healthier reactions to avoid aggressive communication and also practiced mindfulness strategy.   Patient is making progress towards goal.  States she uses mindfulness strategies practiced  at last session. This is also evident by engagement in trying new communication and coping strategies.   Patient may benefit from continued IBH services with CBT approach to identify and challenge unhelpful thoughts and behaviors and develop coping skills. Will continue to assess for SI and support as needed.  PLAN: 1. Follow up with behavioral health clinician on: 12/26/19 2. Behavioral recommendations: mood journal 3. Referral(s): Integrated Behavioral Health Services (In Clinic) 4. "From scale of 1-10, how likely are you to follow plan?":   Theresa Butts, LCSW  [facility] [phone]

## 2019-12-18 ENCOUNTER — Other Ambulatory Visit: Payer: Self-pay | Admitting: Family Medicine

## 2019-12-18 DIAGNOSIS — E559 Vitamin D deficiency, unspecified: Secondary | ICD-10-CM

## 2019-12-26 ENCOUNTER — Ambulatory Visit: Payer: Self-pay | Admitting: Clinical

## 2020-01-06 ENCOUNTER — Ambulatory Visit: Payer: Self-pay | Admitting: Clinical

## 2020-01-07 ENCOUNTER — Other Ambulatory Visit: Payer: Self-pay

## 2020-01-07 ENCOUNTER — Ambulatory Visit (INDEPENDENT_AMBULATORY_CARE_PROVIDER_SITE_OTHER): Payer: Medicaid Other | Admitting: Clinical

## 2020-01-07 ENCOUNTER — Other Ambulatory Visit: Payer: Self-pay | Admitting: Family Medicine

## 2020-01-07 DIAGNOSIS — F419 Anxiety disorder, unspecified: Secondary | ICD-10-CM | POA: Diagnosis not present

## 2020-01-07 DIAGNOSIS — F411 Generalized anxiety disorder: Secondary | ICD-10-CM

## 2020-01-07 NOTE — BH Specialist Note (Addendum)
Integrated Behavioral Health Visit via Telemedicine (Telephone)  01/07/2020 [name] [id number]  Number of Integrated Behavioral Health Clinician visits: 3/6 Session Start time: 2:15  Session End time: 3:15 Total time: 60  Referring Provider: Raliegh Ip, NP Type of Visit: Telephonic Patient location: [address] [facility] Provider location: [facility] All persons participating in visit: patient and CSW  Confirmed patient's address: Yes Confirmed patient's phone number: Yes  Any changes to demographics: No   Confirmed patient's insurance: Yes  Any changes to patient's insurance: No    The following statements were read to the patient and/or legal guardian that are established with the [facility] Provider.  "The purpose of this phone visit is to provide behavioral health care while limiting exposure to the coronavirus (COVID19).  There is a possibility of technology failure and discussed alternative modes of communication if that failure occurs."  Discussed confidentiality: Yes   "By engaging in this telephone visit, you consent to the provision of healthcare.  Additionally, you authorize for your insurance to be billed for the services provided during this telephone visit."   Patient and/or legal guardian consented to telephone visit: Yes   PRESENTING CONCERNS: Patient and/or family reports the following symptoms/concerns: depression, anxiety Duration of problem: approx 30 years; Severity of problem: severe  GOALS ADDRESSED: Patient will: 1.  Reduce symptoms of: anxiety and depression  2.  Increase knowledge and/or ability of: coping skills and self-management skills  3.  Demonstrate ability to: Increase healthy adjustment to current life circumstances and Improve medication compliance  INTERVENTIONS: Interventions utilized:  Supportive Counseling, Psychoeducation and/or Health Education and CBT Standardized Assessments completed: Not  Needed  ASSESSMENT: Patient presented at the office for scheduled appointment but reported a possible exposure to COVID. CSW briefly reviewed patient's intake paperwork for upcoming psychiatry appointment at [facility] behavioral health and will assist in sending it to [facility]. Remainder of session was held virtually via telephone to reduce the risk of COVID exposure.  Patient currently experiencing severe depression and anxiety. Education on psychiatry follow up today. Explored patient's fears about engaging with new provider. CBT today to explore patient's recent unhelpful thoughts and behaviors. Challenged unhelpful thought. Patient to practice cognitive rehearsal before next session.  Patient is making progress towards goal.  States that she uses the breathing techniques practiced in previous session. This is also evident by patient exploring unhelpful thoughts and behaviors in session.   Patient may benefit from continued IBH services with CBT approach to identify and challenge unhelpful thoughts and behaviors and develop coping skills.Will continue to assess for SI and support as needed.  PLAN: 1. Follow up with behavioral health clinician on: 01/13/20 2. Behavioral recommendations: cognitive rehearsal 3. Referral(s): Integrated [employer] (In Clinic)  Abigail Butts, LCSW [facility] [phone]

## 2020-01-09 ENCOUNTER — Other Ambulatory Visit: Payer: Self-pay | Admitting: Family Medicine

## 2020-01-09 DIAGNOSIS — F411 Generalized anxiety disorder: Secondary | ICD-10-CM

## 2020-01-10 ENCOUNTER — Ambulatory Visit: Payer: Self-pay | Admitting: Family Medicine

## 2020-01-13 ENCOUNTER — Ambulatory Visit: Payer: Medicaid Other | Admitting: Clinical

## 2020-01-14 ENCOUNTER — Ambulatory Visit (INDEPENDENT_AMBULATORY_CARE_PROVIDER_SITE_OTHER): Payer: Medicaid Other | Admitting: Clinical

## 2020-01-14 ENCOUNTER — Other Ambulatory Visit: Payer: Self-pay

## 2020-01-14 DIAGNOSIS — F419 Anxiety disorder, unspecified: Secondary | ICD-10-CM

## 2020-01-14 DIAGNOSIS — F332 Major depressive disorder, recurrent severe without psychotic features: Secondary | ICD-10-CM | POA: Diagnosis not present

## 2020-01-14 NOTE — BH Specialist Note (Addendum)
Integrated Behavioral Health Visit via Telemedicine (Telephone)  01/14/2020 [name] [id number]  Session #:  Session Start time: 11:10  Session End time: 12:10 Total time: 60 minutes  Referring Provider: Raliegh Ip, NP Type of Visit: Telephonic Patient location: [address] [facility] Provider location: [facility] Center All persons participating in visit: patient, CSW  Confirmed patient's address: Yes  Confirmed patient's phone number: Yes  Any changes to demographics: No   Confirmed patient's insurance: Yes  Any changes to patient's insurance: No    The following statements were read to the patient and/or legal guardian that are established with the [facility] Provider.  "The purpose of this phone visit is to provide behavioral health care while limiting exposure to the coronavirus (COVID19).  There is a possibility of technology failure and discussed alternative modes of communication if that failure occurs."  Discussed confidentiality: Yes   "By engaging in this telephone visit, you consent to the provision of healthcare.  Additionally, you authorize for your insurance to be billed for the services provided during this telephone visit."   Patient and/or legal guardian consented to telephone visit: Yes   PRESENTING CONCERNS: Patient and/or family reports the following symptoms/concerns: depression, anxiety Duration of problem: approx 30 years;  Severity of problem: severe  GOALS ADDRESSED: Patient will: 1.  Reduce symptoms of: anxiety and depression  2.  Increase knowledge and/or ability of: coping skills and self-management skills  3.  Demonstrate ability to: Increase healthy adjustment to current life circumstances and Improve medication compliance  INTERVENTIONS: Interventions utilized:  Supportive Counseling and CBT Standardized Assessments completed: Not Needed  ASSESSMENT: Patient currently experiencing severe depression and anxiety.  Patient had psychiatry appointment scheduled for 01/16/20 but needed to reschedule. CSW and [facility] on conference call and rescheduled the appointment.   CBT today to explore patient's recent unhelpful thoughts and behaviors. Reviewed patient's recent successes with using new positive coping skills such as breathing exercises.  Patient is making progress towards goal.  States that she uses healthy coping strategies learned in previous sessions and it has helped de-escalate her negative emotions and de-escalate conflicts between her and her son.   Patient may benefit from continued IBH services with CBT approach to identify and challenge unhelpful thoughts and behaviors and develop coping skills.  PLAN: 1. Follow up with behavioral health clinician on: 01/23/20 2. Behavioral recommendations:  3. Referral(s): Integrated [employer] (In Clinic)  Abigail Butts, LCSW [facility] [phone]

## 2020-01-16 ENCOUNTER — Ambulatory Visit (HOSPITAL_COMMUNITY): Payer: Medicaid Other | Admitting: Psychiatry

## 2020-01-16 ENCOUNTER — Telehealth: Payer: Self-pay | Admitting: Clinical

## 2020-01-16 NOTE — Telephone Encounter (Addendum)
Integrated Behavioral Health General Follow Up Note  01/16/2020 Name: Amarisa Wilinski MRN: [id number] DOB: 05/26/1971 Kristelle Cavallaro is a 49 y.o. year old female who sees Kallie Locks, FNP for primary care. LCSW was initially consulted to assess patient's needs and assist the patient with Mental Health Counseling and Resources.  Interpreter: No.   Interpreter Name & Language: none  Ongoing Intervention: Patient experiencing anxiety and depression. Today patient called CSW unscheduled and requested support. Patient having anxiety and negative thoughts about self related to making a decision about helping both her son and daughter with their current needs. Brief supportive counseling for about 10 minutes and review of CBT strategies from previous sessions of challenging negative thoughts. Validation of patient's own preferences and feelings related to the decision. CSW will continue to follow outpatient for behavioral health support.   Review of patient status, including review of consultants reports, relevant laboratory and other test results, and collaboration with appropriate care team members and the patient's provider was performed as part of comprehensive patient evaluation and provision of services.     Follow up Plan: 1. Will continue to follow outpatient. Next IBH appointment 01/23/20.   Abigail Butts, LCSW [facility] [phone]

## 2020-01-23 ENCOUNTER — Ambulatory Visit (INDEPENDENT_AMBULATORY_CARE_PROVIDER_SITE_OTHER): Payer: Medicaid Other | Admitting: Clinical

## 2020-01-23 DIAGNOSIS — F332 Major depressive disorder, recurrent severe without psychotic features: Secondary | ICD-10-CM | POA: Diagnosis not present

## 2020-01-23 DIAGNOSIS — F419 Anxiety disorder, unspecified: Secondary | ICD-10-CM | POA: Diagnosis not present

## 2020-01-23 NOTE — BH Specialist Note (Addendum)
Integrated Behavioral Health Follow Up Visit  MRN: [id number] Name: Theresa Olson  Number of Integrated Behavioral Health Clinician visits: 5/6 Session Start time: 11:25  Session End time: 12:25 Total time: 60  Type of Service: Integrated Behavioral Health- Individual/Family Interpretor:No. Interpretor Name and Language: none  SUBJECTIVE: Theresa Olson is a 49 y.o. female accompanied by self Patient was referred by Raliegh Ip, NP for depression, anxiety. Patient reports the following symptoms/concerns: depression, anxiety Duration of problem: approx 30 years; Severity of problem: severe  OBJECTIVE: Mood: Euthymic and Affect: Appropriate and Tearful Risk of harm to self or others: No plan to harm self or others  LIFE CONTEXT: Family and Social:adult children live in [place] School/Work:not employed Self-Care:following inspirational/religious leaders Life Changes:one of her adult sons recently moved in with her  GOALS ADDRESSED: Patient will: 1.  Reduce symptoms of: anxiety and depression  2.  Increase knowledge and/or ability of: coping skills and self-management skills  3.  Demonstrate ability to: Increase healthy adjustment to current life circumstances  INTERVENTIONS: Interventions utilized:  Mindfulness or Relaxation Training, Medication Monitoring and CBT Standardized Assessments completed: Not Needed  ASSESSMENT: Patient currently experiencing severe depression and anxiety. CBT today to continue exploring and challenging unhelpful thoughts, today related to patient's view of herself as a parent. Reviewed cognitive rehearsal homework and assigned again. Mindfulness exercise used in session when patient became tearful and anxiety level increased.  Also psycho-education today on medication adherence and strategies for remembering to take medications as prescribed. Patient brought a pill organizer to session and CSW provided education and guidance on organizing  her medications into the pill box.  Patient may benefit from continued IBH services with CBT approach to identify and challenge unhelpful thoughts and behaviors and develop coping skills.  PLAN: 1. Follow up with behavioral health clinician on: 01/30/20 2. Behavioral recommendations: cognitve rehearsal homework, follow up with psychiatry in two weeks 3. Referral(s): Integrated [employer] (In Clinic)  Abigail Butts, LCSW  [facility] [phone]

## 2020-01-30 ENCOUNTER — Ambulatory Visit: Payer: Self-pay | Admitting: Clinical

## 2020-02-06 ENCOUNTER — Ambulatory Visit (INDEPENDENT_AMBULATORY_CARE_PROVIDER_SITE_OTHER): Payer: Medicaid Other | Admitting: Clinical

## 2020-02-06 ENCOUNTER — Other Ambulatory Visit: Payer: Self-pay

## 2020-02-06 ENCOUNTER — Encounter (HOSPITAL_COMMUNITY): Payer: Self-pay | Admitting: Psychiatry

## 2020-02-06 ENCOUNTER — Telehealth (INDEPENDENT_AMBULATORY_CARE_PROVIDER_SITE_OTHER): Payer: Medicaid Other | Admitting: Psychiatry

## 2020-02-06 DIAGNOSIS — F332 Major depressive disorder, recurrent severe without psychotic features: Secondary | ICD-10-CM | POA: Diagnosis not present

## 2020-02-06 DIAGNOSIS — F419 Anxiety disorder, unspecified: Secondary | ICD-10-CM | POA: Diagnosis not present

## 2020-02-06 DIAGNOSIS — F200 Paranoid schizophrenia: Secondary | ICD-10-CM

## 2020-02-06 DIAGNOSIS — F4310 Post-traumatic stress disorder, unspecified: Secondary | ICD-10-CM | POA: Diagnosis not present

## 2020-02-06 MED ORDER — ARIPIPRAZOLE 5 MG PO TABS: 5.0000 mg | ORAL_TABLET | Freq: Every day | ORAL | 0 refills | Status: DC

## 2020-02-06 NOTE — BH Specialist Note (Addendum)
Integrated Behavioral Health Follow Up Visit  MRN: [id number] Name: Theresa Olson  Number of Integrated Behavioral Health Clinician visits: 6/20 Session Start time: 2:00  Session End time: 3:00 Total time: 60  Type of Service: Integrated Behavioral Health- Individual/Family Interpretor:No. Interpretor Name and Language: none  SUBJECTIVE: Theresa Olson is a 49 y.o. female accompanied by self Patient was referred by Raliegh Ip, NP for depression, anxiety. Patient reports the following symptoms/concerns: depression, anxiety Duration of problem: approx 30 years; Severity of problem: severe  OBJECTIVE: Mood: Euthymic and Affect: Labile Risk of harm to self or others: No plan to harm self or others  LIFE CONTEXT: Family and Social:adult children live in [place] School/Work:not employed Self-Care:following inspirational/religious leaders Life Changes:one of her adult sons recently moved in with her  GOALS ADDRESSED: Patient will: 1.  Reduce symptoms of: anxiety and depression  2.  Increase knowledge and/or ability of: coping skills and self-management skills  3.  Demonstrate ability to: Increase healthy adjustment to current life circumstances  INTERVENTIONS: Interventions utilized:  Supportive Counseling and CBT Standardized Assessments completed: Not Needed  ASSESSMENT: Patient currently experiencing severe depression and anxiety and bipolar mood. Accompanied patient on virtual appointment with psychiatrist at [facility], and then held this session afterwards.   Debriefed psychiatry appointment, as patient's had high anxiety about the appointment beforehand. CBT today to continue exploring and challenging unhelpful thoughts related to dynamics with her adult children. Reviewed cognitive rehearsal homework.   Patient is making progress towards goal. This is evident by patient's engagement in addressing unhelpful thoughts in session.  Patient may benefit from  continued IBH services with CBT approach to identify and challenge unhelpful thoughts and behaviors and develop coping skills.  PLAN: 1. Follow up with behavioral health clinician on: 02/13/20 2. Behavioral recommendations: cognitive rehearsal 3. Referral(s): Integrated [employer] (In Clinic)  Abigail Butts, LCSW  [facility] [phone]
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
## 2020-02-10 ENCOUNTER — Ambulatory Visit: Payer: Self-pay | Admitting: Family Medicine

## 2020-02-11 ENCOUNTER — Other Ambulatory Visit: Payer: Self-pay | Admitting: Family Medicine

## 2020-02-11 DIAGNOSIS — L299 Pruritus, unspecified: Secondary | ICD-10-CM

## 2020-02-11 DIAGNOSIS — F411 Generalized anxiety disorder: Secondary | ICD-10-CM

## 2020-02-13 ENCOUNTER — Ambulatory Visit (INDEPENDENT_AMBULATORY_CARE_PROVIDER_SITE_OTHER): Payer: Medicaid Other | Admitting: Clinical

## 2020-02-13 ENCOUNTER — Other Ambulatory Visit: Payer: Self-pay

## 2020-02-13 DIAGNOSIS — F419 Anxiety disorder, unspecified: Secondary | ICD-10-CM | POA: Diagnosis not present

## 2020-02-13 DIAGNOSIS — F332 Major depressive disorder, recurrent severe without psychotic features: Secondary | ICD-10-CM | POA: Diagnosis not present

## 2020-02-13 NOTE — BH Specialist Note (Addendum)
Integrated Behavioral Health Follow Up Visit  MRN: [id number] Name: Theresa Olson  Number of Integrated Behavioral Health Clinician visits: 7/20 Session Start time: 11:00  Session End time: 12:00 Total time: 60  Type of Service: Integrated Behavioral Health- Individual/Family Interpretor:No. Interpretor Name and Language: none  SUBJECTIVE: Theresa Olson is a 49 y.o. female accompanied by self Patient was referred by Raliegh Ip, NP  for depression, anxiety. Patient reports the following symptoms/concerns: depression, anxiety Duration of problem: approx 30 years; Severity of problem: severe  OBJECTIVE: Mood: Anxious and Euthymic and Affect: Appropriate and Tearful Risk of harm to self or others: No plan to harm self or others  LIFE CONTEXT: Family and Social:adult children live in [place] and around [place] School/Work:not employed Self-Care:following inspirational/religious leaders  Life Changes:conflicts with adult children  GOALS ADDRESSED: Patient will: 1.  Reduce symptoms of: anxiety and depression  2.  Increase knowledge and/or ability of: coping skills and self-management skills  3.  Demonstrate ability to: Increase healthy adjustment to current life circumstances  INTERVENTIONS: Interventions utilized:  Mindfulness or Relaxation Training and CBT Standardized Assessments completed: Not Needed  ASSESSMENT: Patient currently experiencing severe anxiety and bipolar mood. Patient often ruminates on childhood experience of her father leaving; she reports persistent low mood related to this as a child, and continues to become tearful when thinking about it. CBT today to process thoughts and beliefs about self around this issue. Mindfulness exercise for grounding and decreasing distress.   Patient is making progress towards goal.This is evident by patient's engagement in addressing unhelpful thoughts in session and practicing cognitive rehearsal outside of session, as  well as practicing new coping and distress tolerance strategies.   Patient may benefit from continued IBH services with CBT approach to challenge unhelpful thoughts and behaviors as well as to develop coping skills and increase distress tolerance.   PLAN: 1. Follow up with behavioral health clinician on: 02/20/20 2. Behavioral recommendations: cognitive rehearsal 3. Referral(s): Integrated [employer] (In Clinic)  Abigail Butts, LCSW  [facility] [phone]

## 2020-02-18 ENCOUNTER — Ambulatory Visit: Payer: Self-pay | Admitting: Family Medicine

## 2020-02-19 ENCOUNTER — Telehealth (HOSPITAL_COMMUNITY): Payer: Medicaid Other | Admitting: Psychiatry

## 2020-02-20 ENCOUNTER — Ambulatory Visit: Payer: Self-pay | Admitting: Clinical

## 2020-02-24 ENCOUNTER — Telehealth (INDEPENDENT_AMBULATORY_CARE_PROVIDER_SITE_OTHER): Payer: Medicaid Other | Admitting: Psychiatry

## 2020-02-24 ENCOUNTER — Ambulatory Visit (INDEPENDENT_AMBULATORY_CARE_PROVIDER_SITE_OTHER): Payer: Medicaid Other | Admitting: Clinical

## 2020-02-24 ENCOUNTER — Encounter (HOSPITAL_COMMUNITY): Payer: Self-pay | Admitting: Psychiatry

## 2020-02-24 ENCOUNTER — Other Ambulatory Visit: Payer: Self-pay

## 2020-02-24 DIAGNOSIS — F4310 Post-traumatic stress disorder, unspecified: Secondary | ICD-10-CM

## 2020-02-24 DIAGNOSIS — F419 Anxiety disorder, unspecified: Secondary | ICD-10-CM

## 2020-02-24 DIAGNOSIS — F200 Paranoid schizophrenia: Secondary | ICD-10-CM | POA: Diagnosis not present

## 2020-02-24 DIAGNOSIS — F332 Major depressive disorder, recurrent severe without psychotic features: Secondary | ICD-10-CM

## 2020-02-24 MED ORDER — ARIPIPRAZOLE 5 MG PO TABS: 5.0000 mg | ORAL_TABLET | Freq: Every day | ORAL | 1 refills | Status: DC

## 2020-02-24 NOTE — BH Specialist Note (Addendum)
Integrated Behavioral Health Follow Up Visit  MRN: [id number] Name: Theresa Olson  Number of Integrated Behavioral Health Clinician visits: 8/20 Session Start time: 10:30  Session End time: 11:30 Total time: 60  Type of Service: Integrated Behavioral Health- Individual/Family Interpretor:No. Interpretor Name and Language: none  SUBJECTIVE: Theresa Olson is a 49 y.o. female accompanied by self Patient was referred by Raliegh Ip, NP for depression, anxiety. Patient reports the following symptoms/concerns: depression, anxiety Duration of problem: approx 30 years; Severity of problem: severe  OBJECTIVE: Mood: Euthymic and Affect: Appropriate Risk of harm to self or others: No plan to harm self or others  LIFE CONTEXT: Family and Social:adult children live in [place] and around [place] School/Work:not employed Self-Care:following inspirational/religious leaders  Life Changes:conflicts with adult children  GOALS ADDRESSED: Patient will: 1.  Reduce symptoms of: anxiety and depression  2.  Increase knowledge and/or ability of: coping skills and self-management skills  3.  Demonstrate ability to: Increase healthy adjustment to current life circumstances  INTERVENTIONS: Interventions utilized:  Mindfulness or Relaxation Training and CBT Standardized Assessments completed: Not Needed  ASSESSMENT: Patient currently experiencing severe anxietyand bipolar mood. Patient recently connected with psychiatry at [facility]. Had follow up psych appointment via phone today. CSW accompanied patient for that appointment and then held this session afterwards.Patient started on Zyprexa by psychiatry. Reported improvement in sleep and symptoms today after being on the medication for two weeks. Patient with increased insight into nature of her illness. Empowered patient to attend next follow up appointment with psychiatrist independently.  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 Challenged unhelpful thought about self and ability to succeed at test. Practiced new mindful breathing technique to use during the test to reduce anxiety and distress.   Patient is making progress towards goal.  States she uses cognitive rehearsal in addressing unhelpful thoughts and uses breathing/mindful exercises practiced in sessions and finds them helpful.  Patient may benefit from continued IBH services with CBT approach to challenge unhelpful thoughts and behaviors as well as to develop coping skills.  PLAN: 1. Follow up with behavioral health clinician on: Will check in briefly at PCP appointment 02/28/20 and will have IBH follow up week of 03/02/20 2. Behavioral recommendations: cognitive rehearsal, practice mindfulness techniques 3. Referral(s): Integrated [employer] (In Clinic)  Abigail Butts, LCSW  [facility] [phone]
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
## 2020-02-28 ENCOUNTER — Ambulatory Visit: Payer: Self-pay | Admitting: Family Medicine

## 2020-03-05 ENCOUNTER — Ambulatory Visit (INDEPENDENT_AMBULATORY_CARE_PROVIDER_SITE_OTHER): Payer: Medicaid Other | Admitting: Clinical

## 2020-03-05 DIAGNOSIS — F332 Major depressive disorder, recurrent severe without psychotic features: Secondary | ICD-10-CM

## 2020-03-05 DIAGNOSIS — F419 Anxiety disorder, unspecified: Secondary | ICD-10-CM

## 2020-03-05 NOTE — BH Specialist Note (Addendum)
Integrated Behavioral Health Visit via Telemedicine (Telephone)  03/05/2020 Trinia Georgi [id number]  Number of Integrated Behavioral Health visits: 9/20 Session Start time: 12:30  Session End time: 1:00 Total time: 30 minutes  Referring Provider: Raliegh Ip, NP Type of Service: Individual Patient or Family location: [address] [facility] Provider location: [facility] All persons participating in visit: patient, CSW   I connected with [name] and/or [name] patient by telephone and verified that I am speaking with the correct person using two identifiers.   Discussed confidentiality: Yes   Confirmed demographics & insurance:  Yes   I discussed that engaging in this virtual visit, they consent to the provision of behavioral healthcare and the services will be billed under their insurance.   Patient and/or legal guardian expressed understanding and consented to virtual visit: Yes   PRESENTING CONCERNS: Patient or family reports the following symptoms/concerns: depression, anxiety Duration of problem: approx 30 years; Severity of problem: severe  STRENGTHS (Protective Factors/Coping Skills): Concrete supports in place (healthy food, safe environments, etc.)  ASSESSMENT: Patient currently experiencing anxiety and depression and recently diagnosed with schizophrenia and PTSD by psychiatry. Patient problems exacerbated by family dynamics and living situation.   GOALS ADDRESSED: Patient will: 1.  Reduce symptoms of: anxiety and depression  2.  Increase knowledge and/or ability of: coping skills and self-management skills  3.  Demonstrate ability to: Increase healthy adjustment to current life circumstances and Improve medication compliance   Progress of Goals: Ongoing  INTERVENTIONS: Interventions utilized:  Supportive Counseling and CBT Standardized Assessments completed & reviewed: Not Needed  CBT today to explore unhelpful thoughts  about self related to conflict with adult children. Discussed pros and cons of engaging in reactions that may continue to escalate confrontations with family members. Reviewed more helpful behaviors discussed in previous sessions. Supportive counseling and encouragement to continue to use positive self talk and self-compassion. Reinforced healthy coping skills and behaviors patient has learned and has been using, such as breathing exercises, cognitive rehearsal, and disengaging from unproductive confrontations with family members.   OUTCOME: Patient Response: Patient has integrated new healthy coping skills and behaviors into everyday life. Patient reports improvement in mood and sleep since starting the new medication from psychiatry. Patient engages actively in session to explore unhelpful thoughts and learn new coping skills.   PLAN: 1. Follow up with behavioral health clinician on: 03/11/20 2. Behavioral recommendations: pros and cons of unhelpful reactions during conflicts 3. Referral(s): Integrated [employer] (In Clinic)  I discussed the assessment and treatment plan with the patient and/or parent/guardian. They were provided an opportunity to ask questions and all were answered. They agreed with the plan and demonstrated an understanding of the instructions.   They were advised to call back or seek an in-person evaluation as appropriate.  I discussed that the purpose of this visit is to provide behavioral health care while limiting exposure to the novel coronavirus.  Discussed there is a possibility of technology failure and discussed alternative modes of communication if that failure occurs.  Abigail Butts, LCSW [facility] [phone]

## 2020-03-11 ENCOUNTER — Ambulatory Visit (INDEPENDENT_AMBULATORY_CARE_PROVIDER_SITE_OTHER): Payer: Medicaid Other | Admitting: Clinical

## 2020-03-11 ENCOUNTER — Other Ambulatory Visit: Payer: Self-pay

## 2020-03-11 DIAGNOSIS — F332 Major depressive disorder, recurrent severe without psychotic features: Secondary | ICD-10-CM | POA: Diagnosis not present

## 2020-03-11 DIAGNOSIS — F419 Anxiety disorder, unspecified: Secondary | ICD-10-CM | POA: Diagnosis not present

## 2020-03-11 NOTE — BH Specialist Note (Addendum)
Integrated Behavioral Health Follow Up Visit  MRN: [id number] Name: Theresa Olson  Number of Integrated Behavioral Health Clinician visits: 10/20 Session Start time: 11:15  Session End time: 12:15 Total time: 60  Type of Service: Integrated Behavioral Health- Individual/Family Interpretor:No. Interpretor Name and Language: none  SUBJECTIVE: Theresa Olson is a 49 y.o. female accompanied by self Patient was referred by Raliegh Ip, NP for depression, anxiety. Patient reports the following symptoms/concerns: depression, anxiety Duration of problem: approx. 30 years; Severity of problem: severe  OBJECTIVE: Mood: Euthymic and Affect: Appropriate Risk of harm to self or others: No plan to harm self or others  STRENGTHS Scientist, research (medical)): Concrete supports in place (healthy food, safe environments, etc.)  ASSESSMENT: Patient currently experiencing anxiety and depression and recently diagnosed with schizophrenia and PTSD by psychiatry. Patient problems exacerbated by family dynamics and living situation.   GOALS ADDRESSED: Patient will: 1.  Reduce symptoms of: anxiety and depression  2.  Increase knowledge and/or ability of: coping skills and self-management skills  3.  Demonstrate ability to: Increase healthy adjustment to current life circumstances and Improve medication compliance  Progress of Goals: Ongoing  INTERVENTIONS: Interventions utilized:  Mindfulness or Relaxation Training and Supportive Counseling Standardized Assessments completed: Not Needed   Patient in need of assistance with social security disability appeal. Made referral to [facility] [address] [facility]) through medical legal partnership. Patient consented to referral.  Patient was also not sure of her psychiatry follow up appointment date. Called [facility] together with patient and confirmed appointment for 04/07/20.   Supportive counseling regarding ongoing conflicts and challenges with  family dynamics. Discussion on impulse resistance and consideration of consequences for engaging in emotionally elevated conflicts with others. Review and practice of coping skills to use during moments patient is emotionally elevated. Practiced two new mindfulness exercises.   OUTCOME: Patient Response: Patient engaged in mindfulness practice and indicated plan to use these new skills in moments of distress. Patient reported continued improvement in mood since starting new medication from psychiatry.   PLAN: 1. Follow up with behavioral health clinician on: 03/19/20 2. Behavioral recommendations:  3. Referral(s): Integrated [employer] (In Clinic)  Abigail Butts, LCSW [facility] [phone]

## 2020-03-19 ENCOUNTER — Ambulatory Visit: Payer: Self-pay | Admitting: Clinical

## 2020-03-20 ENCOUNTER — Ambulatory Visit (INDEPENDENT_AMBULATORY_CARE_PROVIDER_SITE_OTHER): Payer: Medicaid Other | Admitting: Clinical

## 2020-03-20 ENCOUNTER — Other Ambulatory Visit: Payer: Self-pay

## 2020-03-20 DIAGNOSIS — F332 Major depressive disorder, recurrent severe without psychotic features: Secondary | ICD-10-CM | POA: Diagnosis not present

## 2020-03-20 DIAGNOSIS — F419 Anxiety disorder, unspecified: Secondary | ICD-10-CM | POA: Diagnosis not present

## 2020-03-20 NOTE — BH Specialist Note (Addendum)
Integrated Behavioral Health Follow Up Visit  MRN: [id number] Name: Theresa Olson  Number of Integrated Behavioral Health Clinician visits: 11/20 Session Start time: 11:15  Session End time: 12:15 Total time: 60  Type of Service: Integrated Behavioral Health- Individual/Family Interpretor:No. Interpretor Name and Language: none  SUBJECTIVE: Theresa Olson is a 49 y.o. female accompanied by self Patient was referred by Raliegh Ip, NP for depression, anxiety Patient reports the following symptoms/concerns: depression, anxiety Duration of problem: approx. 30 years; Severity of problem: severe  OBJECTIVE: Mood: Euthymic and Affect: Appropriate Risk of harm to self or others: No plan to harm self or others  STRENGTHS Scientist, research (medical)): Concrete supports in place (healthy food, safe environments, etc.)  ASSESSMENT: Patient currently experiencing anxiety and depression and recently diagnosed with schizophrenia and PTSD by psychiatry.Patient problems exacerbated by family dynamics and living situation.   GOALS ADDRESSED: Patient will: 1.  Reduce symptoms of: anxiety and depression  2.  Increase knowledge and/or ability of: coping skills and self-management skills  3.  Demonstrate ability to: Increase healthy adjustment to current life circumstances   Progress of Goals: Ongoing  INTERVENTIONS: Interventions utilized:  Mindfulness or Management consultant, Supportive Counseling and CBT Standardized Assessments completed: Not Needed  Supportive counseling and preparation for upcoming psychiatry follow up. Patient anxious about attending the appointment on her own, as CSW provided support during last two appointments.   CBT to process recent interactions with adult children in context of challenging family dynamics. Review of newly learned coping skills to use during and after emotionally elevated interpersonal interactions. Introduced new mindful exercise and  practiced with patient in session.  Patient also expresses anxiety related to going out in public and being around other people, though she wishes to do this. She reports missing out on valued activities like family outings due to this fear. CBT today to process this and began planning exposure activities. Will plan to continue this at next session.    OUTCOME: Patient Response:Patient actively participated in mindfulness exercise and discussion about personalizing the exercise for her needs. Patient actively engaged in discussion about exposure plan for anxiety provoking activities.   Patient may benefit from continued counseling including mindfulness to build coping and self-management strategies, and CBT to address mood symptoms.  PLAN: 1. Follow up with behavioral health clinician on: 03/26/20 2. Behavioral recommendations: practice mindfulness skills 3. Referral(s): Integrated [employer] (In Clinic)   Abigail Butts, LCSW  [facility] [phone]

## 2020-03-26 ENCOUNTER — Ambulatory Visit (INDEPENDENT_AMBULATORY_CARE_PROVIDER_SITE_OTHER): Payer: Medicaid Other | Admitting: Clinical

## 2020-03-26 DIAGNOSIS — F332 Major depressive disorder, recurrent severe without psychotic features: Secondary | ICD-10-CM

## 2020-03-26 DIAGNOSIS — F419 Anxiety disorder, unspecified: Secondary | ICD-10-CM

## 2020-03-26 NOTE — BH Specialist Note (Addendum)
Integrated Behavioral Health Visit via Telemedicine (Telephone)  03/26/2020 Lind Ausley [id number]  Number of Integrated Behavioral Health visits: 12/20 Session Start time: 10:00  Session End time: 11:00 Total time: 60 minutes  Referring Provider: Raliegh Ip, NP Type of Service: Individual Patient or Family location: [address] [facility] Provider location: [facility] All persons participating in visit: CSW, patient   I connected with Theresa Olson and/or [name] self by telephone and verified that I am speaking with the correct person using two identifiers.   Discussed confidentiality: Yes   Confirmed demographics & insurance:  Yes   I discussed that engaging in this virtual visit, they consent to the provision of behavioral healthcare and the services will be billed under their insurance.   Patient and/or legal guardian expressed understanding and consented to virtual visit: Yes   PRESENTING CONCERNS: Patient or family reports the following symptoms/concerns: depression, anxiety Duration of problem: approx 30 years; Severity of problem: severe  STRENGTHS (Protective Factors/Coping Skills): Concrete supports in place (healthy food, safe environments, etc.) and Physical Health (exercise, healthy diet, medication compliance, etc.)  ASSESSMENT: Patient currently experiencing anxiety and depression and recently diagnosed with schizophrenia and PTSD by psychiatry.Patient problems exacerbated by family dynamics and living situation.    GOALS ADDRESSED: Patient will: 1.  Reduce symptoms of: anxiety and depression  2.  Increase knowledge and/or ability of: coping skills and self-management skills  3.  Demonstrate ability to: Increase healthy adjustment to current life circumstances   Progress of Goals: Ongoing  INTERVENTIONS: Interventions utilized:  Brief CBT and Supportive Counseling Standardized Assessments completed & reviewed: Not  Needed  CBT today to explore exacerbating factors to anxiety about being in public, crowded places. Patient fears something bad will happen in a crowded place, but wishes to go to places that may be crowded to participate in family outings and other things she is interested in doing. CBT to challenge unhelpful thoughts related to this fear. Excessive exposure to negative events in the news was determined to be a significant contributor to these negative thoughts. Activation plan made with patient to decrease exposure to this negative input and increase exposure to positive input of things she enjoys.   OUTCOME: Patient Response: Patient participated in exploration of unhelpful thoughts.   PLAN: 1. Follow up with behavioral health clinician on: 04/02/20 2. Behavioral recommendations:  3. Referral(s): Integrated [employer] (In Clinic)  I discussed the assessment and treatment plan with the patient and/or parent/guardian. They were provided an opportunity to ask questions and all were answered. They agreed with the plan and demonstrated an understanding of the instructions.   They were advised to call back or seek an in-person evaluation as appropriate.  I discussed that the purpose of this visit is to provide behavioral health care while limiting exposure to the novel coronavirus.  Discussed there is a possibility of technology failure and discussed alternative modes of communication if that failure occurs.  Abigail Butts, LCSW [facility] [phone]

## 2020-03-27 ENCOUNTER — Other Ambulatory Visit: Payer: Self-pay

## 2020-03-27 DIAGNOSIS — R1030 Lower abdominal pain, unspecified: Secondary | ICD-10-CM | POA: Insufficient documentation

## 2020-03-27 DIAGNOSIS — Z87891 Personal history of nicotine dependence: Secondary | ICD-10-CM | POA: Insufficient documentation

## 2020-03-27 DIAGNOSIS — Z79899 Other long term (current) drug therapy: Secondary | ICD-10-CM | POA: Diagnosis not present

## 2020-03-27 DIAGNOSIS — R350 Frequency of micturition: Secondary | ICD-10-CM | POA: Insufficient documentation

## 2020-03-27 DIAGNOSIS — R509 Fever, unspecified: Secondary | ICD-10-CM | POA: Insufficient documentation

## 2020-03-27 DIAGNOSIS — Z20822 Contact with and (suspected) exposure to covid-19: Secondary | ICD-10-CM | POA: Insufficient documentation

## 2020-03-27 DIAGNOSIS — I10 Essential (primary) hypertension: Secondary | ICD-10-CM | POA: Diagnosis not present

## 2020-03-27 DIAGNOSIS — M791 Myalgia, unspecified site: Secondary | ICD-10-CM | POA: Diagnosis present

## 2020-03-28 ENCOUNTER — Other Ambulatory Visit: Payer: Self-pay

## 2020-03-28 ENCOUNTER — Encounter (HOSPITAL_COMMUNITY): Payer: Self-pay

## 2020-03-28 ENCOUNTER — Emergency Department (HOSPITAL_COMMUNITY)
Admission: EM | Admit: 2020-03-28 | Discharge: 2020-03-28 | Disposition: A | Discharge status: Home / Self Care | Payer: Medicaid Other | Attending: Emergency Medicine | Admitting: Emergency Medicine

## 2020-03-28 DIAGNOSIS — R509 Fever, unspecified: Secondary | ICD-10-CM

## 2020-03-28 LAB — COMPREHENSIVE METABOLIC PANEL
ALT: 27 U/L (ref 0–44)
AST: 27 U/L (ref 15–41)
Albumin: 4.1 g/dL (ref 3.5–5.0)
Alkaline Phosphatase: 78 U/L (ref 38–126)
Anion gap: 10 (ref 5–15)
BUN: 16 mg/dL (ref 6–20)
CO2: 25 mmol/L (ref 22–32)
Calcium: 9.3 mg/dL (ref 8.9–10.3)
Chloride: 102 mmol/L (ref 98–111)
Creatinine, Ser: 0.85 mg/dL (ref 0.44–1.00)
GFR, Estimated: >60 mL/min (ref >60)
Glucose, Bld: 115 mg/dL — ABNORMAL HIGH (ref 70–99)
Potassium: 3.4 mmol/L — ABNORMAL LOW (ref 3.5–5.1)
Sodium: 137 mmol/L (ref 135–145)
Total Bilirubin: 0.9 mg/dL (ref 0.3–1.2)
Total Protein: 8.1 g/dL (ref 6.5–8.1)

## 2020-03-28 LAB — CBC WITH DIFFERENTIAL/PLATELET
Abs Immature Granulocytes: 0.02 10*3/uL (ref 0.00–0.07)
Basophils Absolute: 0.1 10*3/uL (ref 0.0–0.1)
Basophils Relative: 1 %
Eosinophils Absolute: 0.3 10*3/uL (ref 0.0–0.5)
Eosinophils Relative: 3 %
HCT: 39.4 % (ref 36.0–46.0)
Hemoglobin: 13.1 g/dL (ref 12.0–15.0)
Immature Granulocytes: 0 %
Lymphocytes Relative: 34 %
Lymphs Abs: 3.2 10*3/uL (ref 0.7–4.0)
MCH: 30.6 pg (ref 26.0–34.0)
MCHC: 33.2 g/dL (ref 30.0–36.0)
MCV: 92.1 fL (ref 80.0–100.0)
Monocytes Absolute: 0.8 10*3/uL (ref 0.1–1.0)
Monocytes Relative: 8 %
Neutro Abs: 5.1 10*3/uL (ref 1.7–7.7)
Neutrophils Relative %: 54 %
Platelets: 259 10*3/uL (ref 150–400)
RBC: 4.28 MIL/uL (ref 3.87–5.11)
RDW: 14.6 % (ref 11.5–15.5)
WBC: 9.5 10*3/uL (ref 4.0–10.5)
nRBC: 0 % (ref 0.0–0.2)

## 2020-03-28 LAB — URINALYSIS, ROUTINE W REFLEX MICROSCOPIC
Bilirubin Urine: NEGATIVE
Glucose, UA: NEGATIVE mg/dL
Hgb urine dipstick: NEGATIVE
Ketones, ur: NEGATIVE mg/dL
Nitrite: NEGATIVE
Protein, ur: 30 mg/dL — AB
Specific Gravity, Urine: 1.021 (ref 1.005–1.030)
pH: 5 (ref 5.0–8.0)

## 2020-03-28 LAB — RESPIRATORY PANEL BY RT PCR (FLU A&B, COVID)
Influenza A by PCR: NEGATIVE
Influenza B by PCR: NEGATIVE
SARS Coronavirus 2 by RT PCR: NEGATIVE

## 2020-03-28 LAB — LIPASE, BLOOD: Lipase: 39 U/L (ref 11–51)

## 2020-03-28 MED ORDER — LACTATED RINGERS IV BOLUS
30.0000 mL/kg | Freq: Once | INTRAVENOUS | Status: AC
  Administered 2020-03-28: 2517 mL via INTRAVENOUS

## 2020-03-28 NOTE — ED Provider Notes (Signed)
[facility]-EMERGENCY DEPT Provider Note   CSN: [id number] Arrival date & time: 03/27/20  2356     History No chief complaint on file.   Theresa Olson is a 49 y.o. female.  Patient presents to the emergency department with concerns of possible Covid infection.  Patient reports that she has not been feeling well for at least 3 days.  She reports chills and feeling cold with generalized body aches.  3 days ago she had a sore throat but that has resolved.  She has not had any cough, chest congestion, shortness of breath.  She has noticed urinary frequency and complains of low abdominal pain.  No hematuria.  She has not had any associated nausea, vomiting or diarrhea.        Past Medical History:  Diagnosis Date  . Anxiety   . Anxiety disorder   . Carpal tunnel syndrome   . Depression   . Hot flashes   . Hyperlipidemia 10/2019  . Hypertension   . Insomnia   . Vitamin D deficiency 12/2018    Patient Active Problem List   Diagnosis Date Noted  . Insomnia 11/10/2019  . Hot flashes 11/10/2019  . Anxiety attack 01/28/2019  . Carpal tunnel syndrome on right 01/28/2019  . Amenorrhea 11/17/2016  . Frequent No-show for appointment 08/11/2016  . Recurrent major depression-severe (HCC) 11/02/2011  . TOBACCO USER 03/07/2009  . ANXIETY DISORDER, GENERALIZED 08/20/2008    Past Surgical History:  Procedure Laterality Date  . CESAREAN SECTION       OB History    Gravida  8   Para  5   Term  5   Preterm      AB  3   Living  5     SAB  3   TAB      Ectopic      Multiple      Live Births  5           Family History  Problem Relation Age of Onset  . Hyperlipidemia Mother   . Diabetes Mother   . Hyperlipidemia Father   . Diabetes Father   . Heart disease Father     Social History   Tobacco Use  . Smoking status: Former Smoker    Types: Cigarettes    Quit date: 08/28/2016    Years since quitting: 3.5  . Smokeless tobacco:  Never Used  Substance Use Topics  . Alcohol use: Yes    Comment: 2-3 glasses of vodka per day  . Drug use: Yes    Types: Marijuana    Comment: smokes every few days    Home Medications Prior to Admission medications   Medication Sig Start Date End Date Taking? Authorizing Provider  ALPRAZolam Prudy Feeler) 1 MG tablet Take 1 tablet (1 mg total) by mouth at bedtime as needed for anxiety. Patient not taking: Reported on 12/09/2019 01/28/19   Kallie Locks, FNP  amLODipine (NORVASC) 5 MG tablet Take 1 tablet (5 mg total) by mouth daily. 12/09/19   Kallie Locks, FNP  ARIPiprazole (ABILIFY) 5 MG tablet Take 1 tablet (5 mg total) by mouth daily. 02/24/20 02/23/21  Arfeen, Phillips Grout, MD  atorvastatin (LIPITOR) 10 MG tablet Take 1 tablet (10 mg total) by mouth daily. 11/09/19   Kallie Locks, FNP  gabapentin (NEURONTIN) 300 MG capsule Take 1 capsule (300 mg total) by mouth 3 (three) times daily. 01/28/19   Kallie Locks, FNP  GLUTATHIONE PO Take 1  tablet by mouth daily.    [provider]  hydrOXYzine (ATARAX/VISTARIL) 10 MG tablet Take 1 tablet (10 mg total) by mouth 3 (three) times daily as needed. 11/08/19   Kallie Locks, FNP  Methylsulfonylmethane (MSM) 1000 MG CAPS Take 1,000 mg by mouth daily. Patient not taking: Reported on 12/09/2019    [provider]  PARoxetine (PAXIL) 40 MG tablet TAKE 1 TABLET BY MOUTH EVERY DAY IN THE MORNING 01/07/20   Kallie Locks, FNP  pregabalin (LYRICA) 50 MG capsule Take 1 capsule (50 mg total) by mouth 2 (two) times daily. 11/08/19   Kallie Locks, FNP  vitamin C (ASCORBIC ACID) 500 MG tablet Take 500 mg by mouth every morning.    [provider]  Vitamin D, Ergocalciferol, (DRISDOL) 1.25 MG (50000 UNIT) CAPS capsule TAKE 1 CAPSULE (50,000 UNITS TOTAL) BY MOUTH EVERY 7 (SEVEN) DAYS. 12/18/19   Kallie Locks, FNP    Allergies    Zoloft [sertraline hcl]  Review of Systems   Review of Systems  Constitutional:  Positive for chills.  HENT: Positive for sore throat.   Gastrointestinal: Positive for abdominal pain.  Genitourinary: Positive for frequency.  All other systems reviewed and are negative.   Physical Exam Updated Vital Signs BP (!) 155/115 (BP Location: Left Arm)   Pulse (!) 102   Temp 99.4 F (37.4 C) (Oral)   Resp 20   Ht 5' (1.524 m)   Wt 83.9 kg   LMP 04/18/2017   SpO2 97%   BMI 36.13 kg/m   Physical Exam Vitals and nursing note reviewed.  Constitutional:      General: She is not in acute distress.    Appearance: Normal appearance. She is well-developed.  HENT:     Head: Normocephalic and atraumatic.     Right Ear: Hearing normal.     Left Ear: Hearing normal.     Nose: Nose normal.  Eyes:     Conjunctiva/sclera: Conjunctivae normal.     Pupils: Pupils are equal, round, and reactive to light.  Cardiovascular:     Rate and Rhythm: Regular rhythm.     Heart sounds: S1 normal and S2 normal. No murmur heard.  No friction rub. No gallop.   Pulmonary:     Effort: Pulmonary effort is normal. No respiratory distress.     Breath sounds: Normal breath sounds.  Chest:     Chest wall: No tenderness.  Abdominal:     General: Bowel sounds are normal.     Palpations: Abdomen is soft.     Tenderness: There is abdominal tenderness in the suprapubic area. There is no guarding or rebound. Negative signs include Murphy's sign and McBurney's sign.     Hernia: No hernia is present.  Musculoskeletal:        General: Normal range of motion.     Cervical back: Normal range of motion and neck supple.  Skin:    General: Skin is warm and dry.     Findings: No rash.  Neurological:     Mental Status: She is alert and oriented to person, place, and time.     GCS: GCS eye subscore is 4. GCS verbal subscore is 5. GCS motor subscore is 6.     Cranial Nerves: No cranial nerve deficit.     Sensory: No sensory deficit.     Coordination: Coordination normal.  Psychiatric:        Speech:  Speech normal.  Behavior: Behavior normal.        Thought Content: Thought content normal.     ED Results / Procedures / Treatments   Labs (all labs ordered are listed, but only abnormal results are displayed) Labs Reviewed  COMPREHENSIVE METABOLIC PANEL - Abnormal; Notable for the following components:      Result Value   Potassium 3.4 (*)    Glucose, Bld 115 (*)    All other components within normal limits  URINALYSIS, ROUTINE W REFLEX MICROSCOPIC - Abnormal; Notable for the following components:   Color, Urine AMBER (*)    APPearance CLOUDY (*)    Protein, ur 30 (*)    Leukocytes,Ua TRACE (*)    Bacteria, UA RARE (*)    All other components within normal limits  RESPIRATORY PANEL BY RT PCR (FLU A&B, COVID)  CBC WITH DIFFERENTIAL/PLATELET  LIPASE, BLOOD    EKG None  Radiology No results found.  Procedures Procedures (including critical care time)  Medications Ordered in ED Medications  lactated ringers bolus 2,517 mL (has no administration in time range)    ED Course  I have reviewed the triage vital signs and the nursing notes.  Pertinent labs & imaging results that were available during my care of the patient were reviewed by me and considered in my medical decision making (see chart for details).    MDM Rules/Calculators/A&P                          Patient was concerned that she might have Covid because she has felt generalized aches, muscle pains and chills.  She has not taken her temperature.  She did have a sore throat several days ago with that was the only other symptom other than diarrhea.  Covid and flu are negative.  Patient had some slight suprapubic area tenderness on exam without guarding or rebound.  She did indicate urinary frequency.  Blood work is normal.  Urinalysis does not suggest obvious infection.  Repeat examination reveals no tenderness at this time.  Discussed observation at home versus CT scan.  She is comfortable going home and  returning to the ER if she has any worsening or new symptoms for repeat evaluation.  Final Clinical Impression(s) / ED Diagnoses Final diagnoses:  Febrile illness, acute    Rx / DC Orders ED Discharge Orders    None       Shakura Cowing, Canary Brim, MD 03/28/20 [phone]

## 2020-03-28 NOTE — ED Triage Notes (Signed)
Pt c/o covid sx.

## 2020-03-29 ENCOUNTER — Other Ambulatory Visit (HOSPITAL_COMMUNITY): Payer: Self-pay | Admitting: Psychiatry

## 2020-03-29 DIAGNOSIS — F200 Paranoid schizophrenia: Secondary | ICD-10-CM

## 2020-03-29 DIAGNOSIS — F4310 Post-traumatic stress disorder, unspecified: Secondary | ICD-10-CM

## 2020-03-30 ENCOUNTER — Telehealth: Payer: Self-pay

## 2020-03-30 NOTE — Telephone Encounter (Signed)
Transition Care Management Unsuccessful Follow-up Telephone Call  Date of discharge and from where:  03/28/2020 from [facility]  Attempts:  1st Attempt  Reason for unsuccessful TCM follow-up call:  Left voice message

## 2020-03-31 NOTE — Telephone Encounter (Signed)
Transition Care Management Follow-up Telephone Call  Date of discharge and from where: 03/28/2020 from [facility]  How have you been since you were released from the hospital? Patient states that she if feeling much better.   Any questions or concerns? No  Items Reviewed:  Did the pt receive and understand the discharge instructions provided? Yes   Medications obtained and verified? Yes   Other? No   Any new allergies since your discharge? No   Dietary orders reviewed? N/A  Do you have support at home? Yes   Functional Questionnaire: (I = Independent and D = Dependent) ADLs: I  Bathing/Dressing- I  Meal Prep- I  Eating- I  Maintaining continence- I  Transferring/Ambulation- I  Managing Meds- I  Follow up appointments reviewed:   Are transportation arrangements needed? No   If their condition worsens, is the pt aware to call PCP or go to the Emergency Dept.? Yes  Was the patient provided with contact information for the PCP's office or ED? Yes  Was to pt encouraged to call back with questions or concerns? Yes

## 2020-04-02 ENCOUNTER — Ambulatory Visit: Payer: Self-pay | Admitting: Clinical

## 2020-04-03 ENCOUNTER — Ambulatory Visit (INDEPENDENT_AMBULATORY_CARE_PROVIDER_SITE_OTHER): Payer: Medicaid Other | Admitting: Clinical

## 2020-04-03 ENCOUNTER — Other Ambulatory Visit: Payer: Self-pay

## 2020-04-03 DIAGNOSIS — F419 Anxiety disorder, unspecified: Secondary | ICD-10-CM | POA: Diagnosis not present

## 2020-04-03 DIAGNOSIS — F332 Major depressive disorder, recurrent severe without psychotic features: Secondary | ICD-10-CM

## 2020-04-03 NOTE — BH Specialist Note (Addendum)
Integrated Behavioral Health Visit via Telemedicine (Telephone)  04/03/2020 Theresa Olson [id number]  Number of Integrated Behavioral Health visits: 13/20 Session Start time: 9:00  Session End time: 10:00 Total time: 60 minutes  Referring Provider: Raliegh Ip, NP Type of Service: Individual  Patient or Family location: [address] [facility] Provider location: [facility] All persons participating in visit: CSW, patient   I connected with Theresa Olson and/or Theresa Olson self by telephone and verified that I am speaking with the correct person using two identifiers.   Discussed confidentiality: Yes   Confirmed demographics & insurance:  Yes   I discussed that engaging in this virtual visit, they consent to the provision of behavioral healthcare and the services will be billed under their insurance.   Patient and/or legal guardian expressed understanding and consented to virtual visit: Yes   PRESENTING CONCERNS: Patient or family reports the following symptoms/concerns: depression, anxiety Duration of problem: approx 30 years; Severity of problem: severe  STRENGTHS (Protective Factors/Coping Skills): Concrete supports in place (healthy food, safe environments, etc.) and Physical Health (exercise, healthy diet, medication compliance, etc.)  ASSESSMENT: Patient currently experiencing anxiety and depression and recently diagnosed with schizophrenia and PTSD by psychiatry.Patient problems exacerbated by family dynamics and living situation. Patient now experiencing housing instability and the recent death of her nephew.   GOALS ADDRESSED: Patient will: 1.  Reduce symptoms of: anxiety and depression  2.  Increase knowledge and/or ability of: coping skills and self-management skills  3.  Demonstrate ability to: Increase healthy adjustment to current life circumstances and Improve medication compliance   Progress of  Goals: Ongoing  INTERVENTIONS: Interventions utilized:  Solution-Focused Strategies, Mindfulness or Relaxation Training and Supportive Counseling Standardized Assessments completed & reviewed: Not Needed  Patient experiencing housing instability. Solution-focused and problem solving strategies used today. Apartment lease is in her son's name and they are now facing eviction. Had previously referred patient to [facility] of [facility]) through [facility] for assistance with disability appeal. Patient has not yet received follow up from [facility]. CSW and patient called [facility] partner and left voicemail requesting call back; added request for assistance with eviction as well. Referred patient to the [employer] [facility]) for assistance with housing. Sent referral through [id number] to [facility] and [employer]. Provided patient with list of emergency shelters if needed.   Supportive counseling related to patient's grief about nephew's death and over stress related to housing situation. Reminded patient about emergency numbers to call if in crisis. Patient denied thoughts of hurting herself or someone else. Mindful breathing exercise for grounding completed with patient.  OUTCOME: Patient Response: Patient reports continuing to use breathing and mindfulness exercises for grounding when emotionally elevated. Patient exhibits increased distress tolerance and resisting urges to act on negative emotions. Patient participated actively in mindfulness exercise.   Patient may benefit from continued counseling with CBT and mindfulness training.   PLAN: 1. Follow up with behavioral health clinician on: 03/30/20 2. Behavioral recommendations:  3. Referral(s): Integrated [employer] (In Clinic)  I discussed the assessment and treatment plan with the patient and/or parent/guardian. They were provided an opportunity to ask questions and all were answered. They agreed with the plan and  demonstrated an understanding of the instructions.   They were advised to call back or seek an in-person evaluation as appropriate.  I discussed that the purpose of this visit is to provide behavioral health care while limiting exposure to the novel coronavirus.  Discussed there  is a possibility of technology failure and discussed alternative modes of communication if that failure occurs.  Theresa Butts, LCSW [facility] [phone]

## 2020-04-07 ENCOUNTER — Telehealth (HOSPITAL_COMMUNITY): Payer: Medicaid Other | Admitting: Psychiatry

## 2020-04-08 ENCOUNTER — Telehealth: Payer: Self-pay | Admitting: Clinical

## 2020-04-08 NOTE — Telephone Encounter (Addendum)
Integrated Behavioral Health General Follow Up Note  04/08/2020 Name: Theresa Olson MRN: [id number] DOB: 1970/08/05 Theresa Olson is a 49 y.o. year old female who sees Kallie Locks, FNP for primary care. LCSW was initially consulted to support with depression and anxiety.  Interpreter: No.   Interpreter Name & Language: none  Assessment: Patient experiencing mental health challenges, as well as SDOH needs. Patient facing eviction, unstably housed. CSW has made referrals.   Ongoing Intervention: Called patient today to remind of these resources and remind patient of other tasks to be completed, such as rescheduling with psychiatry. Patient does not know her schedule for this upcoming week so unable to reschedule right now. Provided patient with psychiatry phone number so she can reschedule. Also advised patient to follow up with Legal [facility] of [facility]) representative for assistance with disability appeal and eviction. Provided patient with this phone number as well. CSW to continue to follow.  Review of patient status, including review of consultants reports, relevant laboratory and other test results, and collaboration with appropriate care team members and the patient's provider was performed as part of comprehensive patient evaluation and provision of services.     Follow up Plan: 1. Follow up tele-visit session scheduled for 04/13/20  Abigail Butts, LCSW [facility] [phone]

## 2020-04-10 ENCOUNTER — Telehealth (HOSPITAL_COMMUNITY): Payer: Medicaid Other | Admitting: Psychiatry

## 2020-04-10 ENCOUNTER — Other Ambulatory Visit: Payer: Self-pay

## 2020-04-13 ENCOUNTER — Encounter: Payer: Medicaid Other | Admitting: Clinical

## 2020-04-14 ENCOUNTER — Ambulatory Visit (INDEPENDENT_AMBULATORY_CARE_PROVIDER_SITE_OTHER): Payer: Medicaid Other | Admitting: Clinical

## 2020-04-14 DIAGNOSIS — F419 Anxiety disorder, unspecified: Secondary | ICD-10-CM | POA: Diagnosis not present

## 2020-04-14 DIAGNOSIS — F4310 Post-traumatic stress disorder, unspecified: Secondary | ICD-10-CM

## 2020-04-14 DIAGNOSIS — F209 Schizophrenia, unspecified: Secondary | ICD-10-CM | POA: Diagnosis not present

## 2020-04-14 DIAGNOSIS — F332 Major depressive disorder, recurrent severe without psychotic features: Secondary | ICD-10-CM

## 2020-04-14 DIAGNOSIS — F418 Other specified anxiety disorders: Secondary | ICD-10-CM

## 2020-04-14 NOTE — BH Specialist Note (Addendum)
Integrated Behavioral Health Visit via Telemedicine (Telephone)  04/14/2020 Theresa Olson [id number]  Number of Integrated Behavioral Health visits: 14/20 Session Start time: 11:25  Session End time: 12:15 Total time: 50  minutes  Referring Provider: Raliegh Ip, NP Type of Service: Individual Patient or Family location: [address] [facility] Provider location: [facility] All persons participating in visit: CSW, patient   I connected with Theresa Olson and/or [name] self by telephone and verified that I am speaking with the correct person using two identifiers.   Discussed confidentiality: Yes   Confirmed demographics & insurance:  Yes   I discussed that engaging in this virtual visit, they consent to the provision of behavioral healthcare and the services will be billed under their insurance.   Patient and/or legal guardian expressed understanding and consented to virtual visit: Yes   PRESENTING CONCERNS: Patient or family reports the following symptoms/concerns: depression, anxiety Duration of problem: approx. 30 years; Severity of problem: severe  STRENGTHS (Protective Factors/Coping Skills): Physical Health (exercise, healthy diet, medication compliance, etc.)  ASSESSMENT: Patient currently experiencing anxiety, depression, schizophrenia and PTSD.Patient problems exacerbated by family dynamics and living situation. Patient now experiencing housing instability and the recent death of her nephew.    GOALS ADDRESSED: Patient will: 1.  Reduce symptoms of: anxiety and depression  2.  Increase knowledge and/or ability of: coping skills and self-management skills  3.  Demonstrate ability to: Increase healthy adjustment to current life circumstances and Improve medication compliance   Progress of Goals: Ongoing  INTERVENTIONS: Interventions utilized:  Supportive Counseling and Link to [employer] Standardized Assessments completed &  reviewed: Not Needed  Supportive counseling and link to community resources today. Patient now being evicted and will need to leave her apartment in two days. She plans to temporarily get a hotel. Continued in helping patient try to get connected with [facility]) partner for help with disability appeal; had previously referred patient there but they have been unable to connect by phone. Provided patient with resources to search for affordable housing.   Assisted patient in planning task completion for the next couple days. Patient to go to [employer] [facility]) to follow up with caseworker for assistance applying for housing and finding emergency shelter if needed. CSW also already provided patient with emergency shelter information.   Supportive counseling related to coping with this current housing crisis. Validated feelings and the challenging nature of the situation. Provided feedback on patient's strengths and progress through sessions in developing self-management and coping strategies. Patient utilizing mindfulness and breathing exercises learned in previous sessions when feeling anxious.   OUTCOME: Patient Response: Patient exhibits increased healthy adjustment and healthy behavior. Patient reflected on how she more often chooses not to act on or engage with negative thoughts that come up. She spoke of a focus on positive and more helpful thoughts. Also reflected on her faith and how this gives her hope.   Patient may benefit from continued counseling with CBT and mindfulness. Patient may also benefit from continued engagement for support and assistance in navigating systems now that she is unstably housed.   PLAN: 1. Follow up with behavioral health clinician on: 04/15/20 2. Behavioral recommendations: continue using mindfulness and breathing exercises 3. Referral(s): Integrated [employer] (In Clinic) and [employer]:  Housing  I  discussed the assessment and treatment plan with the patient and/or parent/guardian. They were provided an opportunity to ask questions and all were answered. They  agreed with the plan and demonstrated an understanding of the instructions.   They were advised to call back or seek an in-person evaluation as appropriate.  I discussed that the purpose of this visit is to provide behavioral health care while limiting exposure to the novel coronavirus.  Discussed there is a possibility of technology failure and discussed alternative modes of communication if that failure occurs.  Abigail Butts, LCSW [facility] [phone]

## 2020-04-15 ENCOUNTER — Telehealth (HOSPITAL_COMMUNITY): Payer: Medicaid Other | Admitting: Psychiatry

## 2020-04-15 ENCOUNTER — Other Ambulatory Visit: Payer: Self-pay

## 2020-04-17 ENCOUNTER — Telehealth: Payer: Self-pay | Admitting: Clinical

## 2020-04-17 NOTE — Telephone Encounter (Signed)
Integrated Behavioral Health General Follow Up Note  04/17/2020 Name: Theresa Olson MRN: [id number] DOB: 1970/09/07 Theresa Olson is a 49 y.o. year old female who sees Kallie Locks, FNP for primary care. LCSW was initially consulted to support with depression and anxiety.  Interpreter: No.   Interpreter Name & Language: none  Assessment: Patient experiencing mental health challenges, as well as SDOH needs. Patient facing eviction, unstably housed. CSW has made referrals for resources.  Ongoing Intervention: Today CSW called patient to check in. Unable to reach patient earlier this week as planned. Patient temporarily staying with family due to recent eviction. Patient reports she is coping well. Patient has now gotten in touch with [facility] of [facility]) representative regarding disability appeal. Processed another unrelated stressor. Scheduled IBH follow up session for 04/21/20.   Review of patient status, including review of consultants reports, relevant laboratory and other test results, and collaboration with appropriate care team members and the patient's provider was performed as part of comprehensive patient evaluation and provision of services.     Follow up Plan: 1. 04/21/20  Abigail Butts, LCSW [facility] [phone]

## 2020-04-21 ENCOUNTER — Ambulatory Visit: Payer: Medicaid Other | Admitting: Clinical

## 2020-04-21 DIAGNOSIS — F419 Anxiety disorder, unspecified: Secondary | ICD-10-CM

## 2020-04-21 DIAGNOSIS — F332 Major depressive disorder, recurrent severe without psychotic features: Secondary | ICD-10-CM

## 2020-04-21 NOTE — BH Specialist Note (Addendum)
Integrated Behavioral Health General Follow Up Note  04/21/2020 Name: Bryson Palen MRN: [id number] DOB: 10-21-1970 Yulitza Shorts is a 49 y.o. year old female who sees Kallie Locks, FNP for primary care. LCSW was initially consulted to to support with depression and anxiety.  Interpreter: No.   Interpreter Name & Language: none  Assessment: Patient experiencing mental health challenges, as well as SDOH needs. Patient recently evicted and temporarily staying with family. Today and for a few days she is visiting with family out of state.    Ongoing Intervention: Today CSW had brief phone check in with patient and not full session, as patient reported [place] is located out [place]. Patient plans to return over the holiday weekend. When she returns, she plans to temporarily stay with her daughter.   Review of patient status, including review of consultants reports, relevant laboratory and other test results, and collaboration with appropriate care team members and the patient's provider was performed as part of comprehensive patient evaluation and provision of services.     Follow up Plan: 1. IBH session 04/27/20  Abigail Butts, LCSW [facility] [phone]

## 2020-04-27 ENCOUNTER — Ambulatory Visit (INDEPENDENT_AMBULATORY_CARE_PROVIDER_SITE_OTHER): Payer: Medicaid Other | Admitting: Clinical

## 2020-04-27 DIAGNOSIS — F332 Major depressive disorder, recurrent severe without psychotic features: Secondary | ICD-10-CM

## 2020-04-27 DIAGNOSIS — F419 Anxiety disorder, unspecified: Secondary | ICD-10-CM | POA: Diagnosis not present

## 2020-04-27 NOTE — BH Specialist Note (Addendum)
Integrated Behavioral Health Visit via Telemedicine (Telephone)  04/27/2020 Theresa Olson [id number]  Number of Integrated Behavioral Health visits: 15/20 Session Start time: 10:20  Session End time: 11:10 Total time: 50  minutes  Referring Provider: Raliegh Ip, NP Type of Service: Individual Patient or Family location: [address], [place] [facility] Provider location: [facility] All persons participating in visit: patient, CSW   I connected with Theresa Olson and/or [name] self by telephone and verified that I am speaking with the correct person using two identifiers.   Discussed confidentiality: Yes   Confirmed demographics & insurance:  Yes   I discussed that engaging in this virtual visit, they consent to the provision of behavioral healthcare and the services will be billed under their insurance.   Patient and/or legal guardian expressed understanding and consented to virtual visit: Yes   PRESENTING CONCERNS: Patient or family reports the following symptoms/concerns: depression, anxiety Duration of problem: approx. 30 years; Severity of problem: severe  STRENGTHS (Protective Factors/Coping Skills): Physical Health (exercise, healthy diet, medication compliance, etc.)  ASSESSMENT: Patient currently experiencing anxiety, depression, schizophrenia and PTSD.Patient problems exacerbated by family dynamics and living situation.Patient now experiencing housing instability and the recent death of her nephew.   GOALS ADDRESSED: Patient will: 1.  Reduce symptoms of: anxiety and depression  2.  Increase knowledge and/or ability of: coping skills and self-management skills  3.  Demonstrate ability to: Increase healthy adjustment to current life circumstances and Improve medication compliance   Progress of Goals: Ongoing  INTERVENTIONS: Interventions utilized:  Supportive Counseling, Communication Skills and Supportive Reflection Standardized  Assessments completed & reviewed: Not Needed  Patient unstably housed, temporarily staying with her son. Encouraged patient to follow up on community resources previously provided. Supportive counseling around the challenges of her housing situation. Communication skills teaching related to an interpersonal conflict patient is currently dealing with. Reflection on patient's progress with using mindful breathing exercises in times of stress.  OUTCOME: Patient Response: Patient actively engaged in discussion on healthy response to conflict and generated her own adaptive responses. Patient exhibits increased insight into awareness of emotions and managing emotional reactions in an adaptive manner. Patient continues to use mindful breathing exercises at home and reports they are helpful in managing elevated emotions.    PLAN: 1. Follow up with behavioral health clinician on: 05/04/20 2. Behavioral recommendations: continue practicing mindful breathing 3. Referral(s): Integrated [employer] (In Clinic)  I discussed the assessment and treatment plan with the patient and/or parent/guardian. They were provided an opportunity to ask questions and all were answered. They agreed with the plan and demonstrated an understanding of the instructions.   They were advised to call back or seek an in-person evaluation as appropriate.  I discussed that the purpose of this visit is to provide behavioral health care while limiting exposure to the novel coronavirus.  Discussed there is a possibility of technology failure and discussed alternative modes of communication if that failure occurs.  Abigail Butts, LCSW [facility] [phone]

## 2020-04-28 MED ORDER — HYDROXYZINE HCL 10 MG PO TABS: 10.0000 mg | ORAL_TABLET | Freq: Three times a day (TID) | ORAL | 3 refills | Status: DC | PRN

## 2020-04-28 NOTE — Addendum Note (Signed)
Addended by: Eloise Levels on: 04/28/2020 10:19 AM   Modules accepted: Orders

## 2020-05-04 ENCOUNTER — Ambulatory Visit (INDEPENDENT_AMBULATORY_CARE_PROVIDER_SITE_OTHER): Payer: Medicaid Other | Admitting: Clinical

## 2020-05-04 ENCOUNTER — Other Ambulatory Visit: Payer: Self-pay

## 2020-05-04 DIAGNOSIS — F419 Anxiety disorder, unspecified: Secondary | ICD-10-CM

## 2020-05-04 DIAGNOSIS — F332 Major depressive disorder, recurrent severe without psychotic features: Secondary | ICD-10-CM

## 2020-05-04 NOTE — BH Specialist Note (Signed)
Integrated Behavioral Health via Telemedicine Visit  05/04/2020 Theresa Olson [id number]  Number of Integrated Behavioral Health visits: 16/20 Session Start time: 10:00  Session End time: 11:00 Total time: 60  Referring Provider: Raliegh Ip, NP Patient/Family location: [address], [place] [facility] Provider location: [facility] All persons participating in visit: patient, CSW Types of Service: Individual psychotherapy  I connected with Theresa Olson by Telephone and verified that I am speaking with the correct person using two identifiers.    Discussed confidentiality: Yes   I discussed the limitations of telemedicine and the availability of in person appointments.  Discussed there is a possibility of technology failure and discussed alternative modes of communication if that failure occurs.  I discussed that engaging in this telemedicine visit, they consent to the provision of behavioral healthcare and the services will be billed under their insurance.  Patient and/or legal guardian expressed understanding and consented to Telemedicine visit: Yes   Presenting Concerns: Patient and/or family reports the following symptoms/concerns: depression, anxiety Duration of problem: approx. 30 years; Severity of problem: severe  Patient and/or Family's Strengths/Protective Factors: Physical Health (exercise, healthy diet, medication compliance, etc.) and care for others/empathy, religious beliefs  Goals Addressed: Patient will: 1.  Reduce symptoms of: anxiety and depression  2.  Increase knowledge and/or ability of: coping skills and self-management skills  3.  Demonstrate ability to: Increase healthy adjustment to current life circumstances and Improve medication compliance  Progress towards Goals: Ongoing  Interventions: Interventions utilized:  CBT Cognitive Behavioral Therapy and Supportive Counseling Standardized Assessments completed: Not Needed   CBT and  supportive counseling today to address patient's thoughts and emotions about family relationships and dynamics. Exploration of unhelpful thoughts about self in context of family dynamics, particularly how she thinks about relationship with adult son and his current behaviors. Also in how she thinks about her family history. Made plan with patient to try journaling thoughts and feelings about family. Patient engaged in efforts to manage medications; she has started using her pill organizer again.   Patient and/or Family Response: Patient reports continued improvement and benefit from new medication prescribed by psychiatrist. Patient reports improved mood. Patient actively engaged in session today to explore and challenge unhelpful thoughts. She is agreeable to new coping strategy of journaling.   Assessment: Patient currently experiencing anxiety,depression,schizophrenia and PTSD.Patient problems exacerbated by family dynamics and living situation.Patient now experiencing housing instability and the recent death of her nephew.  Patient may benefit from continued therapy including CBT to explore unhelpful thoughts about self in context of family dynamics, mindfulness for coping with elevated emotions, and medication adherence strategies.   Plan: 1. Follow up with behavioral health clinician on: 05/11/20 2. Behavioral recommendations: journaling 3. Referral(s): Integrated [employer] (In Clinic)  I discussed the assessment and treatment plan with the patient and/or parent/guardian. They were provided an opportunity to ask questions and all were answered. They agreed with the plan and demonstrated an understanding of the instructions.   They were advised to call back or seek an in-person evaluation if the symptoms worsen or if the condition fails to improve as anticipated.  Abigail Butts, LCSW  [facility] [phone]

## 2020-05-07 ENCOUNTER — Telehealth (INDEPENDENT_AMBULATORY_CARE_PROVIDER_SITE_OTHER): Payer: Medicaid Other | Admitting: Psychiatry

## 2020-05-07 ENCOUNTER — Encounter (HOSPITAL_COMMUNITY): Payer: Self-pay | Admitting: Psychiatry

## 2020-05-07 ENCOUNTER — Other Ambulatory Visit: Payer: Self-pay

## 2020-05-07 DIAGNOSIS — F4310 Post-traumatic stress disorder, unspecified: Secondary | ICD-10-CM | POA: Diagnosis not present

## 2020-05-07 DIAGNOSIS — F200 Paranoid schizophrenia: Secondary | ICD-10-CM | POA: Diagnosis not present

## 2020-05-07 MED ORDER — ARIPIPRAZOLE 5 MG PO TABS: 5.0000 mg | ORAL_TABLET | Freq: Every day | ORAL | 2 refills | Status: DC
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
## 2020-05-11 ENCOUNTER — Ambulatory Visit (INDEPENDENT_AMBULATORY_CARE_PROVIDER_SITE_OTHER): Payer: Medicaid Other | Admitting: Clinical

## 2020-05-11 ENCOUNTER — Encounter: Payer: Self-pay | Admitting: Clinical

## 2020-05-11 DIAGNOSIS — F419 Anxiety disorder, unspecified: Secondary | ICD-10-CM | POA: Diagnosis not present

## 2020-05-11 DIAGNOSIS — F332 Major depressive disorder, recurrent severe without psychotic features: Secondary | ICD-10-CM
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
## 2020-05-18 ENCOUNTER — Encounter: Payer: Self-pay | Admitting: Clinical

## 2020-05-20 ENCOUNTER — Ambulatory Visit (INDEPENDENT_AMBULATORY_CARE_PROVIDER_SITE_OTHER): Payer: Medicaid Other | Admitting: Clinical

## 2020-05-20 DIAGNOSIS — F332 Major depressive disorder, recurrent severe without psychotic features: Secondary | ICD-10-CM

## 2020-05-20 DIAGNOSIS — F419 Anxiety disorder, unspecified: Secondary | ICD-10-CM | POA: Diagnosis not present

## 2020-05-20 NOTE — BH Specialist Note (Addendum)
Integrated Behavioral Health via Telemedicine Visit  05/20/2020 Theresa Olson [id number]  Number of Integrated Behavioral Health visits: 18/20 Session Start time: 11:50  Session End time: 12:50 Total time: 60  Referring Provider: Raliegh Ip, NP Patient/Family location: [address], [place] [facility] Provider location: [facility] All persons participating in visit: patient, CSW Types of Service: Individual psychotherapy  I connected with Theresa Olson by Telephone and verified that I am speaking with the correct person using two identifiers.    Discussed confidentiality: Yes   I discussed the limitations of telemedicine and the availability of in person appointments.  Discussed there is a possibility of technology failure and discussed alternative modes of communication if that failure occurs.  I discussed that engaging in this telemedicine visit, they consent to the provision of behavioral healthcare and the services will be billed under their insurance.  Patient and/or legal guardian expressed understanding and consented to Telemedicine visit: Yes   Presenting Concerns: Patient and/or family reports the following symptoms/concerns: depression, anxiety Duration of problem: approx 30 years; Severity of problem: severe  Patient and/or Family's Strengths/Protective Factors: Physical Health (exercise, healthy diet, medication compliance, etc.) and care for others/empathy, religious beliefs  Goals Addressed: Patient will: 1.  Reduce symptoms of: anxiety and depression  2.  Increase knowledge and/or ability of: coping skills and self-management skills  3.  Demonstrate ability to: Increase healthy adjustment to current life circumstances  Progress towards Goals: Ongoing  Interventions: Interventions utilized:  Mindfulness or Management consultant and CBT Cognitive Behavioral Therapy Standardized Assessments completed: Not Needed   CBT today to explore and  restructure unhelpful thoughts about self. Discussion on negative messages patient has gotten from family members about herself and how that has impacted her emotions and beliefs about self. Patient to practice cognitive rehearsal and journaling when these negative thoughts come up over the next week. Mindfulness practice today; discussed passage from Just One Thing by Theadore Nan and reviewed how to use a new mindfulness strategy from the passage. Also practiced mindful breathing exercise for use in coping with depressed mood. Discussed incorporating patient's religious beliefs into the practice, such as reciting a verse or prayer that is meaningful to her as a meditation while breathing.   Patient continues to have passing thoughts of hurting herself. Patient denies having a plan or intent to harm herself. She utilizes coping skills learned in sessions such as mindful breathing when having these thoughts. Patient confirmed she still has the crisis numbers provided by CSW in previous session. She continues to take medication prescribed by psychiatry.   Patient and/or Family Response: Patient actively engaged in exploration of unhelpful thoughts and agreeable to continue journaling. Patient will incorporate the two new coping skills learned today over the next week.   Assessment: Patient currently experiencing anxiety,depression,schizophrenia and PTSD.Patient reports increase in depressed mood today, exacerbated by conflict with her sister.   Patient may benefit from continued therapy including CBT to explore unhelpful thoughts about self in context of family dynamics, mindfulness for coping with elevated emotions, and medication adherence strategies.   Plan: 1. Follow up with behavioral health clinician on: 05/26/20 2. Referral(s): Integrated [employer] (In Clinic)  I discussed the assessment and treatment plan with the patient and/or parent/guardian. They were provided an  opportunity to ask questions and all were answered. They agreed with the plan and demonstrated an understanding of the instructions.   They were advised to call back or seek an in-person evaluation if the symptoms worsen or  if the condition fails to improve as anticipated.  Abigail Butts, LCSW  [facility] [phone]

## 2020-05-26 ENCOUNTER — Ambulatory Visit (INDEPENDENT_AMBULATORY_CARE_PROVIDER_SITE_OTHER): Payer: Medicaid Other | Admitting: Clinical

## 2020-05-26 DIAGNOSIS — F332 Major depressive disorder, recurrent severe without psychotic features: Secondary | ICD-10-CM

## 2020-05-26 DIAGNOSIS — F419 Anxiety disorder, unspecified: Secondary | ICD-10-CM | POA: Diagnosis not present

## 2020-05-26 NOTE — BH Specialist Note (Addendum)
Integrated Behavioral Health via Telemedicine Visit  05/26/2020 Theresa Olson [id number]  Number of Integrated Behavioral Health visits: 19/20 Session Start time: 1:35  Session End time: 2:35 Total time: 60  Referring Provider: Raliegh Ip, NP Patient/Family location: [address], [place] [facility] Provider location: [facility] All persons participating in visit: patient, CSW Types of Service: Telephone visit  I connected with Theresa Olson by Telephone and verified that I am speaking with the correct person using two identifiers.Discussed confidentiality: Yes   I discussed the limitations of telemedicine and the availability of in person appointments.  Discussed there is a possibility of technology failure and discussed alternative modes of communication if that failure occurs.  I discussed that engaging in this telemedicine visit, they consent to the provision of behavioral healthcare and the services will be billed under their insurance.  Patient and/or legal guardian expressed understanding and consented to Telemedicine visit: Yes   Presenting Concerns: Patient and/or family reports the following symptoms/concerns: depression, anxiety Duration of problem: approx 30 years; Severity of problem: severe  Patient and/or Family's Strengths/Protective Factors: Physical Health (exercise, healthy diet, medication compliance, etc.) and empathy, religious beliefs  Goals Addressed: Patient will: 1.  Increase knowledge and/or ability of: coping skills and self-management skills  2.  Demonstrate ability to: Increase healthy adjustment to current life circumstances  Progress towards Goals: Ongoing  Interventions: Interventions utilized:  CBT Cognitive Behavioral Therapy Standardized Assessments completed: Not Needed   CBT today to explore and modify unhelpful thoughts which have developed from negative messages patient has heard from family members over the years.  Cognitive rehearsal of more positive and helpful thoughts. Planned application of new coping skills and adaptive behaviors to use in emotionally elevated interactions with her sister. Patient reports in the past when she feels negative emotions when talking to her sister, they fight, verbally or physically; patient wants this to change. Review of assertive communication, declining to engage in negative communication, using mindful breathing techniques.   Patient and/or Family Response: Patient engaged in session. Patient reports increased sense of "peace," as she describes it, in the past week. Patient exhibits increased acceptance of inability to change others' behavior and has rather focused on shifting her thoughts to more positive things and modifying her own behaviors in a healthy way. Patient continues to use mindful breathing practices to cope when emotionally elevated.  Assessment: Patient currently experiencing anxiety,depression,schizophrenia and PTSD. Patient with improved mood this week compared to last.   Patient may benefit from continued therapy including CBT to explore unhelpful thoughts about self in context of family dynamics, and mindfulness for coping with elevated emotions.  Plan: 1. Follow up with behavioral health clinician on: 06/03/19 2. Behavioral recommendations: journaling, cognitive rehearsal 3. Referral(s): Integrated [employer] (In Clinic)  I discussed the assessment and treatment plan with the patient and/or parent/guardian. They were provided an opportunity to ask questions and all were answered. They agreed with the plan and demonstrated an understanding of the instructions.   They were advised to call back or seek an in-person evaluation if the symptoms worsen or if the condition fails to improve as anticipated.  Abigail Butts, LCSW  [facility] [phone]

## 2020-06-02 ENCOUNTER — Encounter: Payer: Self-pay | Admitting: Clinical

## 2020-06-05 ENCOUNTER — Ambulatory Visit (INDEPENDENT_AMBULATORY_CARE_PROVIDER_SITE_OTHER): Payer: Medicaid Other | Admitting: Clinical

## 2020-06-05 DIAGNOSIS — F419 Anxiety disorder, unspecified: Secondary | ICD-10-CM

## 2020-06-05 DIAGNOSIS — F332 Major depressive disorder, recurrent severe without psychotic features: Secondary | ICD-10-CM

## 2020-06-05 NOTE — BH Specialist Note (Addendum)
Integrated Behavioral Health via Telemedicine Visit  06/05/2020 Theresa Olson [id number]  Number of Integrated Behavioral Health visits: 20/40 Session Start time: 1:00  Session End time: 2:00 Total time: 60  Referring Provider: Raliegh Ip, NP Patient/Family location: [address], [place] [facility] Provider location: [facility] All persons participating in visit: CSW, patient Types of Service: Telephone visit  I connected with Theresa Olson by Telephone  (Video is Caregility application) and verified that I am speaking with the correct person using two identifiers.Discussed confidentiality: Yes   I discussed the limitations of telemedicine and the availability of in person appointments.  Discussed there is a possibility of technology failure and discussed alternative modes of communication if that failure occurs.  I discussed that engaging in this telemedicine visit, they consent to the provision of behavioral healthcare and the services will be billed under their insurance.  Patient and/or legal guardian expressed understanding and consented to Telemedicine visit: Yes   Presenting Concerns: Patient and/or family reports the following symptoms/concerns: depression and anxiety Duration of problem: approx 30 years; Severity of problem: severe  Patient and/or Family's Strengths/Protective Factors: Physical Health (exercise, healthy diet, medication compliance, etc.) and empathy, religious beliefs  Goals Addressed: Patient will: 1.  Reduce symptoms of: depression  2.  Increase knowledge and/or ability of: coping skills and self-management skills  3.  Demonstrate ability to: Increase healthy adjustment to current life circumstances  Progress towards Goals: Ongoing  Interventions: Interventions utilized:  CBT Cognitive Behavioral Therapy and Communication Skills Standardized Assessments completed: Not Needed  CBT today to explore and modify unhelpful thoughts  about self. Patient having negative thoughts about self-worth since she is not working. Explored idea of self-compassion. Cognitive rehearsal to practice more helpful thoughts about self.   Patient and/or Family Response: Patient engaged in session. Patient actively participated in cognitive rehearsal. Patient continues to journal about her feelings and to stay organized with tasks she needs to do. Patient continues to practice mindful breathing to cope when emotionally elevated.   Assessment: Patient currently experiencing anxiety,depression,schizophrenia and PTSD. Patient experiencing negative thoughts about self in context of these challenges.    Patient may benefit from continued therapy including CBT to explore unhelpful thoughts about self in context of family dynamics, and mindfulness for coping with elevated emotions.  Plan: 1. Follow up with behavioral health clinician on: 06/08/20 to coordinate transportation to PCP visit, then will schedule next IBH session 2. Referral(s): Integrated [employer] (In Clinic)  I discussed the assessment and treatment plan with the patient and/or parent/guardian. They were provided an opportunity to ask questions and all were answered. They agreed with the plan and demonstrated an understanding of the instructions.   They were advised to call back or seek an in-person evaluation if the symptoms worsen or if the condition fails to improve as anticipated.  Abigail Butts, LCSW  [facility] [phone]

## 2020-06-08 ENCOUNTER — Telehealth: Payer: Self-pay | Admitting: Clinical

## 2020-06-08 NOTE — Telephone Encounter (Signed)
Integrated Behavioral Health General Follow Up Note  06/08/2020 Name: Theresa Olson MRN: [id number] DOB: 02-Sep-1970 Theresa Olson is a 50 y.o. year old female who sees Kallie Locks, FNP for primary care. LCSW was initially consulted to support with depression and anxiety.  Interpreter: No.   Interpreter Name & Language: none  Assessment: Patient experiencing some symptoms of COVID and reports others in her household have tested positive.   Ongoing Intervention: Patient called CSW and requested assistance rescheduling her upcoming appointment with PCP because of this. Consulted with PCP and Research officer, political party; got patient scheduled for visit with [facility] 06/10/20. CSW to assist with transportation if needed. CSW and patient to have tele-health IBH follow up visit 06/11/20.  Review of patient status, including review of consultants reports, relevant laboratory and other test results, and collaboration with appropriate care team members and the patient's provider was performed as part of comprehensive patient evaluation and provision of services.     Follow up Plan: 1. IBH follow up 06/11/20  Abigail Butts, LCSW [facility] [phone]

## 2020-06-09 ENCOUNTER — Ambulatory Visit: Payer: Self-pay | Admitting: Family Medicine

## 2020-06-10 ENCOUNTER — Ambulatory Visit: Payer: Medicaid Other

## 2020-06-11 ENCOUNTER — Ambulatory Visit (INDEPENDENT_AMBULATORY_CARE_PROVIDER_SITE_OTHER): Payer: Medicaid Other | Admitting: Clinical

## 2020-06-11 DIAGNOSIS — F332 Major depressive disorder, recurrent severe without psychotic features: Secondary | ICD-10-CM

## 2020-06-11 DIAGNOSIS — F419 Anxiety disorder, unspecified: Secondary | ICD-10-CM

## 2020-06-11 NOTE — BH Specialist Note (Addendum)
Integrated Behavioral Health via Telemedicine Visit  06/11/2020 Quantavia Frith [id number]  Number of Integrated Behavioral Health visits: 21/40 Session Start time: 2:00  Session End time: 3:00 Total time: 60  Referring Provider: Raliegh Ip, NP Patient/Family location: [address], [place] [facility] Provider location: [facility] All persons participating in visit: patient, CSW Types of Service: Telephone visit  I connected with [name] by Telephone and verified that I am speaking with the correct person using two identifiers.Discussed confidentiality: Yes   I discussed the limitations of telemedicine and the availability of in person appointments.  Discussed there is a possibility of technology failure and discussed alternative modes of communication if that failure occurs.  I discussed that engaging in this telemedicine visit, they consent to the provision of behavioral healthcare and the services will be billed under their insurance.  Patient and/or legal guardian expressed understanding and consented to Telemedicine visit: Yes   Presenting Concerns: Patient and/or family reports the following symptoms/concerns: depression and anxiety Duration of problem: approx 30 years; Severity of problem: severe  Patient and/or Family's Strengths/Protective Factors: Physical Health (exercise, healthy diet, medication compliance, etc.) and empathy, religious beliefs  Goals Addressed: Patient will: 1.  Reduce symptoms of: anxiety and depression  2.  Increase knowledge and/or ability of: coping skills and self-management skills  3.  Demonstrate ability to: Increase healthy adjustment to current life circumstances  Progress towards Goals: Ongoing  Interventions: Interventions utilized:  Mindfulness or Management consultant and CBT Cognitive Behavioral Therapy Standardized Assessments completed: Not Needed   CBT today to explore value of shifting negative thoughts  to more positive and helpful thoughts, as well as intentionally focusing on positive and enjoyable experiences, rather than negative ones. Mindful practice of leaning into a positive experience patient had this past week.    Patient and/or Family Response: Patient reported improvement in mood this week, stating she feels happier. Patient engaged in session. Patient reports she has continue to use journaling as a tool for self-management, writing down positive reflections.   Assessment: Patient currently experiencing anxiety,depression,schizophrenia and PTSD.Patient experiencing negative thoughts about self in context of these challenges. Patient reports improved mood this past week.   Patient may benefit from continued therapy including CBT to explore unhelpful thoughts about self in context of family dynamics,andmindfulness for coping with elevated emotions.  Plan: 1. Follow up with behavioral health clinician on: 06/17/20 2. Behavioral recommendations: journaling 3. Referral(s): Integrated [employer] (In Clinic)  I discussed the assessment and treatment plan with the patient and/or parent/guardian. They were provided an opportunity to ask questions and all were answered. They agreed with the plan and demonstrated an understanding of the instructions.   They were advised to call back or seek an in-person evaluation if the symptoms worsen or if the condition fails to improve as anticipated.  Abigail Butts, LCSW  [facility] [phone]

## 2020-06-17 ENCOUNTER — Other Ambulatory Visit: Payer: Self-pay | Admitting: Family Medicine

## 2020-06-17 ENCOUNTER — Ambulatory Visit (INDEPENDENT_AMBULATORY_CARE_PROVIDER_SITE_OTHER): Payer: Medicaid Other | Admitting: Clinical

## 2020-06-17 DIAGNOSIS — F419 Anxiety disorder, unspecified: Secondary | ICD-10-CM

## 2020-06-17 DIAGNOSIS — F332 Major depressive disorder, recurrent severe without psychotic features: Secondary | ICD-10-CM

## 2020-06-17 DIAGNOSIS — Z1231 Encounter for screening mammogram for malignant neoplasm of breast: Secondary | ICD-10-CM

## 2020-06-17 NOTE — BH Specialist Note (Addendum)
Integrated Behavioral Health via Telemedicine Visit  06/17/2020 Theresa Olson [id number]  Number of Integrated Behavioral Health visits: 22/40 Session Start time: 3:15  Session End time: 4:15 Total time: 60  Referring Provider: Raliegh Ip, NP Patient/Family location: [address] [place] [facility] Provider location: [facility] All persons participating in visit: patient, CSW Types of Service: Telephone visit  I connected with Theresa Olson by Telephone and verified that I am speaking with the correct person using two identifiers.Discussed confidentiality: Yes   I discussed the limitations of telemedicine and the availability of in person appointments.  Discussed there is a possibility of technology failure and discussed alternative modes of communication if that failure occurs.  I discussed that engaging in this telemedicine visit, they consent to the provision of behavioral healthcare and the services will be billed under their insurance.  Patient and/or legal guardian expressed understanding and consented to Telemedicine visit: Yes   Presenting Concerns: Patient and/or family reports the following symptoms/concerns: depression and anxiety Duration of problem: approx 30 years; Severity of problem: severe  Patient and/or Family's Strengths/Protective Factors: Physical Health (exercise, healthy diet, medication compliance, etc.) and empathy, religious beliefs  Goals Addressed: Patient will: 1.  Reduce symptoms of: anxiety and depression  2.  Increase knowledge and/or ability of: coping skills and self-management skills  3.  Demonstrate ability to: Increase healthy adjustment to current life circumstances  Progress towards Goals: Ongoing  Interventions: Interventions utilized:  CBT Cognitive Behavioral Therapy and Supportive Counseling Standardized Assessments completed: Not Needed   Patient feeling that she doesn't have much to do during the day  which negatively impacts her mood. Supportive counseling to explore increasing and scheduling activity to give patient more structure.    CBT to explore feared activity, which is taking a walk. Challenged unhelpful thoughts patient has about taking a walk. Validated reasons this is a feared activity due to patient's past traumas. Explored ways patient can feel more safe when trying this activity. Exposure therapy plan to address this fear.    Patient and/or Family Response: Patient engaged in session. Patient continues to use coping skills learned in previous sessions.   Assessment: Patient currently experiencing anxiety,depression,schizophrenia and PTSD.Patient experiencing negative thoughts about self in context of these challenges.  Patient may benefit from continued therapy including CBT to explore unhelpful thoughts about self in context of family dynamics,andmindfulness for coping with elevated emotions.  Plan: 1. Follow up with behavioral health clinician on: 06/24/20 2. Behavioral recommendations: activity scheduling, practice exposure to feared activity 3. Referral(s): Integrated [employer] (In Clinic)  I discussed the assessment and treatment plan with the patient and/or parent/guardian. They were provided an opportunity to ask questions and all were answered. They agreed with the plan and demonstrated an understanding of the instructions.   They were advised to call back or seek an in-person evaluation if the symptoms worsen or if the condition fails to improve as anticipated.  Theresa Butts, LCSW  [facility] [phone]

## 2020-06-24 ENCOUNTER — Ambulatory Visit (INDEPENDENT_AMBULATORY_CARE_PROVIDER_SITE_OTHER): Payer: Medicaid Other | Admitting: Clinical

## 2020-06-24 DIAGNOSIS — F419 Anxiety disorder, unspecified: Secondary | ICD-10-CM

## 2020-06-24 DIAGNOSIS — F332 Major depressive disorder, recurrent severe without psychotic features: Secondary | ICD-10-CM

## 2020-06-24 NOTE — BH Specialist Note (Addendum)
Integrated Behavioral Health via Telemedicine Visit  06/24/2020 Theresa Olson [id number]  Number of Integrated Behavioral Health visits: 23/40 Session Start time: 1:15  Session End time: 1:55 Total time: 40   Referring Provider: Raliegh Ip, NP Patient/Family location: [address], [place] (daughter's home) [facility] Provider location: [facility] All persons participating in visit: patient, CSW Types of Service: Telephone visit  I connected with Theresa Olson by Telephone and verified that I am speaking with the correct person using two identifiers.Discussed confidentiality: Yes   I discussed the limitations of telemedicine and the availability of in person appointments.  Discussed there is a possibility of technology failure and discussed alternative modes of communication if that failure occurs.  I discussed that engaging in this telemedicine visit, they consent to the provision of behavioral healthcare and the services will be billed under their insurance.  Patient and/or legal guardian expressed understanding and consented to Telemedicine visit: Yes   Presenting Concerns: Patient and/or family reports the following symptoms/concerns: depression and anxiety Duration of problem: approx 30 years; Severity of problem: severe  Patient and/or Family's Strengths/Protective Factors: Physical Health (exercise, healthy diet, medication compliance, etc.) and empathy, religious beliefs  Goals Addressed: Patient will: 1.  Reduce symptoms of: anxiety and depression  2.  Increase knowledge and/or ability of: coping skills and self-management skills  3.  Demonstrate ability to: Increase healthy adjustment to current life circumstances  Progress towards Goals: Ongoing  Interventions: Interventions utilized:  CBT Cognitive Behavioral Therapy and Supportive Counseling Standardized Assessments completed: Not Needed   CBT today to review activity scheduling  and exposure therapy plans discussed in last session. Patient reported she did not use activity scheduling due to her daughter whom she is staying with being sick; patient was helping around the house and helping care for her grandchildren. Patient did try some exposure steps, in service of plan to be able to take a walk, which raises her anxiety level. She took her daughter's dog outside and around the apartment building; she reported her anxiety remained high during this activity. Acknowledged patient's success with trying this step.   Patient and/or Family Response: Patient engaged in session. Patient continues to use coping skills learned in previous sessions. Patient reported feeling a high level of anxiety on yesterday and considered calling CSW for support. Patient decided to use her mindful breathing exercises to help bring down anxiety level at that time. Today CSW provided reinforcement of the use of these coping skills and supportive reflection on progress patient has made in managing anxiety.   Assessment: Patient currently experiencing anxiety,depression,schizophrenia and PTSD.Patient experiencing negative thoughts about self in context of these challenges.  Patient may benefit from continued therapy including CBT to explore unhelpful thoughts about self in context of family dynamics,andmindfulness for coping with elevated emotions.  Plan: 1. Follow up with behavioral health clinician on: 07/01/20 2. Referral(s): Integrated [employer] (In Clinic)  I discussed the assessment and treatment plan with the patient and/or parent/guardian. They were provided an opportunity to ask questions and all were answered. They agreed with the plan and demonstrated an understanding of the instructions.   They were advised to call back or seek an in-person evaluation if the symptoms worsen or if the condition fails to improve as anticipated.  Abigail Butts, LCSW  Patient [facility] [phone]

## 2020-06-26 ENCOUNTER — Ambulatory Visit: Payer: Self-pay | Admitting: Family Medicine

## 2020-06-26 ENCOUNTER — Ambulatory Visit: Payer: Medicaid Other

## 2020-07-01 ENCOUNTER — Ambulatory Visit (INDEPENDENT_AMBULATORY_CARE_PROVIDER_SITE_OTHER): Payer: Medicaid Other | Admitting: Clinical

## 2020-07-01 DIAGNOSIS — F332 Major depressive disorder, recurrent severe without psychotic features: Secondary | ICD-10-CM

## 2020-07-01 DIAGNOSIS — F419 Anxiety disorder, unspecified: Secondary | ICD-10-CM | POA: Diagnosis not present

## 2020-07-01 NOTE — BH Specialist Note (Addendum)
Integrated Behavioral Health via Telemedicine Visit  07/01/2020 Theresa Olson [id number]  Number of Integrated Behavioral Health visits: 24/40 Session Start time: 2:30  Session End time: 3:00 Total time: 30  Referring Provider: Raliegh Ip, NP Patient/Family location: [name], [place] (daughter's home) [facility] Provider location: [facility] All persons participating in visit: CSW, patient Types of Service: Telephone visit  I connected with Theresa Olson by Telephone and verified that I am speaking with the correct person using two identifiers.Discussed confidentiality: Yes   I discussed the limitations of telemedicine and the availability of in person appointments.  Discussed there is a possibility of technology failure and discussed alternative modes of communication if that failure occurs.  I discussed that engaging in this telemedicine visit, they consent to the provision of behavioral healthcare and the services will be billed under their insurance.  Patient and/or legal guardian expressed understanding and consented to Telemedicine visit: Yes   Presenting Concerns: Patient and/or family reports the following symptoms/concerns: depression and anxiety Duration of problem: approx 30 years; Severity of problem: severe  Patient and/or Family's Strengths/Protective Factors: Physical Health (exercise, healthy diet, medication compliance, etc.) and empathy, religious beliefs  Goals Addressed: Patient will: 1.  Increase knowledge and/or ability of: coping skills and self-management skills  2.  Demonstrate ability to: Increase healthy adjustment to current life circumstances  Progress towards Goals: Ongoing  Interventions: Interventions utilized:  Mindfulness or Management consultant and CBT Cognitive Behavioral Therapy Standardized Assessments completed: Not Needed   CBT today to review progress with exposure therapy plan; patient would like  to take walks for her health but being outside/around others raises her anxiety significantly. This week patient took a walk around her daughter's apartment building. She reports her anxiety increased to a 10/10 during the walk. CBT to review evidence related to patient's fears associated with being outside and to celebrate the accomplishment of taking a short walk.   Practiced new mindful breathing exercise. Intention of the exercise is to decrease anxiety and produce a calm feeling. Patient participated in the exercise and plans to use it along with other mindful exercises learned in past sessions.  Patient and/or Family Response: Patient engaged in session. Patient exhibits efforts outside of session to participate in exposure to a feared activity in service of reaching her goals.  Assessment: Patient currently experiencing anxiety,depression,schizophrenia and PTSD.Patient experiencing negative thoughts about self in context of these challenges.  Patient may benefit from continued therapy including CBT to explore unhelpful thoughts about self in context of family dynamics,andmindfulness for coping with elevated emotions.  Plan: 1. Follow up with behavioral health clinician on: 07/08/20 2. Referral(s): Integrated [employer] (In Clinic)  I discussed the assessment and treatment plan with the patient and/or parent/guardian. They were provided an opportunity to ask questions and all were answered. They agreed with the plan and demonstrated an understanding of the instructions.   They were advised to call back or seek an in-person evaluation if the symptoms worsen or if the condition fails to improve as anticipated.  Abigail Butts, LCSW  [facility] [phone]

## 2020-07-08 ENCOUNTER — Ambulatory Visit (INDEPENDENT_AMBULATORY_CARE_PROVIDER_SITE_OTHER): Payer: Medicaid Other | Admitting: Clinical

## 2020-07-08 DIAGNOSIS — F332 Major depressive disorder, recurrent severe without psychotic features: Secondary | ICD-10-CM

## 2020-07-08 DIAGNOSIS — F419 Anxiety disorder, unspecified: Secondary | ICD-10-CM

## 2020-07-08 NOTE — BH Specialist Note (Addendum)
Integrated Behavioral Health via Telemedicine Visit  07/08/2020 Elton Catalano [id number]  Number of Integrated Behavioral Health visits: 25/40 Session Start time: 3:05  Session End time: 4:00 Total time: 55   Referring Provider: Raliegh Ip, NP Patient/Family location: [name], [place] (daughter's home) [facility] Provider location: [facility] All persons participating in visit:  CSW, patient Types of Service: Telephone visit  I connected with Theresa Olson by Telephone and verified that I am speaking with the correct person using two identifiers.Discussed confidentiality: Yes   I discussed the limitations of telemedicine and the availability of in person appointments.  Discussed there is a possibility of technology failure and discussed alternative modes of communication if that failure occurs.  I discussed that engaging in this telemedicine visit, they consent to the provision of behavioral healthcare and the services will be billed under their insurance.  Patient and/or legal guardian expressed understanding and consented to Telemedicine visit: Yes   Presenting Concerns: Patient and/or family reports the following symptoms/concerns: depression and anxiety Duration of problem: approx 30 years; Severity of problem: severe  Patient and/or Family's Strengths/Protective Factors: Physical Health (exercise, healthy diet, medication compliance, etc.) and empathy, religious beliefs  Goals Addressed: Patient will: 1.  Reduce symptoms of: anxiety and depression  2.  Increase knowledge and/or ability of: coping skills and self-management skills  3.  Demonstrate ability to: Increase healthy adjustment to current life circumstances  Progress towards Goals: Ongoing  Interventions: Interventions utilized:  CBT Cognitive Behavioral Therapy and Supportive Counseling Standardized Assessments completed: Not Needed   Patient reported decrease in mood this  past week. CBT today to explore root of some recent unhelpful thoughts; patient identified negative messaging from her sister. Explored this negative messaging and applied techniques from previous sessions to challenge and re-frame this. Patient identified more positive messaging and recorded some in her journal during session. Patient to review and rehearse these throughout the week.   Called [facility] and confirmed patient's next psychiatry appointment. Patient had questions about her medication dosage. Advised patient to discuss with psychiatry at next appointment.   Patient and/or Family Response: Patient engaged in session.  Assessment: Patient currently experiencing anxiety,depression,schizophrenia and PTSD.Patient experiencing negative thoughts about self in context of these challenges.  Patient may benefit from continued therapy including CBT to explore unhelpful thoughts about self in context of family dynamics,andmindfulness for coping with elevated emotions.  Plan: 1. Follow up with behavioral health clinician on: 07/15/20 2. Behavioral recommendations: journaling, continue using mindful breathing exercises 3. Referral(s): Integrated [employer] (In Clinic)  I discussed the assessment and treatment plan with the patient and/or parent/guardian. They were provided an opportunity to ask questions and all were answered. They agreed with the plan and demonstrated an understanding of the instructions.   They were advised to call back or seek an in-person evaluation if the symptoms worsen or if the condition fails to improve as anticipated.  Abigail Butts, LCSW  [facility] [phone]

## 2020-07-15 ENCOUNTER — Ambulatory Visit (INDEPENDENT_AMBULATORY_CARE_PROVIDER_SITE_OTHER): Payer: Medicaid Other | Admitting: Clinical

## 2020-07-15 DIAGNOSIS — F332 Major depressive disorder, recurrent severe without psychotic features: Secondary | ICD-10-CM

## 2020-07-15 DIAGNOSIS — F419 Anxiety disorder, unspecified: Secondary | ICD-10-CM

## 2020-07-15 NOTE — BH Specialist Note (Signed)
Integrated Behavioral Health via Telemedicine Visit  07/15/2020 Theresa Olson [id number]  Number of Integrated Behavioral Health visits: 26/40 Session Start time: 2:20  Session End time: 3:10 Total time: 50   Referring Provider: Raliegh Ip, NP Patient/Family location: [address] [place], [place] (daughter's home) [facility] Provider location: [facility] All persons participating in visit: CSW, patient Types of Service: Telephone visit  I connected with Theresa Olson by Telephone and verified that I am speaking with the correct person using two identifiers.Discussed confidentiality: Yes   I discussed the limitations of telemedicine and the availability of in person appointments.  Discussed there is a possibility of technology failure and discussed alternative modes of communication if that failure occurs.  I discussed that engaging in this telemedicine visit, they consent to the provision of behavioral healthcare and the services will be billed under their insurance.  Patient and/or legal guardian expressed understanding and consented to Telemedicine visit: Yes   Presenting Concerns: Patient and/or family reports the following symptoms/concerns: depression and anxiety Duration of problem: approx 30 years; Severity of problem: severe  Patient and/or Family's Strengths/Protective Factors: Physical Health (exercise, healthy diet, medication compliance, etc.) and empathy, religious beliefs  Goals Addressed: Patient will: 1.  Reduce symptoms of: anxiety and depression  2.  Increase knowledge and/or ability of: coping skills and self-management skills  3.  Demonstrate ability to: Increase healthy adjustment to current life circumstances  Progress towards Goals: Ongoing  Interventions: Interventions utilized:  CBT Cognitive Behavioral Therapy Standardized Assessments completed: Not Needed   CBT today to process patient's thoughts about issues with her  brother and with her son. Validation and supportive reflection of patient's emotional experience. Patient exhibits increase in adaptive behaviors and reactions when in interpersonal situations that make her feel emotionally elevated. Patient exhibits increased effort to avoid engagement in conflict with her sister. Patient continues to use coping skills learned in sessions, particularly mindful breathing techniques. Some time spent on exposure therapy plan in service of patient's goal to take walks outside. Patient plans to listen to calming music while walking outside.    Patient and/or Family Response: Patient engaged in session.  Assessment: Patient currently experiencing anxiety,depression,schizophrenia and PTSD.Patient experiencing negative thoughts about self in context of these challenges.  Patient may benefit from continued therapy including CBT to explore unhelpful thoughts about self in context of family dynamics,andmindfulness for coping with elevated emotions.  Plan: 1. Follow up with behavioral health clinician on: 07/22/20 2. Behavioral recommendations: exposure therapy plan for taking walks outside 3. Referral(s): Integrated [employer] (In Clinic)  I discussed the assessment and treatment plan with the patient and/or parent/guardian. They were provided an opportunity to ask questions and all were answered. They agreed with the plan and demonstrated an understanding of the instructions.   They were advised to call back or seek an in-person evaluation if the symptoms worsen or if the condition fails to improve as anticipated.  Abigail Butts, LCSW  [facility] [phone]

## 2020-07-22 ENCOUNTER — Encounter: Payer: Self-pay | Admitting: Clinical

## 2020-07-23 ENCOUNTER — Ambulatory Visit (INDEPENDENT_AMBULATORY_CARE_PROVIDER_SITE_OTHER): Payer: Medicaid Other | Admitting: Clinical

## 2020-07-23 DIAGNOSIS — F4310 Post-traumatic stress disorder, unspecified: Secondary | ICD-10-CM

## 2020-07-23 DIAGNOSIS — F419 Anxiety disorder, unspecified: Secondary | ICD-10-CM | POA: Diagnosis not present

## 2020-07-23 DIAGNOSIS — F332 Major depressive disorder, recurrent severe without psychotic features: Secondary | ICD-10-CM

## 2020-07-23 NOTE — BH Specialist Note (Addendum)
Integrated Behavioral Health via Telemedicine Visit  07/23/2020 Brizeida Mcmurry [id number]  Number of Integrated Behavioral Health visits: 27/40 Session Start time: 11:35  Session End time: 12:20 Total time: 45   Referring Provider: Raliegh Ip, NP Patient/Family location: [name], [place] (daughter's home) [facility] Provider location: [facility] All persons participating in visit: CSW, patient Types of Service: Telephone visit  I connected with [name] by Telephone and verified that I am speaking with the correct person using two identifiers.Discussed confidentiality: Yes   I discussed the limitations of telemedicine and the availability of in person appointments.  Discussed there is a possibility of technology failure and discussed alternative modes of communication if that failure occurs.  I discussed that engaging in this telemedicine visit, they consent to the provision of behavioral healthcare and the services will be billed under their insurance.  Patient and/or legal guardian expressed understanding and consented to Telemedicine visit: Yes   Presenting Concerns: Patient and/or family reports the following symptoms/concerns: depression and anxiety Duration of problem: approx 30 years; Severity of problem: severe  Patient and/or Family's Strengths/Protective Factors: Physical Health (exercise, healthy diet, medication compliance, etc.) and empathy, religious beliefs  Goals Addressed: Patient will: 1.  Reduce symptoms of: anxiety and depression  2.  Increase knowledge and/or ability of: coping skills and self-management skills  3.  Demonstrate ability to: Increase healthy adjustment to current life circumstances  Progress towards Goals: Ongoing  Interventions: Interventions utilized:  CBT Cognitive Behavioral Therapy, Psychoeducation and/or Health Education and Link to [employer] Standardized Assessments completed: Not  Needed   CBT today to process patient's thoughts about issues with her brother and with her son. Validation and supportive reflection of patient's emotional experience. Review of restructured unhelpful thoughts about self in context of problems with son. Some time spent on exposure therapy plan in service of patient's goal to take walks outside. Patient plans to listen to calming music while walking outside. Patient has also increased physical activity and is exercising on the treadmill at least 15 minutes per day. Psycho-education on impact of physical activity on mood.  Also link to community resources today. Had previously referred patient to [employer] [place] [facility]) for assistance with disability appeal. Patient has been unable to follow up with them recently. Assisted patient in calling [facility] attorney and they scheduled a follow up appointment.     Patient and/or Family Response: Patient engaged in session. Patient exhibits increase in adaptive behaviors and reactions when in interpersonal situations that make her feel emotionally elevated.  Assessment: Patient currently experiencing anxiety,depression,schizophrenia and PTSD.Patient experiencing negative thoughts about self in context of these challenges.  Patient may benefit from continued therapy including CBT to explore unhelpful thoughts about self in context of family dynamics,andmindfulness for coping with elevated emotions.  Plan: 1. Follow up with behavioral health clinician on: 07/29/20 2. Behavioral recommendations: exposure therapy plan for taking walks outside 3. Referral(s): Integrated [employer] (In Clinic)  I discussed the assessment and treatment plan with the patient and/or parent/guardian. They were provided an opportunity to ask questions and all were answered. They agreed with the plan and demonstrated an understanding of the instructions.   They were advised to call back or seek an in-person  evaluation if the symptoms worsen or if the condition fails to improve as anticipated.  Abigail Butts, LCSW  [facility] [phone]

## 2020-07-29 ENCOUNTER — Encounter: Payer: Self-pay | Admitting: Clinical

## 2020-07-30 ENCOUNTER — Encounter (HOSPITAL_COMMUNITY): Payer: Self-pay | Admitting: Psychiatry

## 2020-07-30 ENCOUNTER — Ambulatory Visit (INDEPENDENT_AMBULATORY_CARE_PROVIDER_SITE_OTHER): Payer: Medicaid Other | Admitting: Clinical

## 2020-07-30 ENCOUNTER — Other Ambulatory Visit: Payer: Self-pay

## 2020-07-30 ENCOUNTER — Telehealth (INDEPENDENT_AMBULATORY_CARE_PROVIDER_SITE_OTHER): Payer: Medicaid Other | Admitting: Psychiatry

## 2020-07-30 VITALS — Wt 187.0 lb

## 2020-07-30 DIAGNOSIS — F339 Major depressive disorder, recurrent, unspecified: Secondary | ICD-10-CM

## 2020-07-30 DIAGNOSIS — F419 Anxiety disorder, unspecified: Secondary | ICD-10-CM

## 2020-07-30 DIAGNOSIS — F4310 Post-traumatic stress disorder, unspecified: Secondary | ICD-10-CM | POA: Diagnosis not present

## 2020-07-30 DIAGNOSIS — F200 Paranoid schizophrenia: Secondary | ICD-10-CM | POA: Diagnosis not present

## 2020-07-30 MED ORDER — DULOXETINE HCL 30 MG PO CPEP: ORAL_CAPSULE | ORAL | 0 refills | Status: DC

## 2020-07-30 MED ORDER — HYDROXYZINE HCL 25 MG PO TABS: 25.0000 mg | ORAL_TABLET | Freq: Three times a day (TID) | ORAL | 0 refills | Status: DC | PRN

## 2020-07-30 MED ORDER — ARIPIPRAZOLE 15 MG PO TABS: 7.5000 mg | ORAL_TABLET | Freq: Every day | ORAL | 0 refills | Status: DC

## 2020-07-30 NOTE — BH Specialist Note (Addendum)
Integrated Behavioral Health via Telemedicine Visit  07/30/2020 Yosselin Zoeller [id number]  Number of Integrated Behavioral Health visits: 28/40 Session Start time: 10:15  Session End time: 10:45 Total time: 30  Referring Provider: Raliegh Ip, NP Patient/Family location: [name], [place] (daughter's home) [facility] Provider location: [facility] Center All persons participating in visit: CSW, patient Types of Service: Telephone visit  I connected with [name] by Telephone and verified that I am speaking with the correct person using two identifiers.Discussed confidentiality: Yes   I discussed the limitations of telemedicine and the availability of in person appointments.  Discussed there is a possibility of technology failure and discussed alternative modes of communication if that failure occurs.  I discussed that engaging in this telemedicine visit, they consent to the provision of behavioral healthcare and the services will be billed under their insurance.  Patient and/or legal guardian expressed understanding and consented to Telemedicine visit: Yes   Presenting Concerns: Patient and/or family reports the following symptoms/concerns: depression and anxiety Duration of problem: approx 30 years; Severity of problem: severe  Patient and/or Family's Strengths/Protective Factors: Physical Health (exercise, healthy diet, medication compliance, etc.) and empathy, religious beliefs  Goals Addressed: Patient will: 1.  Demonstrate ability to: Increase healthy adjustment to current life circumstances  Progress towards Goals: Ongoing  Interventions: Interventions utilized:  Motivational Interviewing and Psychoeducation and/or Health Education Standardized Assessments completed: Not Needed   Motivational interviewing (MI) and supportive counseling today regarding patient's engagement with [employer] [place] [facility]) for assistance with social  security disability application. Patient did connect with attorney at [facility] last week who will be assisting with this process. MI to explore patient's motivation to take initiative in maintaining contact with the attorney. Patient's goal is to have some income again so she can be more independent, as she is currently having to stay with her daughter.   Supportive counseling to assist patient in preparing for psychiatry appointment today. Patient exhibits anxiety about interacting with psychiatrist, though would like to keep him as her provider. Assisted patient in making a list of questions she wanted to ask psychiatrist during appointment. Patient called CSW after psychiatry appointment and reported on changes psych made to her medications. Patient reported feeling good about the appointment.  Patient and/or Family Response: Patient engaged in session.   Assessment: Patient currently experiencing anxiety,depression,schizophrenia and PTSD.Patient experiencing negative thoughts about self in context of these challenges. Patient also experiencing challenges navigating systems to access resources.  Patient may benefit from continued therapy including CBT to explore unhelpful thoughts about self in context of family dynamics,andmindfulness for coping with elevated emotions.  Plan: 1. Follow up with behavioral health clinician on: 07/29/20 2. Behavioral recommendations: exposure therapy plan for taking walks outside 3. Referral(s): Integrated [employer] (In Clinic)  I discussed the assessment and treatment plan with the patient and/or parent/guardian. They were provided an opportunity to ask questions and all were answered. They agreed with the plan and demonstrated an understanding of the instructions.   They were advised to call back or seek an in-person evaluation if the symptoms worsen or if the condition fails to improve as anticipated.  Abigail Butts, LCSW  Patient Care  Center [facility] [phone]
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
## 2020-08-03 ENCOUNTER — Telehealth: Payer: Self-pay | Admitting: Clinical

## 2020-08-03 NOTE — Telephone Encounter (Addendum)
Integrated Behavioral Health General Follow Up Note  08/03/2020 Name: Theresa Olson MRN: [id number] DOB: 1971/05/14 Theresa Olson is a 50 y.o. year old female who sees Kallie Locks, FNP for primary care. LCSW was initially consulted to assist patient with mental health concerns. CSW follows patient for Integrated Behavioral Health (IBH) counseling.  Interpreter: No.   Interpreter Name & Language: none  Assessment: Patient experiencing depression and anxiety.  Ongoing Intervention: Today CSW called patient to briefly check in about psychiatry appointment last week. Patient reported changes to her medications, though has not been able to get new ones filled. She ran out of one that she was instructed to taper down on. CSW advised patient to call psychiatry office at [phone] for instruction on this. Patient agreed to call today. CSW to follow.  Review of patient status, including review of consultants reports, relevant laboratory and other test results, and collaboration with appropriate care team members and the patient's provider was performed as part of comprehensive patient evaluation and provision of services.     Follow up Plan: 1. IBH televisit 08/05/20  Abigail Butts, LCSW Patient [facility] Center [facility] [phone]

## 2020-08-05 ENCOUNTER — Ambulatory Visit (INDEPENDENT_AMBULATORY_CARE_PROVIDER_SITE_OTHER): Payer: Medicaid Other | Admitting: Clinical

## 2020-08-05 ENCOUNTER — Telehealth (HOSPITAL_COMMUNITY): Payer: Self-pay | Admitting: *Deleted

## 2020-08-05 DIAGNOSIS — F339 Major depressive disorder, recurrent, unspecified: Secondary | ICD-10-CM | POA: Diagnosis not present

## 2020-08-05 DIAGNOSIS — F419 Anxiety disorder, unspecified: Secondary | ICD-10-CM

## 2020-08-05 DIAGNOSIS — F4310 Post-traumatic stress disorder, unspecified: Secondary | ICD-10-CM

## 2020-08-05 NOTE — BH Specialist Note (Addendum)
Integrated Behavioral Health via Telemedicine Visit  08/05/2020 Ritu Gagliardo [id number]  Number of Integrated Behavioral Health visits: 29/40 Session Start time: 3:00  Session End time: 3:55 Total time: 51   Referring Provider: Raliegh Ip, NP Patient/Family location: [address] [place] [place], [place] (daughter's home) [facility] Provider location: [facility] All persons participating in visit: CSW, patient Types of Service: Individual psychotherapy and Telephone visit  I connected with [name] Telephone or Video Enabled Telemedicine Application and verified that I am speaking with the correct person using two identifiers. Discussed confidentiality: Yes   I discussed the limitations of telemedicine and the availability of in person appointments.  Discussed there is a possibility of technology failure and discussed alternative modes of communication if that failure occurs.  I discussed that engaging in this telemedicine visit, they consent to the provision of behavioral healthcare and the services will be billed under their insurance.  Patient and/or legal guardian expressed understanding and consented to Telemedicine visit: Yes   Presenting Concerns: Patient and/or family reports the following symptoms/concerns: depression and anxiety Duration of problem: approx 30 years; Severity of problem: severe  Objective: Mood: Anxious and Euthymic and Affect: Appropriate Risk of harm to self or others: No plan to harm self or others. Assessed for this due to patient being off her medication for a few days. Patient denied thoughts of harming herself.  Patient and/or Family's Strengths/Protective Factors: Physical Health (exercise, healthy diet, medication compliance, etc.) and empathy, religious beliefs  Goals Addressed: Patient will: 1.  Reduce symptoms of: anxiety and depression  2.  Increase knowledge and/or ability of: coping skills and self-management skills   3.  Demonstrate ability to: Increase healthy adjustment to current life circumstances  Progress towards Goals: Ongoing  Interventions: Interventions utilized:  Mindfulness or Management consultant, Psychoeducation and/or Health Education and Link to [facility] Standardized Assessments completed: Not Needed   Patient had appointment with psychiatrist 07/31/20 and reported psychiatry changed some of her medications, but she ran out of some she needed to taper off and hasn't been able to get them refilled. Reported her daughter is bringing her refills today. CSW assisted patient today in calling psychiatry office and left message with nurse to request advice on how to taper off old and start new medications, now that patient has missed a few days of these meds. Patient to receive call back from nurse.  Patient reported she has been feeling more "hyper" and anxious in the few days she hasn't had her meds. Patient denied thoughts of harming herself.   CSW reviewed a mindful breathing exercise with patient that was learned in a previous session. Also taught patient two new mindful movement with breathing exercises. Patient reported feeling more relaxed after the exercises and plans to use them this week when feeling anxious. Patient has continued to use mindfulness on her own when she is feeling anxious.   Patient reported she has an interview with social security for her disability application. She has reached out to the attorney at [employer] who has been assisting her.  Patient and/or Family Response: Patient engaged in session. Patient continues to use mindfulness for coping at home between sessions. Patient reports using some light exercise as well.  Assessment: Patient currently experiencing anxiety,depression,schizophrenia and PTSD.Patient experiencing negative thoughts about self in context of these challenges. Patient also experiencing challenges navigating systems to access  resources.  Patient may benefit from continued therapy including CBT to explore unhelpful thoughts about self in context  of family dynamics,andmindfulness for coping with elevated emotions.  Plan: 1. Follow up with behavioral health clinician on: 08/12/20 2. Behavioral recommendations: mindfulness practices 3. Referral(s): Integrated [employer] (In Clinic)  I discussed the assessment and treatment plan with the patient and/or parent/guardian. They were provided an opportunity to ask questions and all were answered. They agreed with the plan and demonstrated an understanding of the instructions.   They were advised to call back or seek an in-person evaluation if the symptoms worsen or if the condition fails to improve as anticipated.  Abigail Butts, LCSW  [facility] [phone]

## 2020-08-05 NOTE — Telephone Encounter (Signed)
Pt therapist Abigail Butts called for pt who is off the taper schedule and doesn't remember the schedule and ran out of the medications. Writer attempted to call pt to evaluate medications and educate on taper schedule. No answer. Will attempt again tomorrow.

## 2020-08-12 ENCOUNTER — Other Ambulatory Visit (HOSPITAL_COMMUNITY): Payer: Self-pay | Admitting: Psychiatry

## 2020-08-12 ENCOUNTER — Other Ambulatory Visit: Payer: Self-pay

## 2020-08-12 ENCOUNTER — Ambulatory Visit (INDEPENDENT_AMBULATORY_CARE_PROVIDER_SITE_OTHER): Payer: Medicaid Other | Admitting: Clinical

## 2020-08-12 DIAGNOSIS — F4310 Post-traumatic stress disorder, unspecified: Secondary | ICD-10-CM

## 2020-08-12 DIAGNOSIS — F419 Anxiety disorder, unspecified: Secondary | ICD-10-CM

## 2020-08-12 DIAGNOSIS — F339 Major depressive disorder, recurrent, unspecified: Secondary | ICD-10-CM | POA: Diagnosis not present

## 2020-08-12 DIAGNOSIS — F200 Paranoid schizophrenia: Secondary | ICD-10-CM

## 2020-08-12 NOTE — BH Specialist Note (Addendum)
Integrated Behavioral Health via Telemedicine Visit  08/12/2020 Theresa Olson [id number]  Number of Integrated Behavioral Health visits: 30/40 Session Start time: 3:00  Session End time: 3:55 Total time: 30   Referring Provider: Raliegh Ip, NP Patient/Family location: [address] [place], [place] (daughter's home) [facility] Provider location: [facility] All persons participating in visit: CSW, patient Types of Service: Individual psychotherapy and Telephone visit  I connected with Theresa Olson Telephone or Video Enabled Telemedicine Application and verified that I am speaking with the correct person using two identifiers. Discussed confidentiality: Yes   I discussed the limitations of telemedicine and the availability of in person appointments.  Discussed there is a possibility of technology failure and discussed alternative modes of communication if that failure occurs.  I discussed that engaging in this telemedicine visit, they consent to the provision of behavioral healthcare and the services will be billed under their insurance.  Patient and/or legal guardian expressed understanding and consented to Telemedicine visit: Yes   Presenting Concerns: Patient and/or family reports the following symptoms/concerns: depression and anxiety Duration of problem: approx 30 years; Severity of problem: severe  Objective: Mood: Anxious and Euthymic and Affect: Appropriate Risk of harm to self or others: No plan to harm self or others. Assessed for this due to patient being off her medication for over a week. Patient denied thoughts of harming herself.  Patient and/or Family's Strengths/Protective Factors: Physical Health (exercise, healthy diet, medication compliance, etc.) and empathy, religious beliefs  Goals Addressed: Patient will: 1.  Reduce symptoms of: anxiety and depression  2.  Increase knowledge and/or ability of: coping skills and self-management skills   3.  Demonstrate ability to: Increase healthy adjustment to current life circumstances  Progress towards Goals: Ongoing  Interventions: Interventions utilized:  Mindfulness or Management consultant, CBT Cognitive Behavioral Therapy and Link to [facility] Standardized Assessments completed: Not Needed   Patient had appointment with psychiatrist 07/31/20 and reported psychiatry changed some of her medications, but she ran out of some she needed to taper off and still hasn't been able to get them refilled. She did not follow back up with RN at [facility] about medication question last week. CSW and patient called again today and LVM for RN. Nurse and psychiatrist responded via secure message and explained instructions for starting new meds once filled. CSW called patient again and informed of these instructions.  CSW reviewed mindful breathing exercise with patient that was learned in a previous session. Also taught patient two new mindful movement with breathing exercise. Patient reported feeling more relaxed after the exercises and plans to use them this week when feeling anxious. Patient has continued to use mindfulness on her own when she is feeling anxious.   Patient has been feeling more fearful and anxious since being off medications. Explored some unhelpful thoughts patient is having and shifted to more helpful thoughts about her fears. Exercise to help remind patient of good or neutral experiences, as patient often ruminates on past experiences that were bad.   Patient and/or Family Response: Patient engaged in session. Patient continues to use mindfulness for coping at home between sessions.   Assessment: Patient currently experiencing anxiety,depression,schizophrenia and PTSD.Patient experiencing negative thoughts about self in context of these challenges. Patient also experiencing challenges navigating systems to access resources.  Patient may benefit from continued  therapy including CBT to explore unhelpful thoughts about self in context of family dynamics,andmindfulness for coping with elevated emotions.  Plan: 1. Follow up  with behavioral health clinician on: 08/12/20 2. Behavioral recommendations: mindfulness practices 3. Referral(s): Integrated [employer] (In Clinic)  I discussed the assessment and treatment plan with the patient and/or parent/guardian. They were provided an opportunity to ask questions and all were answered. They agreed with the plan and demonstrated an understanding of the instructions.   They were advised to call back or seek an in-person evaluation if the symptoms worsen or if the condition fails to improve as anticipated.  Abigail Butts, LCSW  [facility] [phone]

## 2020-08-19 ENCOUNTER — Other Ambulatory Visit: Payer: Self-pay

## 2020-08-19 ENCOUNTER — Ambulatory Visit (INDEPENDENT_AMBULATORY_CARE_PROVIDER_SITE_OTHER): Payer: Medicaid Other | Admitting: Clinical

## 2020-08-19 DIAGNOSIS — F4310 Post-traumatic stress disorder, unspecified: Secondary | ICD-10-CM

## 2020-08-19 DIAGNOSIS — F419 Anxiety disorder, unspecified: Secondary | ICD-10-CM

## 2020-08-19 DIAGNOSIS — F339 Major depressive disorder, recurrent, unspecified: Secondary | ICD-10-CM

## 2020-08-19 NOTE — BH Specialist Note (Addendum)
Integrated Behavioral Health via Telemedicine Visit  08/19/2020 Cory Rama [id number]  Number of Integrated Behavioral Health visits: 31/40 Session Start time: 11:00  Session End time: 11:55 Total time: 29   Referring Provider: Raliegh Ip, NP Patient/Family location: [address] [place], [place] (daughter's home) [facility] Provider location: [facility] All persons participating in visit: CSW, patient Types of Service: Individual psychotherapy and Telephone visit  I connected with [name] Telephone or Video Enabled Telemedicine Application and verified that I am speaking with the correct person using two identifiers. Discussed confidentiality: Yes   I discussed the limitations of telemedicine and the availability of in person appointments.  Discussed there is a possibility of technology failure and discussed alternative modes of communication if that failure occurs.  I discussed that engaging in this telemedicine visit, they consent to the provision of behavioral healthcare and the services will be billed under their insurance.  Patient and/or legal guardian expressed understanding and consented to Telemedicine visit: Yes   Presenting Concerns: Patient and/or family reports the following symptoms/concerns: depression and anxiety Duration of problem: approx 30 years; Severity of problem: severe  Objective: Mood: Anxious and Euthymic and Affect: Appropriate Risk of harm to self or others: No plan to harm self or others.   Patient and/or Family's Strengths/Protective Factors: Physical Health (exercise, healthy diet, medication compliance, etc.) and empathy, religious beliefs  Goals Addressed: Patient will: 1.  Reduce symptoms of: anxiety and depression  2.  Increase knowledge and/or ability of: coping skills and self-management skills  3.  Demonstrate ability to: Increase healthy adjustment to current life circumstances  Progress towards  Goals: Ongoing  Interventions: Interventions utilized:  CBT Cognitive Behavioral Therapy, Link to [facility] and DBT [employer] Therapy Standardized Assessments completed: Not Needed   Patient has now gotten her medications from psychiatrist filled and restarted taking them. Patient reported feeling a bit better back on her medications.   Patient has unhelpful thoughts about self in context of mental health challenges and negative messaging from family. Explored these unhelpful thoughts today and rehearsed more helpful thoughts. Patient records helpful thoughts in her journal. Discussed plan for patient to review and rehearse these outside of session.   DBT strategies for emotional regulation today' reviewed strategies to improve mood after difficult interpersonal interactions when patient's emotions become elevated.   Patient and/or Family Response: Patient engaged in session. Patient continues to use mindfulness for coping at home between sessions.   Assessment: Patient currently experiencing anxiety,depression,schizophrenia and PTSD.Patient experiencing negative thoughts about self in context of these challenges. Patient also experiencing challenges navigating systems to access resources.  Patient may benefit from continued therapy including CBT to explore unhelpful thoughts about self in context of family dynamics,andmindfulness for coping with elevated emotions.  Plan: 1. Follow up with behavioral health clinician on: 08/26/20 2. Behavioral recommendations: mindfulness practices 3. Referral(s): Integrated [employer] (In Clinic)  I discussed the assessment and treatment plan with the patient and/or parent/guardian. They were provided an opportunity to ask questions and all were answered. They agreed with the plan and demonstrated an understanding of the instructions.   They were advised to call back or seek an in-person evaluation if the symptoms  worsen or if the condition fails to improve as anticipated.  Abigail Butts, LCSW  [facility] [phone]

## 2020-08-20 ENCOUNTER — Encounter (HOSPITAL_COMMUNITY): Payer: Self-pay | Admitting: Psychiatry

## 2020-08-20 ENCOUNTER — Telehealth (INDEPENDENT_AMBULATORY_CARE_PROVIDER_SITE_OTHER): Payer: Medicaid Other | Admitting: Psychiatry

## 2020-08-20 DIAGNOSIS — F200 Paranoid schizophrenia: Secondary | ICD-10-CM

## 2020-08-20 DIAGNOSIS — F4310 Post-traumatic stress disorder, unspecified: Secondary | ICD-10-CM | POA: Diagnosis not present

## 2020-08-20 DIAGNOSIS — F419 Anxiety disorder, unspecified: Secondary | ICD-10-CM

## 2020-08-20 MED ORDER — DULOXETINE HCL 60 MG PO CPEP
ORAL_CAPSULE | ORAL | 1 refills | Status: DC
Start: 2020-08-20 — End: 2020-10-01

## 2020-08-20 MED ORDER — ARIPIPRAZOLE 15 MG PO TABS: 7.5000 mg | ORAL_TABLET | Freq: Every day | ORAL | 1 refills | Status: DC

## 2020-08-20 MED ORDER — HYDROXYZINE HCL 25 MG PO TABS: 25.0000 mg | ORAL_TABLET | Freq: Three times a day (TID) | ORAL | 1 refills | Status: DC | PRN
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
## 2020-08-26 ENCOUNTER — Ambulatory Visit (INDEPENDENT_AMBULATORY_CARE_PROVIDER_SITE_OTHER): Payer: Medicaid Other | Admitting: Clinical

## 2020-08-26 DIAGNOSIS — F4310 Post-traumatic stress disorder, unspecified: Secondary | ICD-10-CM

## 2020-08-26 DIAGNOSIS — F419 Anxiety disorder, unspecified: Secondary | ICD-10-CM

## 2020-08-26 DIAGNOSIS — F339 Major depressive disorder, recurrent, unspecified: Secondary | ICD-10-CM

## 2020-08-26 NOTE — BH Specialist Note (Addendum)
Integrated Behavioral Health via Telemedicine Visit  08/26/2020 Theresa Olson [id number]  Number of Integrated Behavioral Health visits: 32/40 Session Start time: 2:15  Session End time: 2:55 Total time: 40   Referring Provider: Raliegh Ip, NP Patient/Family location: [address] [place], [place] (daughter's home) [facility] Provider location: [facility] All persons participating in visit: CSW, patient Types of Service: Individual psychotherapy and Telephone visit  I connected with [name] Telephone or Video Enabled Telemedicine Application and verified that I am speaking with the correct person using two identifiers. Discussed confidentiality: Yes   I discussed the limitations of telemedicine and the availability of in person appointments.  Discussed there is a possibility of technology failure and discussed alternative modes of communication if that failure occurs.  I discussed that engaging in this telemedicine visit, they consent to the provision of behavioral healthcare and the services will be billed under their insurance.  Patient and/or legal guardian expressed understanding and consented to Telemedicine visit: Yes   Presenting Concerns: Patient and/or family reports the following symptoms/concerns: depression and anxiety Duration of problem: approx 30 years; Severity of problem: severe  Objective: Mood: Anxious and Euthymic and Affect: Appropriate Risk of harm to self or others: No plan to harm self or others.   Patient and/or Family's Strengths/Protective Factors: Physical Health (exercise, healthy diet, medication compliance, etc.) and empathy, religious beliefs  Goals Addressed: Patient will: 1.  Reduce symptoms of: anxiety and depression  2.  Increase knowledge and/or ability of: coping skills and self-management skills  3.  Demonstrate ability to: Increase healthy adjustment to current life circumstances  Progress towards  Goals: Ongoing  Interventions: Interventions utilized:  CBT Cognitive Behavioral Therapy and Supportive Counseling Standardized Assessments completed: Not Needed   Supportive counseling and CBt today to address unhelpful thoughts patient continues to have about self in context of her mental health. Patient worries about getting back up on her feet as she goes through the process of applying for disability benefits. Practiced and reviewed mindfulness and DBT skills on improving the moment when patient experiencing unhelpful thoughts about self. Made plan for noting warning signs of worsening mood and what strategies to use to improve it.   Patient and/or Family Response: Patient engaged in session. Patient continues to use mindfulness for coping at home between sessions.   Assessment: Patient currently experiencing anxiety,depression,schizophrenia and PTSD.Patient experiencing negative thoughts about self in context of these challenges. Patient also experiencing challenges navigating systems to access resources.  Patient may benefit from continued therapy including CBT to explore unhelpful thoughts about self in context of family dynamics,andmindfulness for coping with elevated emotions.  Plan: 1. Follow up with behavioral health clinician on: 08/26/20 2. Behavioral recommendations: mindfulness practices 3. Referral(s): Integrated [employer] (In Clinic)  I discussed the assessment and treatment plan with the patient and/or parent/guardian. They were provided an opportunity to ask questions and all were answered. They agreed with the plan and demonstrated an understanding of the instructions.   They were advised to call back or seek an in-person evaluation if the symptoms worsen or if the condition fails to improve as anticipated.  Abigail Butts, LCSW  [facility] [phone]

## 2020-09-02 ENCOUNTER — Ambulatory Visit (INDEPENDENT_AMBULATORY_CARE_PROVIDER_SITE_OTHER): Payer: Medicaid Other | Admitting: Clinical

## 2020-09-02 ENCOUNTER — Other Ambulatory Visit: Payer: Self-pay

## 2020-09-02 DIAGNOSIS — F4310 Post-traumatic stress disorder, unspecified: Secondary | ICD-10-CM

## 2020-09-02 DIAGNOSIS — F419 Anxiety disorder, unspecified: Secondary | ICD-10-CM

## 2020-09-02 DIAGNOSIS — F339 Major depressive disorder, recurrent, unspecified: Secondary | ICD-10-CM

## 2020-09-02 NOTE — BH Specialist Note (Addendum)
Integrated Behavioral Health via Telemedicine Visit  09/02/2020 Theresa Olson [id number]  Number of Integrated Behavioral Health visits: 33/40 Session Start time: 2:15  Session End time: 3:00 Total time: 45   Referring Provider: Raliegh Ip, NP Patient/Family location: [address] [place], [place] (daughter's home) [facility] Provider location: [facility] All persons participating in visit: CSW, patient Types of Service: Individual psychotherapy and Telephone visit  I connected with Theresa Olson via Telephone and verified that I am speaking with the correct person using two identifiers. Discussed confidentiality: Yes   I discussed the limitations of telemedicine and the availability of in person appointments.  Discussed there is a possibility of technology failure and discussed alternative modes of communication if that failure occurs.  I discussed that engaging in this telemedicine visit, they consent to the provision of behavioral healthcare and the services will be billed under their insurance.  Patient and/or legal guardian expressed understanding and consented to Telemedicine visit: Yes   Presenting Concerns: Patient and/or family reports the following symptoms/concerns: depression and anxiety Duration of problem: approx 30 years; Severity of problem: severe  Objective: Mood: Euthymic and Affect: Appropriate Risk of harm to self or others: No plan to harm self or others   Patient and/or Family's Strengths/Protective Factors: Physical Health (exercise, healthy diet, medication compliance, etc.) and empathy, religious beliefs  Goals Addressed: Patient will: 1.  Reduce symptoms of: anxiety and depression  2.  Increase knowledge and/or ability of: coping skills and self-management skills  3.  Demonstrate ability to: Increase healthy adjustment to current life circumstances  Progress towards Goals: Ongoing  Interventions: Interventions utilized:   CBT Cognitive Behavioral Therapy Standardized Assessments completed: Not Needed  CBT today to explore patient's unhelpful thoughts about self in context with relationship with her adult children. Reviewed evidence and usefulness of thoughts. Discussed accountability in parenting as well as elements of parenting patient did and did not have control over. Validated and normalized patient's emotional experience.  Patient and/or Family Response: Patient engaged in session.  Assessment: Patient currently experiencing anxiety,depression,schizophrenia and PTSD.Patient experiencing negative thoughts about self in context of these challenges.Patient also experiencing challenges navigating systems to access resources.  Patient may benefit from continued therapy including CBT to explore unhelpful thoughts about self in context of family dynamics,andmindfulness for coping with elevated emotions.  Plan: 1. Follow up with behavioral health clinician on: 09/09/20 2. Referral(s): Integrated [employer] (In Clinic)  I discussed the assessment and treatment plan with the patient and/or parent/guardian. They were provided an opportunity to ask questions and all were answered. They agreed with the plan and demonstrated an understanding of the instructions.   They were advised to call back or seek an in-person evaluation if the symptoms worsen or if the condition fails to improve as anticipated.  Theresa Butts, LCSW  [facility] [phone]

## 2020-09-03 ENCOUNTER — Other Ambulatory Visit (HOSPITAL_COMMUNITY): Payer: Self-pay | Admitting: Psychiatry

## 2020-09-03 DIAGNOSIS — F4310 Post-traumatic stress disorder, unspecified: Secondary | ICD-10-CM

## 2020-09-03 DIAGNOSIS — F200 Paranoid schizophrenia: Secondary | ICD-10-CM

## 2020-09-09 ENCOUNTER — Other Ambulatory Visit: Payer: Self-pay

## 2020-09-09 ENCOUNTER — Ambulatory Visit (INDEPENDENT_AMBULATORY_CARE_PROVIDER_SITE_OTHER): Payer: Medicaid Other | Admitting: Clinical

## 2020-09-09 DIAGNOSIS — F339 Major depressive disorder, recurrent, unspecified: Secondary | ICD-10-CM

## 2020-09-09 DIAGNOSIS — F419 Anxiety disorder, unspecified: Secondary | ICD-10-CM

## 2020-09-09 DIAGNOSIS — F4310 Post-traumatic stress disorder, unspecified: Secondary | ICD-10-CM

## 2020-09-09 NOTE — BH Specialist Note (Addendum)
Integrated Behavioral Health via Telemedicine Visit  09/09/2020 Tyna Huertas [id number]  Number of Integrated Behavioral Health visits: 34/40 Session Start time: 1:50  Session End time: 2:40 Total time: 50   Referring Provider: Raliegh Ip, NP Patient/Family location: [address] [place], [place] (daughter's home) [facility] Provider location: [facility] All persons participating in visit: CSW, patient Types of Service: Individual psychotherapy and Telephone visit  I connected with [name] via Telephone and verified that I am speaking with the correct person using two identifiers. Discussed confidentiality: Yes   I discussed the limitations of telemedicine and the availability of in person appointments.  Discussed there is a possibility of technology failure and discussed alternative modes of communication if that failure occurs.  I discussed that engaging in this telemedicine visit, they consent to the provision of behavioral healthcare and the services will be billed under their insurance.  Patient and/or legal guardian expressed understanding and consented to Telemedicine visit: Yes   Presenting Concerns: Patient and/or family reports the following symptoms/concerns: depression and anxiety Duration of problem: approx 30 years; Severity of problem: severe  Objective: Mood: Euthymic and Affect: Appropriate Risk of harm to self or others: No plan to harm self or others   Patient and/or Family's Strengths/Protective Factors: Physical Health (exercise, healthy diet, medication compliance, etc.) and empathy, religious beliefs  Goals Addressed: Patient will: 1.  Reduce symptoms of: anxiety and depression  2.  Increase knowledge and/or ability of: coping skills and self-management skills  3.  Demonstrate ability to: Increase healthy adjustment to current life circumstances  Progress towards Goals: Ongoing  Interventions: Interventions utilized:   CBT Cognitive Behavioral Therapy Standardized Assessments completed: Not Needed  CBT today to explore unhelpful thoughts about self in context of financial and living situation. Reflection on finding meaning and purpose in other ways besides work. Mindful meditation practice to explore the effect of these thoughts on the body and the effect of more helpful/positive thoughts. Review of previous skills learned for improving the moment when patient experiencing negative emotions associated with family dynamics.  Patient and/or Family Response: Patient engaged in session.  Assessment: Patient currently experiencing anxiety,depression,schizophrenia and PTSD.Patient experiencing negative thoughts about self in context of these challenges.Patient also experiencing challenges navigating systems to access resources.  Patient may benefit from continued therapy including CBT to explore unhelpful thoughts about self in context of family dynamics,andmindfulness for coping with elevated emotions.  Plan: 1. Follow up with behavioral health clinician on: 09/16/20 2. Referral(s): Integrated [employer] (In Clinic)  I discussed the assessment and treatment plan with the patient and/or parent/guardian. They were provided an opportunity to ask questions and all were answered. They agreed with the plan and demonstrated an understanding of the instructions.   They were advised to call back or seek an in-person evaluation if the symptoms worsen or if the condition fails to improve as anticipated.  Abigail Butts, LCSW  [facility] [phone]

## 2020-09-16 ENCOUNTER — Ambulatory Visit (INDEPENDENT_AMBULATORY_CARE_PROVIDER_SITE_OTHER): Payer: Medicaid Other | Admitting: Clinical

## 2020-09-16 ENCOUNTER — Other Ambulatory Visit: Payer: Self-pay

## 2020-09-16 DIAGNOSIS — F339 Major depressive disorder, recurrent, unspecified: Secondary | ICD-10-CM

## 2020-09-16 DIAGNOSIS — F419 Anxiety disorder, unspecified: Secondary | ICD-10-CM

## 2020-09-16 DIAGNOSIS — F4310 Post-traumatic stress disorder, unspecified: Secondary | ICD-10-CM

## 2020-09-16 NOTE — BH Specialist Note (Addendum)
Integrated Behavioral Health via Telemedicine Visit  09/16/2020 Theresa Olson [id number]  Number of Integrated Behavioral Health visits: 35/40 Session Start time: 1:00  Session End time: 2:00 Total time: 60  Referring Provider: Raliegh Ip, NP Patient/Family location: [address] [place], [place] (daughter's home) [facility] Provider location: [facility] All persons participating in visit: CSW, patient Types of Service: Individual psychotherapy and Telephone visit  I connected with Theresa Olson via Telephone and verified that I am speaking with the correct person using two identifiers. Discussed confidentiality: Yes   I discussed the limitations of telemedicine and the availability of in person appointments.  Discussed there is a possibility of technology failure and discussed alternative modes of communication if that failure occurs.  I discussed that engaging in this telemedicine visit, they consent to the provision of behavioral healthcare and the services will be billed under their insurance.  Patient and/or legal guardian expressed understanding and consented to Telemedicine visit: Yes   Presenting Concerns: Patient and/or family reports the following symptoms/concerns: depression and anxiety Duration of problem: approx 30 years; Severity of problem: severe  Objective: Mood: Euthymic and Affect: Appropriate Risk of harm to self or others: No plan to harm self or others   Patient and/or Family's Strengths/Protective Factors: Physical Health (exercise, healthy diet, medication compliance, etc.) and empathy, religious beliefs  Goals Addressed: Patient will: 1.  Reduce symptoms of: anxiety and depression  2.  Increase knowledge and/or ability of: coping skills and self-management skills  3.  Demonstrate ability to: Increase healthy adjustment to current life circumstances  Progress towards Goals: Ongoing  Interventions: Interventions utilized:   CBT Cognitive Behavioral Therapy and Supportive Counseling Standardized Assessments completed: Not Needed  CBT and supportive counseling today. Patient reports her mood is strongly negatively impacted by interacting with her sister. Explored thoughts about self in context of this relationship and negative messaging patient receives from family members. Discussed how to care for self in the context of this relationship and limit contact as needed. Explored noticing emotions and bodily sensations after negative interactions. Mindful meditation practice to explore the effect of these thoughts on the body and the effect of more helpful/positive thoughts. Review of previous skills learned for improving the moment when patient experiencing negative emotions associated with family dynamics.   Patient mood so negatively impacted by these family interactions, patient stated "I think about dying." CSW assessed; patient denied thoughts or a plan to harm herself. Patient stated she just feels so down after talking to her sister.   Patient and/or Family Response: Patient engaged in session. Patient exhibited increased insight into importance of taking care of herself and plans to limit interactions with sister.   Assessment: Patient currently experiencing anxiety,depression,schizophrenia and PTSD.Patient experiencing negative thoughts about self in context of these challenges.Patient also experiencing challenges navigating systems to access resources.  Patient may benefit from continued therapy including CBT to explore unhelpful thoughts about self in context of family dynamics,andmindfulness for coping with elevated emotions.  Plan: 1. Follow up with behavioral health clinician on: 09/23/20 2. Referral(s): Integrated [employer] (In Clinic)  I discussed the assessment and treatment plan with the patient and/or parent/guardian. They were provided an opportunity to ask questions and all were  answered. They agreed with the plan and demonstrated an understanding of the instructions.   They were advised to call back or seek an in-person evaluation if the symptoms worsen or if the condition fails to improve as anticipated.  Abigail Butts, LCSW  [facility] [phone]

## 2020-09-23 ENCOUNTER — Other Ambulatory Visit: Payer: Self-pay

## 2020-09-23 ENCOUNTER — Ambulatory Visit (INDEPENDENT_AMBULATORY_CARE_PROVIDER_SITE_OTHER): Payer: Medicaid Other | Admitting: Clinical

## 2020-09-23 DIAGNOSIS — F339 Major depressive disorder, recurrent, unspecified: Secondary | ICD-10-CM

## 2020-09-23 DIAGNOSIS — F419 Anxiety disorder, unspecified: Secondary | ICD-10-CM | POA: Diagnosis not present

## 2020-09-23 DIAGNOSIS — F4310 Post-traumatic stress disorder, unspecified: Secondary | ICD-10-CM

## 2020-09-23 NOTE — BH Specialist Note (Addendum)
Integrated Behavioral Health via Telemedicine Visit  09/23/2020 Theresa Olson [id number]  Number of Integrated Behavioral Health visits: 36/40 Session Start time: 1:00  Session End time: 1:40 Total time: 40   Referring Provider: Raliegh Ip, NP Patient/Family location: [address] [place], [place] (daughter's home) [facility] Provider location: [facility] All persons participating in visit: CSW, patient Types of Service: Individual psychotherapy and Telephone visit  I connected with Theresa Olson via Telephone and verified that I am speaking with the correct person using two identifiers. Discussed confidentiality: Yes   I discussed the limitations of telemedicine and the availability of in person appointments.  Discussed there is a possibility of technology failure and discussed alternative modes of communication if that failure occurs.  I discussed that engaging in this telemedicine visit, they consent to the provision of behavioral healthcare and the services will be billed under their insurance.  Patient and/or legal guardian expressed understanding and consented to Telemedicine visit: Yes   Presenting Concerns: Patient and/or family reports the following symptoms/concerns: depression and anxiety Duration of problem: approx 30 years; Severity of problem: severe  Objective: Mood: Euthymic and Affect: Appropriate Risk of harm to self or others: No plan to harm self or others   Patient and/or Family's Strengths/Protective Factors: Physical Health (exercise, healthy diet, medication compliance, etc.) and empathy, religious beliefs  Goals Addressed: Patient will: 1.  Reduce symptoms of: anxiety and depression  2.  Increase knowledge and/or ability of: coping skills and self-management skills  3.  Demonstrate ability to: Increase healthy adjustment to current life circumstances  Progress towards Goals: Ongoing  Interventions: Interventions utilized:   CBT Cognitive Behavioral Therapy and Supportive Counseling Standardized Assessments completed: Not Needed  CBT today to explore patient's thoughts and emotions around interactions with her sister. Patient has decided to limit interactions with sister, as she reports a significant negative impact in mood after talking to her. Patient reported ignoring her sister's call this week. Processed patient's emotions around this; patient reported she is feeling empowered and was glad she was able to take action so her mood isn't impacted. Patient is having difficulty obtaining her prescriptions, as she has been staying with her daughter out of town. Assisted patient in finding contact information for her pharmacy and the pharmacy she wants to switch to. Empowered patient to reach out to pharmacy to have her prescriptions changed over. CSW to follow up and assist if needed. Patient continues to use breathing exercises mindful breathing techniques for coping with elevated emotions and anxiety.  Patient and/or Family Response: Patient engaged in session.   Assessment: Patient currently experiencing anxiety,depression,schizophrenia and PTSD.Patient experiencing negative thoughts about self in context of these challenges.Patient also experiencing challenges navigating systems to access resources.  Patient may benefit from continued therapy including CBT to explore unhelpful thoughts about self in context of family dynamics,andmindfulness for coping with elevated emotions.  Plan: 1. Follow up with behavioral health clinician on: 10/01/20 2. Referral(s): Integrated [employer] (In Clinic)  I discussed the assessment and treatment plan with the patient and/or parent/guardian. They were provided an opportunity to ask questions and all were answered. They agreed with the plan and demonstrated an understanding of the instructions.   They were advised to call back or seek an in-person evaluation if  the symptoms worsen or if the condition fails to improve as anticipated.  Abigail Butts, LCSW  [facility] [phone]

## 2020-09-25 ENCOUNTER — Telehealth: Payer: Self-pay | Admitting: Clinical

## 2020-09-25 NOTE — Telephone Encounter (Signed)
Integrated Behavioral Health General Follow Up Note  09/25/2020 Name: Mikaylee Arseneau MRN: [id number] DOB: 05-02-1971 Oriel Ojo is a 50 y.o. year old female who sees Kallie Locks, FNP (Inactive) for primary care. LCSW follows patient for Integrated Behavioral Health (IBH) counseling.  Interpreter: No.   Interpreter Name & Language: none  Assessment: Patient experiencing depression and anxiety.  Ongoing Intervention: Today CSW called patient to follow up regarding patient's medications. She was having trouble picking up her prescriptions, as she is staying with her daughter out of town temporarily. Patient indicated today she was able to call and get her prescriptions changed over to a pharmacy closer to her. Confirmed patient plans to attend PCP appointment [facility]) 10/01/20. CSW will plan to meet with patient that day as well.  Review of patient status, including review of consultants reports, relevant laboratory and other test results, and collaboration with appropriate care team members and the patient's provider was performed as part of comprehensive patient evaluation and provision of services.     Follow up Plan: 1. CSW to follow.  Abigail Butts, LCSW [facility] [phone]

## 2020-09-29 ENCOUNTER — Telehealth (HOSPITAL_COMMUNITY): Payer: Self-pay

## 2020-09-29 NOTE — Telephone Encounter (Signed)
BC/BS HEALTHY BLUE PRESCRIPTION COVERAGE APPROVED  ARIPIPRAZOLE 15MG  TABLET REFERENCE # EFFECTIVE 09/29/2020 TO 09/29/2021  S/W [facility] NOTIFIED PHARMACY/PT

## 2020-09-30 ENCOUNTER — Ambulatory Visit: Payer: Self-pay | Admitting: Family Medicine

## 2020-10-01 ENCOUNTER — Telehealth (HOSPITAL_COMMUNITY): Payer: Self-pay | Admitting: *Deleted

## 2020-10-01 ENCOUNTER — Other Ambulatory Visit: Payer: Self-pay

## 2020-10-01 ENCOUNTER — Telehealth (INDEPENDENT_AMBULATORY_CARE_PROVIDER_SITE_OTHER): Payer: Medicaid Other | Admitting: Psychiatry

## 2020-10-01 ENCOUNTER — Ambulatory Visit (INDEPENDENT_AMBULATORY_CARE_PROVIDER_SITE_OTHER): Payer: Medicaid Other | Admitting: Clinical

## 2020-10-01 ENCOUNTER — Encounter (HOSPITAL_COMMUNITY): Payer: Self-pay | Admitting: Psychiatry

## 2020-10-01 ENCOUNTER — Ambulatory Visit (INDEPENDENT_AMBULATORY_CARE_PROVIDER_SITE_OTHER): Payer: Medicaid Other | Admitting: Nurse Practitioner

## 2020-10-01 ENCOUNTER — Encounter: Payer: Self-pay | Admitting: Nurse Practitioner

## 2020-10-01 VITALS — BP 123/76 | HR 76 | Temp 97.9°F | Ht 60.0 in | Wt 187.0 lb

## 2020-10-01 DIAGNOSIS — F419 Anxiety disorder, unspecified: Secondary | ICD-10-CM

## 2020-10-01 DIAGNOSIS — F332 Major depressive disorder, recurrent severe without psychotic features: Secondary | ICD-10-CM | POA: Diagnosis not present

## 2020-10-01 DIAGNOSIS — I10 Essential (primary) hypertension: Secondary | ICD-10-CM

## 2020-10-01 DIAGNOSIS — F4310 Post-traumatic stress disorder, unspecified: Secondary | ICD-10-CM

## 2020-10-01 DIAGNOSIS — E785 Hyperlipidemia, unspecified: Secondary | ICD-10-CM

## 2020-10-01 DIAGNOSIS — F200 Paranoid schizophrenia: Secondary | ICD-10-CM

## 2020-10-01 DIAGNOSIS — I160 Hypertensive urgency: Secondary | ICD-10-CM | POA: Diagnosis not present

## 2020-10-01 DIAGNOSIS — R7303 Prediabetes: Secondary | ICD-10-CM

## 2020-10-01 DIAGNOSIS — F339 Major depressive disorder, recurrent, unspecified: Secondary | ICD-10-CM

## 2020-10-01 LAB — POCT GLYCOSYLATED HEMOGLOBIN (HGB A1C)
HbA1c POC (<> result, manual entry): 6.5 % (ref 4.0–5.6)
HbA1c, POC (controlled diabetic range): 6.5 % (ref 0.0–7.0)
HbA1c, POC (prediabetic range): 6.5 % — AB (ref 5.7–6.4)
Hemoglobin A1C: 6.5 % — AB (ref 4.0–5.6)

## 2020-10-01 MED ORDER — ATORVASTATIN CALCIUM 10 MG PO TABS: 10.0000 mg | ORAL_TABLET | Freq: Every day | ORAL | 3 refills | Status: DC

## 2020-10-01 MED ORDER — ARIPIPRAZOLE 15 MG PO TABS: 7.5000 mg | ORAL_TABLET | Freq: Every day | ORAL | 2 refills | Status: DC

## 2020-10-01 MED ORDER — HYDROXYZINE HCL 25 MG PO TABS: 25.0000 mg | ORAL_TABLET | Freq: Two times a day (BID) | ORAL | 2 refills | Status: DC | PRN

## 2020-10-01 MED ORDER — DULOXETINE HCL 60 MG PO CPEP
ORAL_CAPSULE | ORAL | 2 refills | Status: DC
Start: 2020-10-01 — End: 2021-01-06

## 2020-10-01 MED ORDER — AMLODIPINE BESYLATE 5 MG PO TABS: 5.0000 mg | ORAL_TABLET | Freq: Every day | ORAL | 3 refills | Status: DC

## 2020-10-01 NOTE — Patient Instructions (Addendum)
Health Maintenance, Female Adopting a healthy lifestyle and getting preventive care are important in promoting health and wellness. Ask your health care provider about:  The right schedule for you to have regular tests and exams.  Things you can do on your own to prevent diseases and keep yourself healthy. What should I know about diet, weight, and exercise? Eat a healthy diet  Eat a diet that includes plenty of vegetables, fruits, low-fat dairy products, and lean protein.  Do not eat a lot of foods that are high in solid fats, added sugars, or sodium.   Maintain a healthy weight Body mass index (BMI) is used to identify weight problems. It estimates body fat based on height and weight. Your health care provider can help determine your BMI and help you achieve or maintain a healthy weight. Get regular exercise Get regular exercise. This is one of the most important things you can do for your health. Most adults should:  Exercise for at least 150 minutes each week. The exercise should increase your heart rate and make you sweat (moderate-intensity exercise).  Do strengthening exercises at least twice a week. This is in addition to the moderate-intensity exercise.  Spend less time sitting. Even light physical activity can be beneficial. Watch cholesterol and blood lipids Have your blood tested for lipids and cholesterol at 51 years of age, then have this test every 5 years. Have your cholesterol levels checked more often if:  Your lipid or cholesterol levels are high.  You are older than 51 years of age.  You are at high risk for heart disease. What should I know about cancer screening? Depending on your health history and family history, you may need to have cancer screening at various ages. This may include screening for:  Breast cancer.  Cervical cancer.  Colorectal cancer.  Skin cancer.  Lung cancer. What should I know about heart disease, diabetes, and high blood  pressure? Blood pressure and heart disease  High blood pressure causes heart disease and increases the risk of stroke. This is more likely to develop in people who have high blood pressure readings, are of African descent, or are overweight.  Have your blood pressure checked: ? Every 3-5 years if you are 18-39 years of age. ? Every year if you are 40 years old or older. Diabetes Have regular diabetes screenings. This checks your fasting blood sugar level. Have the screening done:  Once every three years after age 40 if you are at a normal weight and have a low risk for diabetes.  More often and at a younger age if you are overweight or have a high risk for diabetes. What should I know about preventing infection? Hepatitis B If you have a higher risk for hepatitis B, you should be screened for this virus. Talk with your health care provider to find out if you are at risk for hepatitis B infection. Hepatitis C Testing is recommended for:  Everyone born from 1945 through 1965.  Anyone with known risk factors for hepatitis C. Sexually transmitted infections (STIs)  Get screened for STIs, including gonorrhea and chlamydia, if: ? You are sexually active and are younger than 51 years of age. ? You are older than 51 years of age and your health care provider tells you that you are at risk for this type of infection. ? Your sexual activity has changed since you were last screened, and you are at increased risk for chlamydia or gonorrhea. Ask your health care provider   if you are at risk.  Ask your health care provider about whether you are at high risk for HIV. Your health care provider may recommend a prescription medicine to help prevent HIV infection. If you choose to take medicine to prevent HIV, you should first get tested for HIV. You should then be tested every 3 months for as long as you are taking the medicine. Pregnancy  If you are about to stop having your period (premenopausal) and  you may become pregnant, seek counseling before you get pregnant.  Take 400 to 800 micrograms (mcg) of folic acid every day if you become pregnant.  Ask for birth control (contraception) if you want to prevent pregnancy. Osteoporosis and menopause Osteoporosis is a disease in which the bones lose minerals and strength with aging. This can result in bone fractures. If you are 23 years old or older, or if you are at risk for osteoporosis and fractures, ask your health care provider if you should:  Be screened for bone loss.  Take a calcium or vitamin D supplement to lower your risk of fractures.  Be given hormone replacement therapy (HRT) to treat symptoms of menopause. Follow these instructions at home: Lifestyle  Do not use any products that contain nicotine or tobacco, such as cigarettes, e-cigarettes, and chewing tobacco. If you need help quitting, ask your health care provider.  Do not use street drugs.  Do not share needles.  Ask your health care provider for help if you need support or information about quitting drugs. Alcohol use  Do not drink alcohol if: ? Your health care provider tells you not to drink. ? You are pregnant, may be pregnant, or are planning to become pregnant.  If you drink alcohol: ? Limit how much you use to 0-1 drink a day. ? Limit intake if you are breastfeeding.  Be aware of how much alcohol is in your drink. In the [place] one drink equals one 12 oz bottle of beer (355 mL), one 5 oz glass of wine (148 mL), or one 1 oz glass of hard liquor (44 mL). General instructions  Schedule regular health, dental, and eye exams.  Stay current with your vaccines.  Tell your health care provider if: ? You often feel depressed. ? You have ever been abused or do not feel safe at home. Summary  Adopting a healthy lifestyle and getting preventive care are important in promoting health and wellness.  Follow your health care provider's instructions about healthy  diet, exercising, and getting tested or screened for diseases.  Follow your health care provider's instructions on monitoring your cholesterol and blood pressure. This information is not intended to replace advice given to you by your health care provider. Make sure you discuss any questions you have with your health care provider. Document Revised: 05/09/2018 Document Reviewed: 05/09/2018 Elsevier Patient Education  2021 [employer]    Managing Your Hypertension Hypertension, also called high blood pressure, is when the force of the blood pressing against the walls of the arteries is too strong. Arteries are blood vessels that carry blood from your heart throughout your body. Hypertension forces the heart to work harder to pump blood and may cause the arteries to become narrow or stiff. Understanding blood pressure readings Your personal target blood pressure may vary depending on your medical conditions, your age, and other factors. A blood pressure reading includes a higher number over a lower number. Ideally, your blood pressure should be below 120/80. You should know that:  The first, or top, number is called the systolic pressure. It is a measure of the pressure in your arteries as your heart beats.  The second, or bottom number, is called the diastolic pressure. It is a measure of the pressure in your arteries as the heart relaxes. Blood pressure is classified into four stages. Based on your blood pressure reading, your health care provider may use the following stages to determine what type of treatment you need, if any. Systolic pressure and diastolic pressure are measured in a unit called mmHg. Normal  Systolic pressure: below 120.  Diastolic pressure: below 80. Elevated  Systolic pressure: 120-129.  Diastolic pressure: below 80. Hypertension stage 1  Systolic pressure: 130-139.  Diastolic pressure: 80-89. Hypertension stage 2  Systolic pressure: 140 or  above.  Diastolic pressure: 90 or above. How can this condition affect me? Managing your hypertension is an important responsibility. Over time, hypertension can damage the arteries and decrease blood flow to important parts of the body, including the brain, heart, and kidneys. Having untreated or uncontrolled hypertension can lead to:  A heart attack.  A stroke.  A weakened blood vessel (aneurysm).  Heart failure.  Kidney damage.  Eye damage.  Metabolic syndrome.  Memory and concentration problems.  Vascular dementia. What actions can I take to manage this condition? Hypertension can be managed by making lifestyle changes and possibly by taking medicines. Your health care provider will help you make a plan to bring your blood pressure within a normal range. Nutrition  Eat a diet that is high in fiber and potassium, and low in salt (sodium), added sugar, and fat. An example eating plan is called the Dietary Approaches to Stop Hypertension (DASH) diet. To eat this way: ? Eat plenty of fresh fruits and vegetables. Try to fill one-half of your plate at each meal with fruits and vegetables. ? Eat whole grains, such as whole-wheat pasta, brown rice, or whole-grain bread. Fill about one-fourth of your plate with whole grains. ? Eat low-fat dairy products. ? Avoid fatty cuts of meat, processed or cured meats, and poultry with skin. Fill about one-fourth of your plate with lean proteins such as fish, chicken without skin, beans, eggs, and tofu. ? Avoid pre-made and processed foods. These tend to be higher in sodium, added sugar, and fat.  Reduce your daily sodium intake. Most people with hypertension should eat less than 1,500 mg of sodium a day.   Lifestyle  Work with your health care provider to maintain a healthy body weight or to lose weight. Ask what an ideal weight is for you.  Get at least 30 minutes of exercise that causes your heart to beat faster (aerobic exercise) most days  of the week. Activities may include walking, swimming, or biking.  Include exercise to strengthen your muscles (resistance exercise), such as weight lifting, as part of your weekly exercise routine. Try to do these types of exercises for 30 minutes at least 3 days a week.  Do not use any products that contain nicotine or tobacco, such as cigarettes, e-cigarettes, and chewing tobacco. If you need help quitting, ask your health care provider.  Control any long-term (chronic) conditions you have, such as high cholesterol or diabetes.  Identify your sources of stress and find ways to manage stress. This may include meditation, deep breathing, or making time for fun activities.   Alcohol use  Do not drink alcohol if: ? Your health care provider tells you not to drink. ? You are  pregnant, may be pregnant, or are planning to become pregnant.  If you drink alcohol: ? Limit how much you use to:  0-1 drink a day for women.  0-2 drinks a day for men. ? Be aware of how much alcohol is in your drink. In the [place] one drink equals one 12 oz bottle of beer (355 mL), one 5 oz glass of wine (148 mL), or one 1 oz glass of hard liquor (44 mL). Medicines Your health care provider may prescribe medicine if lifestyle changes are not enough to get your blood pressure under control and if:  Your systolic blood pressure is 130 or higher.  Your diastolic blood pressure is 80 or higher. Take medicines only as told by your health care provider. Follow the directions carefully. Blood pressure medicines must be taken as told by your health care provider. The medicine does not work as well when you skip doses. Skipping doses also puts you at risk for problems. Monitoring Before you monitor your blood pressure:  Do not smoke, drink caffeinated beverages, or exercise within 30 minutes before taking a measurement.  Use the bathroom and empty your bladder (urinate).  Sit quietly for at least 5 minutes before  taking measurements. Monitor your blood pressure at home as told by your health care provider. To do this:  Sit with your back straight and supported.  Place your feet flat on the floor. Do not cross your legs.  Support your arm on a flat surface, such as a table. Make sure your upper arm is at heart level.  Each time you measure, take two or three readings one minute apart and record the results. You may also need to have your blood pressure checked regularly by your health care provider.   General information  Talk with your health care provider about your diet, exercise habits, and other lifestyle factors that may be contributing to hypertension.  Review all the medicines you take with your health care provider because there may be side effects or interactions.  Keep all visits as told by your health care provider. Your health care provider can help you create and adjust your plan for managing your high blood pressure. Where to find more information  National Heart, Lung, and Blood Institute: PopSteam.is  American Heart Association: www.heart.org Contact a health care provider if:  You think you are having a reaction to medicines you have taken.  You have repeated (recurrent) headaches.  You feel dizzy.  You have swelling in your ankles.  You have trouble with your vision. Get help right away if:  You develop a severe headache or confusion.  You have unusual weakness or numbness, or you feel faint.  You have severe pain in your chest or abdomen.  You vomit repeatedly.  You have trouble breathing. These symptoms may represent a serious problem that is an emergency. Do not wait to see if the symptoms will go away. Get medical help right away. Call your local emergency services (911 in the [place] Do not drive yourself to the hospital. Summary  Hypertension is when the force of blood pumping through your arteries is too strong. If this condition is not controlled,  it may put you at risk for serious complications.  Your personal target blood pressure may vary depending on your medical conditions, your age, and other factors. For most people, a normal blood pressure is less than 120/80.  Hypertension is managed by lifestyle changes, medicines, or both.  Lifestyle changes to help  manage hypertension include losing weight, eating a healthy, low-sodium diet, exercising more, stopping smoking, and limiting alcohol. This information is not intended to replace advice given to you by your health care provider. Make sure you discuss any questions you have with your health care provider. Document Revised: 06/21/2019 Document Reviewed: 04/16/2019 Elsevier Patient Education  2021 [employer] Inc.    Colonoscopy, Adult A colonoscopy is a procedure to look at the entire large intestine. This procedure is done using a long, thin, flexible tube that has a camera on the end. You may have a colonoscopy:  As a part of normal colorectal screening.  If you have certain symptoms, such as: ? A low number of red blood cells in your blood (anemia). ? Diarrhea that does not go away. ? Pain in your abdomen. ? Blood in your stool. A colonoscopy can help screen for and diagnose medical problems, including:  Tumors.  Extra tissue that grows where mucus forms (polyps).  Inflammation.  Areas of bleeding. Tell your health care provider about:  Any allergies you have.  All medicines you are taking, including vitamins, herbs, eye drops, creams, and over-the-counter medicines.  Any problems you or family members have had with anesthetic medicines.  Any blood disorders you have.  Any surgeries you have had.  Any medical conditions you have.  Any problems you have had with having bowel movements.  Whether you are pregnant or may be pregnant. What are the risks? Generally, this is a safe procedure. However, problems may occur, including:  Bleeding.  Damage to  your intestine.  Allergic reactions to medicines given during the procedure.  Infection. This is rare. What happens before the procedure? Eating and drinking restrictions Follow instructions from your health care provider about eating or drinking restrictions, which may include:  A few days before the procedure: ? Follow a low-fiber diet. ? Avoid nuts, seeds, dried fruit, raw fruits, and vegetables.  1-3 days before the procedure: ? Eat only gelatin dessert or ice pops. ? Drink only clear liquids, such as water, clear juice, clear broth or bouillon, black coffee or tea, or clear soft drinks or sports drinks. ? Avoid liquids that contain red or purple dye.  The day of the procedure: ? Do not eat solid foods. You may continue to drink clear liquids until up to 2 hours before the procedure. ? Do not eat or drink anything starting 2 hours before the procedure, or within the time period that your health care provider recommends. Bowel prep If you were prescribed a bowel prep to take by mouth (orally) to clean out your colon:  Take it as told by your health care provider. Starting the day before your procedure, you will need to drink a large amount of liquid medicine. The liquid will cause you to have many bowel movements of loose stool until your stool becomes almost clear or light green.  If your skin or the opening between the buttocks (anus) gets irritated from diarrhea, you may relieve the irritation using: ? Wipes with medicine in them, such as adult wet wipes with aloe and vitamin E. ? A product to soothe skin, such as petroleum jelly.  If you vomit while drinking the bowel prep: ? Take a break for up to 60 minutes. ? Begin the bowel prep again. ? Call your health care provider if you keep vomiting or you cannot take the bowel prep without vomiting.  To clean out your colon, you may also be given: ? Laxative medicines.  These help you have a bowel movement. ? Instructions for  enema use. An enema is liquid medicine injected into your rectum. Medicines Ask your health care provider about:  Changing or stopping your regular medicines or supplements. This is especially important if you are taking iron supplements, diabetes medicines, or blood thinners.  Taking medicines such as aspirin and ibuprofen. These medicines can thin your blood. Do not take these medicines unless your health care provider tells you to take them.  Taking over-the-counter medicines, vitamins, herbs, and supplements. General instructions  Ask your health care provider what steps will be taken to help prevent infection. These may include washing skin with a germ-killing soap.  Plan to have someone take you home from the hospital or clinic. What happens during the procedure?  An IV will be inserted into one of your veins.  You may be given one or more of the following: ? A medicine to help you relax (sedative). ? A medicine to numb the area (local anesthetic). ? A medicine to make you fall asleep (general anesthetic). This is rarely needed.  You will lie on your side with your knees bent.  The tube will: ? Have oil or gel put on it (be lubricated). ? Be inserted into your anus. ? Be gently eased through all parts of your large intestine.  Air will be sent into your colon to keep it open. This may cause some pressure or cramping.  Images will be taken with the camera and will appear on a screen.  A small tissue sample may be removed to be looked at under a microscope (biopsy). The tissue may be sent to a lab for testing if any signs of problems are found.  If small polyps are found, they may be removed and checked for cancer cells.  When the procedure is finished, the tube will be removed. The procedure may vary among health care providers and hospitals.   What happens after the procedure?  Your blood pressure, heart rate, breathing rate, and blood oxygen level will be monitored  until you leave the hospital or clinic.  You may have a small amount of blood in your stool.  You may pass gas and have mild cramping or bloating in your abdomen. This is caused by the air that was used to open your colon during the exam.  Do not drive for 24 hours after the procedure.  It is up to you to get the results of your procedure. Ask your health care provider, or the department that is doing the procedure, when your results will be ready. Summary  A colonoscopy is a procedure to look at the entire large intestine.  Follow instructions from your health care provider about eating and drinking before the procedure.  If you were prescribed an oral bowel prep to clean out your colon, take it as told by your health care provider.  During the colonoscopy, a flexible tube with a camera on its end is inserted into the anus and then passed into the other parts of the large intestine. This information is not intended to replace advice given to you by your health care provider. Make sure you discuss any questions you have with your health care provider. Document Revised: 12/07/2018 Document Reviewed: 12/07/2018 Elsevier Patient Education  2021 [employer] Inc.  Prediabetes Eating Plan Prediabetes is a condition that causes blood sugar (glucose) levels to be higher than normal. This increases the risk for developing type 2 diabetes (type 2 diabetes mellitus).  Working with a health care provider or nutrition specialist (dietitian) to make diet and lifestyle changes can help prevent the onset of diabetes. These changes may help you: Control your blood glucose levels. Improve your cholesterol levels. Manage your blood pressure. What are tips for following this plan? Reading food labels Read food labels to check the amount of fat, salt (sodium), and sugar in prepackaged foods. Avoid foods that have: Saturated fats. Trans fats. Added sugars. Avoid foods that have more than 300 milligrams (mg)  of sodium per serving. Limit your sodium intake to less than 2,300 mg each day. Shopping Avoid buying pre-made and processed foods. Avoid buying drinks with added sugar. Cooking Cook with olive oil. Do not use butter, lard, or ghee. Bake, broil, grill, steam, or boil foods. Avoid frying. Meal planning Work with your dietitian to create an eating plan that is right for you. This may include tracking how many calories you take in each day. Use a food diary, notebook, or mobile application to track what you eat at each meal. Consider following a Mediterranean diet. This includes: Eating several servings of fresh fruits and vegetables each day. Eating fish at least twice a week. Eating one serving each day of whole grains, beans, nuts, and seeds. Using olive oil instead of other fats. Limiting alcohol. Limiting red meat. Using nonfat or low-fat dairy products. Consider following a plant-based diet. This includes dietary choices that focus on eating mostly vegetables and fruit, grains, beans, nuts, and seeds. If you have high blood pressure, you may need to limit your sodium intake or follow a diet such as the DASH (Dietary Approaches to Stop Hypertension) eating plan. The DASH diet aims to lower high blood pressure.   Lifestyle Set weight loss goals with help from your health care team. It is recommended that most people with prediabetes lose 7% of their body weight. Exercise for at least 30 minutes 5 or more days a week. Attend a support group or seek support from a mental health counselor. Take over-the-counter and prescription medicines only as told by your health care provider. What foods are recommended? Fruits Berries. Bananas. Apples. Oranges. Grapes. Papaya. Mango. Pomegranate. Kiwi. Grapefruit. Cherries. Vegetables Lettuce. Spinach. Peas. Beets. Cauliflower. Cabbage. Broccoli. Carrots. Tomatoes. Squash. Eggplant. Herbs. Peppers. Onions. Cucumbers. Brussels sprouts. Grains Whole  grains, such as whole-wheat or whole-grain breads, crackers, cereals, and pasta. Unsweetened oatmeal. Bulgur. Barley. Quinoa. Brown rice. Corn or whole-wheat flour tortillas or taco shells. Meats and other proteins Seafood. Poultry without skin. Lean cuts of pork and beef. Tofu. Eggs. Nuts. Beans. Dairy Low-fat or fat-free dairy products, such as yogurt, cottage cheese, and cheese. Beverages Water. Tea. Coffee. Sugar-free or diet soda. Seltzer water. Low-fat or nonfat milk. Milk alternatives, such as soy or almond milk. Fats and oils Olive oil. Canola oil. Sunflower oil. Grapeseed oil. Avocado. Walnuts. Sweets and desserts Sugar-free or low-fat pudding. Sugar-free or low-fat ice cream and other frozen treats. Seasonings and condiments Herbs. Sodium-free spices. Mustard. Relish. Low-salt, low-sugar ketchup. Low-salt, low-sugar barbecue sauce. Low-fat or fat-free mayonnaise. The items listed above may not be a complete list of recommended foods and beverages. Contact a dietitian for more information. What foods are not recommended? Fruits Fruits canned with syrup. Vegetables Canned vegetables. Frozen vegetables with butter or cream sauce. Grains Refined white flour and flour products, such as bread, pasta, snack foods, and cereals. Meats and other proteins Fatty cuts of meat. Poultry with skin. Breaded or fried meat. Processed meats. Dairy Full-fat yogurt, cheese,  or milk. Beverages Sweetened drinks, such as iced tea and soda. Fats and oils Butter. Lard. Ghee. Sweets and desserts Baked goods, such as cake, cupcakes, pastries, cookies, and cheesecake. Seasonings and condiments Spice mixes with added salt. Ketchup. Barbecue sauce. Mayonnaise. The items listed above may not be a complete list of foods and beverages that are not recommended. Contact a dietitian for more information. Where to find more information American Diabetes Association: www.diabetes.org Summary You may need to  make diet and lifestyle changes to help prevent the onset of diabetes. These changes can help you control blood sugar, improve cholesterol levels, and manage blood pressure. Set weight loss goals with help from your health care team. It is recommended that most people with prediabetes lose 7% of their body weight. Consider following a Mediterranean diet. This includes eating plenty of fresh fruits and vegetables, whole grains, beans, nuts, seeds, fish, and low-fat dairy, and using olive oil instead of other fats. This information is not intended to replace advice given to you by your health care provider. Make sure you discuss any questions you have with your health care provider. Document Revised: 08/15/2019 Document Reviewed: 08/15/2019 Elsevier Patient Education  2021 [employer].

## 2020-10-01 NOTE — Telephone Encounter (Signed)
PA for Abilify 15 mg tablets submitted to [employer] for review. Awaiting determination.

## 2020-10-01 NOTE — BH Specialist Note (Addendum)
Integrated Behavioral Health Follow Up In-Person Visit  MRN: [id number] Name: Rion Schnitzer  Number of Integrated Behavioral Health Clinician visits: 37/40 Session Start time: 11:50  Session End time: 12:25 Total time: 35  minutes  Types of Service: Individual psychotherapy  Interpretor:No. Interpretor Name and Language: none  Subjective: Freda Jaquith is a 50 y.o. female accompanied by self Patient was referred by Raliegh Ip, NP for depression and anxiety Patient reports the following symptoms/concerns: depression and anxiety Duration of problem: approx 30 years; Severity of problem: severe  Objective: Mood: Euthymic and Affect: Appropriate Risk of harm to self or others: No plan to harm self or others  Patient and/or Family's Strengths/Protective Factors: Physical Health (exercise, healthy diet, medication compliance, etc.) and empathy, religious beliefs  Goals Addressed: Patient will: 1.  Reduce symptoms of: anxiety and depression  2.  Increase knowledge and/or ability of: coping skills and self-management skills  3.  Demonstrate ability to: Increase healthy adjustment to current life circumstances and Improve medication compliance  Progress towards Goals: Ongoing  Interventions: Interventions utilized:  Mindfulness or Relaxation Training and Supportive Counseling Standardized Assessments completed: Not Needed   Supportive counseling today and reviewed medications patient brought with her. Patient sometimes uses her pill organizing box. Patient has switched her pharmacy to one nearer her daughter's home, as she's staying with daughter temporarily.  Supportive counseling and reflection on patient's progress. Patient reports she continues to limit interactions with her sister. Validation of emotions around this. Encouraged journaling about thoughts and emotions, as patient feeling a loss or missing out on positive relationship with sister.    Grounding neuromuscular  integration/mindful movement teaching today. Patient participated in the activity and reported a calming benefit.  Patient and/or Family Response: Patient engaged in session. Patient reports increased medication adherence and reports a benefit from medications prescribed by psychiatry.  Patient Centered Plan: Patient is on the following Treatment Plan(s): Anxiety and Depression  Assessment: Patient currently experiencing anxiety,depression,schizophrenia and PTSD.Patient experiencing negative thoughts about self in context of these challenges.Patient also experiencing challenges navigating systems to access resources.  Patient may benefit from continued therapy including CBT to explore unhelpful thoughts about self in context of family dynamics,andmindfulness for coping with elevated emotions.  Plan: 1. Follow up with behavioral health clinician on: 10/08/20 2. Behavioral recommendations: mindfulness, journaling 3. Referral(s): Integrated [employer] (In Clinic)  Abigail Butts, LCSW  [facility] [phone]
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
## 2020-10-01 NOTE — Progress Notes (Signed)
[facility] Patient [facility] [address] [place], [place]  [address] Phone:  [phone]   Fax:  [phone]   Established Patient Office Visit  Subjective:  Patient ID: Theresa Olson, female    DOB: 02/13/1971  Age: 50 y.o. MRN: [id number]  CC:  Chief Complaint  Patient presents with  . Follow-up    Folllow up , she is requesting lab per her pysc    HPI Theresa Olson presents for follow up. She  has a past medical history of Anxiety, Anxiety disorder, Carpal tunnel syndrome, Depression, Hot flashes, Hyperlipidemia (10/2019), Hypertension, Insomnia, and Vitamin D deficiency (12/2018).   Hypertension Patient is here for follow-up of elevated blood pressure. She is not exercising and is adherent to a low-salt diet. Blood pressure is not monitored at home. Cardiac symptoms: none. Patient denies chest pain, chest pressure/discomfort, claudication, dyspnea, exertional chest pressure/discomfort, fatigue, irregular heart beat, lower extremity edema, near-syncope, orthopnea, palpitations, paroxysmal nocturnal dyspnea, syncope and tachypnea. Cardiovascular risk factors: hypertension, obesity (BMI >= 30 kg/m2) and sedentary lifestyle. Use of agents associated with hypertension: none. History of target organ damage: none.  Past Medical History:  Diagnosis Date  . Anxiety   . Anxiety disorder   . Carpal tunnel syndrome   . Depression   . Hot flashes   . Hyperlipidemia 10/2019  . Hypertension   . Insomnia   . Vitamin D deficiency 12/2018    Past Surgical History:  Procedure Laterality Date  . CESAREAN SECTION      Family History  Problem Relation Age of Onset  . Hyperlipidemia Mother   . Diabetes Mother   . Hyperlipidemia Father   . Diabetes Father   . Heart disease Father     Social History   Socioeconomic History  . Marital status: Single    Spouse name: Not on file  . Number of children: Not on file  . Years of education: Not on file  . Highest education  level: Not on file  Occupational History  . Not on file  Tobacco Use  . Smoking status: Former Smoker    Types: Cigarettes    Quit date: 08/28/2016    Years since quitting: 4.0  . Smokeless tobacco: Never Used  Substance and Sexual Activity  . Alcohol use: Yes    Comment: 2-3 glasses of vodka per day  . Drug use: Yes    Types: Marijuana    Comment: smokes every few days  . Sexual activity: Yes    Birth control/protection: None  Other Topics Concern  . Not on file  Social History Narrative  . Not on file   Social Determinants of Health   Financial Resource Strain: Not on file  Food Insecurity: Not on file  Transportation Needs: Not on file  Physical Activity: Not on file  Stress: Not on file  Social Connections: Not on file  Intimate Partner Violence: Not on file    Outpatient Medications Prior to Visit  Medication Sig Dispense Refill  . ALPRAZolam (XANAX) 1 MG tablet Take 1 tablet (1 mg total) by mouth at bedtime as needed for anxiety. 30 tablet 0  . ARIPiprazole (ABILIFY) 15 MG tablet Take 0.5 tablets (7.5 mg total) by mouth daily. 15 tablet 1  . DULoxetine (CYMBALTA) 60 MG capsule Take one capsule daily 30 capsule 1  . hydrOXYzine (ATARAX/VISTARIL) 25 MG tablet Take 1 tablet (25 mg total) by mouth 3 (three) times daily as needed. 90 tablet 1  . Methylsulfonylmethane (MSM) 1000  MG CAPS Take 1,000 mg by mouth daily.    . vitamin C (ASCORBIC ACID) 500 MG tablet Take 500 mg by mouth every morning.    Marland Kitchen amLODipine (NORVASC) 5 MG tablet Take 1 tablet (5 mg total) by mouth daily. 90 tablet 3  . atorvastatin (LIPITOR) 10 MG tablet Take 1 tablet (10 mg total) by mouth daily. 90 tablet 3  . Vitamin D, Ergocalciferol, (DRISDOL) 1.25 MG (50000 UNIT) CAPS capsule TAKE 1 CAPSULE (50,000 UNITS TOTAL) BY MOUTH EVERY 7 (SEVEN) DAYS. 5 capsule 6  . GLUTATHIONE PO Take 1 tablet by mouth daily.    Marland Kitchen gabapentin (NEURONTIN) 300 MG capsule Take 1 capsule (300 mg total) by mouth 3 (three) times  daily. (Patient not taking: Reported on 08/20/2020) 90 capsule 6  . PARoxetine (PAXIL) 40 MG tablet TAKE 1 TABLET BY MOUTH EVERY DAY IN THE MORNING (Patient not taking: Reported on 08/20/2020) 90 tablet 3  . pregabalin (LYRICA) 50 MG capsule Take 1 capsule (50 mg total) by mouth 2 (two) times daily. (Patient not taking: Reported on 07/30/2020) 60 capsule 6   No facility-administered medications prior to visit.    Allergies  Allergen Reactions  . Zoloft [Sertraline Hcl] Swelling and Hypertension    ROS Review of Systems  Respiratory: Negative for chest tightness.   Cardiovascular: Negative for chest pain and leg swelling.  Gastrointestinal: Negative for constipation and diarrhea.  Endocrine: Negative.   Genitourinary:       Last cycle 12 mon  Allergic/Immunologic: Negative.   Neurological: Negative for dizziness and headaches.  Hematological: Negative.       Objective:    Physical Exam Constitutional:      Appearance: She is obese.  HENT:     Head: Normocephalic and atraumatic.  Cardiovascular:     Rate and Rhythm: Normal rate and regular rhythm.     Pulses: Normal pulses.     Heart sounds: Normal heart sounds.  Pulmonary:     Effort: Pulmonary effort is normal.     Breath sounds: Normal breath sounds.  Abdominal:     Palpations: Abdomen is soft.     Comments: Increased abdominal girth   Musculoskeletal:        General: Normal range of motion.     Cervical back: Normal range of motion.  Skin:    General: Skin is warm and dry.     Capillary Refill: Capillary refill takes less than 2 seconds.  Neurological:     General: No focal deficit present.     Mental Status: She is alert and oriented to person, place, and time.     BP 123/76 (BP Location: Left Arm, Patient Position: Sitting, Cuff Size: Normal)   Pulse 76   Temp 97.9 F (36.6 C) (Temporal)   Ht 5' (1.524 m)   Wt 187 lb (84.8 kg)   LMP 04/18/2017   BMI 36.52 kg/m  Wt Readings from Last 3 Encounters:   10/01/20 187 lb (84.8 kg)  03/28/20 185 lb (83.9 kg)  12/09/19 185 lb 12.8 oz (84.3 kg)     Health Maintenance Due  Topic Date Due  . MAMMOGRAM  Never done    There are no preventive care reminders to display for this patient.  Lab Results  Component Value Date   TSH 1.050 11/08/2019   Lab Results  Component Value Date   WBC 9.5 03/28/2020   HGB 13.1 03/28/2020   HCT 39.4 03/28/2020   MCV 92.1 03/28/2020   PLT 259  03/28/2020   Lab Results  Component Value Date   NA 137 03/28/2020   K 3.4 (L) 03/28/2020   CO2 25 03/28/2020   GLUCOSE 115 (H) 03/28/2020   BUN 16 03/28/2020   CREATININE 0.85 03/28/2020   BILITOT 0.9 03/28/2020   ALKPHOS 78 03/28/2020   AST 27 03/28/2020   ALT 27 03/28/2020   PROT 8.1 03/28/2020   ALBUMIN 4.1 03/28/2020   CALCIUM 9.3 03/28/2020   ANIONGAP 10 03/28/2020   Lab Results  Component Value Date   CHOL 232 (H) 10/01/2020   Lab Results  Component Value Date   HDL 57 11/08/2019   Lab Results  Component Value Date   LDLCALC 163 (H) 11/08/2019   Lab Results  Component Value Date   TRIG 186 (H) 11/08/2019   Lab Results  Component Value Date   CHOLHDL 4.5 (H) 11/08/2019   Lab Results  Component Value Date   HGBA1C 5.7 (A) 01/05/2018      Assessment & Plan:   Problem List Items Addressed This Visit   None   Visit Diagnoses    Prediabetes    -  Primary Consider home glucose monitoring Weight loss at least 5% of current body weight is can be achieved with lifestyle modification dietary changes and regular daily exercise Encourage blood pressure control goal <120/80 and maintaining total cholesterol <200 Follow-up every 3 to 6 months for reevaluation Education material provided    Relevant Orders   Comp. Metabolic Panel (12)   POCT glycosylated hemoglobin (Hb A1C)   Hyperlipidemia, unspecified hyperlipidemia type       Relevant Medications   amLODipine (NORVASC) 5 MG tablet   atorvastatin (LIPITOR) 10 MG tablet    Other Relevant Orders   Lipid panel   Primary hypertension     Encouraged on going compliance with current medication regimen Encouraged home monitoring and recording BP <130/80 Eating a heart-healthy diet with less salt Encouraged regular physical activity  Recommend Weight loss   Relevant Medications   amLODipine (NORVASC) 5 MG tablet   atorvastatin (LIPITOR) 10 MG tablet   Other Relevant Orders   Comp. Metabolic Panel (12)   Hypertensive urgency   Resolved     Relevant Medications   amLODipine (NORVASC) 5 MG tablet   atorvastatin (LIPITOR) 10 MG tablet      Meds ordered this encounter  Medications  . amLODipine (NORVASC) 5 MG tablet    Sig: Take 1 tablet (5 mg total) by mouth daily.    Dispense:  90 tablet    Refill:  3    Order Specific Question:   Supervising Provider    Answer:   Quentin Angst [id number]  . atorvastatin (LIPITOR) 10 MG tablet    Sig: Take 1 tablet (10 mg total) by mouth daily.    Dispense:  90 tablet    Refill:  3    Order Specific Question:   Supervising Provider    Answer:   Quentin Angst [id number]    Follow-up: Return in about 3 months (around 01/01/2021) for Physcial VISIT,EST,40-64 [99396] and 6 months for HTN 02409.    Barbette Merino, NP

## 2020-10-02 LAB — COMP. METABOLIC PANEL (12)
AST: 26 IU/L (ref 0–40)
Albumin/Globulin Ratio: 1.4 (ref 1.2–2.2)
Albumin: 4.2 g/dL (ref 3.8–4.8)
Alkaline Phosphatase: 101 IU/L (ref 44–121)
BUN/Creatinine Ratio: 10 (ref 9–23)
BUN: 9 mg/dL (ref 6–24)
Bilirubin Total: 0.2 mg/dL (ref 0.0–1.2)
Calcium: 9.2 mg/dL (ref 8.7–10.2)
Chloride: 104 mmol/L (ref 96–106)
Creatinine, Ser: 0.88 mg/dL (ref 0.57–1.00)
Globulin, Total: 2.9 g/dL (ref 1.5–4.5)
Glucose: 124 mg/dL — ABNORMAL HIGH (ref 65–99)
Potassium: 3.7 mmol/L (ref 3.5–5.2)
Sodium: 144 mmol/L (ref 134–144)
Total Protein: 7.1 g/dL (ref 6.0–8.5)
eGFR: 80 mL/min/{1.73_m2} (ref >59)

## 2020-10-02 LAB — LIPID PANEL
Chol/HDL Ratio: 4.1 ratio (ref 0.0–4.4)
Cholesterol, Total: 232 mg/dL — ABNORMAL HIGH (ref 100–199)
HDL: 57 mg/dL (ref >39)
LDL Chol Calc (NIH): 138 mg/dL — ABNORMAL HIGH (ref 0–99)
Triglycerides: 207 mg/dL — ABNORMAL HIGH (ref 0–149)
VLDL Cholesterol Cal: 37 mg/dL (ref 5–40)

## 2020-10-08 ENCOUNTER — Other Ambulatory Visit: Payer: Self-pay

## 2020-10-08 ENCOUNTER — Ambulatory Visit (INDEPENDENT_AMBULATORY_CARE_PROVIDER_SITE_OTHER): Payer: Medicaid Other | Admitting: Clinical

## 2020-10-08 DIAGNOSIS — F419 Anxiety disorder, unspecified: Secondary | ICD-10-CM | POA: Diagnosis not present

## 2020-10-08 DIAGNOSIS — F4310 Post-traumatic stress disorder, unspecified: Secondary | ICD-10-CM

## 2020-10-08 DIAGNOSIS — F339 Major depressive disorder, recurrent, unspecified: Secondary | ICD-10-CM
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
## 2020-10-14 ENCOUNTER — Ambulatory Visit (INDEPENDENT_AMBULATORY_CARE_PROVIDER_SITE_OTHER): Payer: Medicaid Other | Admitting: Clinical

## 2020-10-14 ENCOUNTER — Other Ambulatory Visit: Payer: Self-pay

## 2020-10-14 DIAGNOSIS — F4310 Post-traumatic stress disorder, unspecified: Secondary | ICD-10-CM

## 2020-10-14 DIAGNOSIS — F419 Anxiety disorder, unspecified: Secondary | ICD-10-CM

## 2020-10-14 DIAGNOSIS — F339 Major depressive disorder, recurrent, unspecified: Secondary | ICD-10-CM

## 2020-10-14 NOTE — BH Specialist Note (Addendum)
Integrated Behavioral Health via Telemedicine Visit  10/14/2020 Ozelle Brubacher [id number]  Number of Integrated Behavioral Health visits: 39/40 Session Start time: 10:00  Session End time: 10:45 Total time: 45   Referring Provider: Raliegh Ip, NP Patient/Family location: [address] [place], [place] (daughter's home) [facility] Provider location: [facility] All persons participating in visit: CSW, patient Types of Service: Individual psychotherapy and Telephone visit  I connected with [name] via Telephone and verified that I am speaking with the correct person using two identifiers. Discussed confidentiality: Yes   I discussed the limitations of telemedicine and the availability of in person appointments.  Discussed there is a possibility of technology failure and discussed alternative modes of communication if that failure occurs.  I discussed that engaging in this telemedicine visit, they consent to the provision of behavioral healthcare and the services will be billed under their insurance.  Patient and/or legal guardian expressed understanding and consented to Telemedicine visit: Yes   Presenting Concerns: Patient and/or family reports the following symptoms/concerns: depression and anxiety Duration of problem: approx 30 years; Severity of problem: moderate  Patient and/or Family's Strengths/Protective Factors: Physical Health (exercise, healthy diet, medication compliance, etc.) and empathy, religious beliefs  Goals Addressed: Patient will: 1.  Reduce symptoms of: anxiety and depression  2.  Increase knowledge and/or ability of: healthy habits  3.  Demonstrate ability to: Increase healthy adjustment to current life circumstances  Progress towards Goals: Ongoing  Interventions: Interventions utilized:  CBT Cognitive Behavioral Therapy and Supportive Counseling Standardized Assessments completed: Not Needed   CBT and supportive counseling  today regarding patient's thoughts about self in context of relationships with family members. She was happy to hear from her youngest daughter this week. Explored some unhelpful thoughts and challenged them. Patient continues to find medication prescribed by psychiatry helpful. She is making healthy changes to her diet as well.   Patient and/or Family Response: Patient engaged in session.  Assessment: Patient currently experiencing anxiety,depression,schizophrenia and PTSD.Patient experiencing negative thoughts about self in context of these challenges.Patient also experiencing challenges navigating systems to access resources.  Patient may benefit from continued therapy including CBT to explore unhelpful thoughts about self in context of family dynamics,andmindfulness for coping with elevated emotions.  Plan: 1. Follow up with behavioral health clinician on: 10/21/20 2. Referral(s): Integrated [employer] (In Clinic)  I discussed the assessment and treatment plan with the patient and/or parent/guardian. They were provided an opportunity to ask questions and all were answered. They agreed with the plan and demonstrated an understanding of the instructions.   They were advised to call back or seek an in-person evaluation if the symptoms worsen or if the condition fails to improve as anticipated.  Abigail Butts, LCSW  [facility] [phone]

## 2020-10-21 ENCOUNTER — Other Ambulatory Visit: Payer: Self-pay

## 2020-10-21 ENCOUNTER — Ambulatory Visit (INDEPENDENT_AMBULATORY_CARE_PROVIDER_SITE_OTHER): Payer: Medicaid Other | Admitting: Clinical

## 2020-10-21 DIAGNOSIS — F4310 Post-traumatic stress disorder, unspecified: Secondary | ICD-10-CM

## 2020-10-21 DIAGNOSIS — F419 Anxiety disorder, unspecified: Secondary | ICD-10-CM | POA: Diagnosis not present

## 2020-10-21 DIAGNOSIS — F339 Major depressive disorder, recurrent, unspecified: Secondary | ICD-10-CM

## 2020-10-21 NOTE — BH Specialist Note (Addendum)
Integrated Behavioral Health via Telemedicine Visit  10/21/2020 Theresa Olson [id number]  Number of Integrated Behavioral Health visits: 40/50 Session Start time: 1:00  Session End time: 2:00 Total time: 60  Referring Provider: Raliegh Ip, NP Patient/Family location: [address] [place], [place] (daughter's home) [facility] Provider location: [facility] All persons participating in visit: CSW, patient Types of Service: Individual psychotherapy and Telephone visit  I connected with Theresa Olson via Telephone and verified that I am speaking with the correct person using two identifiers. Discussed confidentiality: Yes   I discussed the limitations of telemedicine and the availability of in person appointments.  Discussed there is a possibility of technology failure and discussed alternative modes of communication if that failure occurs.  I discussed that engaging in this telemedicine visit, they consent to the provision of behavioral healthcare and the services will be billed under their insurance.  Patient and/or legal guardian expressed understanding and consented to Telemedicine visit: Yes   Presenting Concerns: Patient and/or family reports the following symptoms/concerns: depression and anxiety Duration of problem: approx 30 years; Severity of problem: moderate  Patient and/or Family's Strengths/Protective Factors: Physical Health (exercise, healthy diet, medication compliance, etc.) and empathy, religious beliefs  Goals Addressed: Patient will: 1.  Reduce symptoms of: anxiety and depression  2.  Demonstrate ability to: Increase healthy adjustment to current life circumstances  Progress towards Goals: Ongoing  Interventions: Interventions utilized:  Mindfulness or Management consultant and CBT Cognitive Behavioral Therapy Standardized Assessments completed: Not Needed   CBT to explore unhelpful thoughts related to anxiety about going out in public.  Patient wants to increase activities outside of the house but is feeling unmotivated. Discussion of behavioral activation. CBT to explore thoughts about self in context of relationship with son. Mindfulness meditation practice for shifting unhelpful thoughts.  Patient and/or Family Response: Patient engaged in session.  Assessment: Patient currently experiencing anxiety,depression,schizophrenia and PTSD.Patient experiencing negative thoughts about self in context of these challenges.Patient also experiencing challenges navigating systems to access resources.  Patient may benefit from continued therapy including CBT to explore unhelpful thoughts about self in context of family dynamics,andmindfulness for coping with elevated emotions.  Plan: 1. Follow up with behavioral health clinician on: 10/28/20 2. Referral(s): Integrated [employer] (In Clinic)  I discussed the assessment and treatment plan with the patient and/or parent/guardian. They were provided an opportunity to ask questions and all were answered. They agreed with the plan and demonstrated an understanding of the instructions.   They were advised to call back or seek an in-person evaluation if the symptoms worsen or if the condition fails to improve as anticipated.  Abigail Butts, LCSW  [facility] [phone]

## 2020-10-28 ENCOUNTER — Ambulatory Visit (INDEPENDENT_AMBULATORY_CARE_PROVIDER_SITE_OTHER): Payer: Medicaid Other | Admitting: Clinical

## 2020-10-28 ENCOUNTER — Other Ambulatory Visit: Payer: Self-pay

## 2020-10-28 DIAGNOSIS — F339 Major depressive disorder, recurrent, unspecified: Secondary | ICD-10-CM

## 2020-10-28 DIAGNOSIS — F419 Anxiety disorder, unspecified: Secondary | ICD-10-CM

## 2020-10-28 DIAGNOSIS — F4310 Post-traumatic stress disorder, unspecified: Secondary | ICD-10-CM

## 2020-10-28 NOTE — BH Specialist Note (Addendum)
Integrated Behavioral Health via Telemedicine Visit  10/28/2020 Theresa Olson [id number]  Number of Integrated Behavioral Health visits: 41/50 Session Start time: 2:00  Session End time: 2:45 Total time: 45   Referring Provider: Raliegh Ip, NP Patient/Family location: [address] [place], [place] (daughter's home) [facility] Provider location: [facility] All persons participating in visit: CSW, patient Types of Service: Individual psychotherapy and Telephone visit  I connected with Theresa Olson via Telephone and verified that I am speaking with the correct person using two identifiers. Discussed confidentiality: Yes   I discussed the limitations of telemedicine and the availability of in person appointments.  Discussed there is a possibility of technology failure and discussed alternative modes of communication if that failure occurs.  I discussed that engaging in this telemedicine visit, they consent to the provision of behavioral healthcare and the services will be billed under their insurance.  Patient and/or legal guardian expressed understanding and consented to Telemedicine visit: Yes   Presenting Concerns: Patient and/or family reports the following symptoms/concerns: depression and anxiety Duration of problem: approx 30 years; Severity of problem: moderate  Patient and/or Family's Strengths/Protective Factors: Physical Health (exercise, healthy diet, medication compliance, etc.) and empathy, religious beliefs  Goals Addressed: Patient will: 1.  Reduce symptoms of: anxiety and depression  2.  Demonstrate ability to: Increase healthy adjustment to current life circumstances  Progress towards Goals: Ongoing  Interventions: Interventions utilized:  Supportive Counseling Standardized Assessments completed: Not Needed   Supportive counseling today regarding patient's view of self in context of relationships with adult children and with her sister.  Reflection on patient's improvement in enforcing personal boundaries. Supportive counseling around self-care and coping with negative messages in the media. Tried to follow up with [facility] to determine patient's next psychiatry appointment but was on hold for a while. Patient reported medications from psychiatry are still working well.  Patient and/or Family Response: Patient engaged in session.  Assessment: Patient currently experiencing anxiety,depression,schizophrenia and PTSD.Patient experiencing negative thoughts about self in context of these challenges.Patient also experiencing challenges navigating systems to access resources.  Patient may benefit from continued therapy including CBT to explore unhelpful thoughts about self in context of family dynamics,andmindfulness for coping with elevated emotions.  Plan: 1. Follow up with behavioral health clinician on: 11/04/20 2. Referral(s): Integrated [employer] (In Clinic)  I discussed the assessment and treatment plan with the patient and/or parent/guardian. They were provided an opportunity to ask questions and all were answered. They agreed with the plan and demonstrated an understanding of the instructions.   They were advised to call back or seek an in-person evaluation if the symptoms worsen or if the condition fails to improve as anticipated.  Abigail Butts, LCSW  [facility] [phone]

## 2020-11-04 ENCOUNTER — Encounter: Payer: Self-pay | Admitting: Clinical

## 2020-11-06 ENCOUNTER — Other Ambulatory Visit: Payer: Self-pay

## 2020-11-06 ENCOUNTER — Ambulatory Visit (INDEPENDENT_AMBULATORY_CARE_PROVIDER_SITE_OTHER): Payer: Medicaid Other | Admitting: Clinical

## 2020-11-06 DIAGNOSIS — F419 Anxiety disorder, unspecified: Secondary | ICD-10-CM

## 2020-11-06 DIAGNOSIS — F4310 Post-traumatic stress disorder, unspecified: Secondary | ICD-10-CM

## 2020-11-06 DIAGNOSIS — F339 Major depressive disorder, recurrent, unspecified: Secondary | ICD-10-CM

## 2020-11-06 NOTE — BH Specialist Note (Addendum)
Integrated Behavioral Health via Telemedicine Visit  11/06/2020 Theresa Olson [id number]  Number of Integrated Behavioral Health visits: 42/50 Session Start time: 1:40  Session End time: 2:40 Total time: 60  Referring Provider: Raliegh Ip, NP Patient/Family location: [name], [place] (daughter's home) [facility] Provider location: [facility] All persons participating in visit: CSW, patient Types of Service: Individual psychotherapy and Telephone visit  I connected with Theresa Olson via Telephone and verified that I am speaking with the correct person using two identifiers. Discussed confidentiality: Yes   I discussed the limitations of telemedicine and the availability of in person appointments.  Discussed there is a possibility of technology failure and discussed alternative modes of communication if that failure occurs.  I discussed that engaging in this telemedicine visit, they consent to the provision of behavioral healthcare and the services will be billed under their insurance.  Patient and/or legal guardian expressed understanding and consented to Telemedicine visit: Yes   Presenting Concerns: Patient and/or family reports the following symptoms/concerns: depression and anxiety Duration of problem: approx 30 years; Severity of problem: moderate  Patient and/or Family's Strengths/Protective Factors: Physical Health (exercise, healthy diet, medication compliance, etc.) and empathy, religious beliefs  Goals Addressed: Patient will:  Reduce symptoms of: anxiety and depression   Demonstrate ability to: Increase healthy adjustment to current life circumstances  Progress towards Goals: Ongoing  Interventions: Interventions utilized:  Supportive Counseling Standardized Assessments completed: Not Needed   Supportive counseling today regarding how patient is coping with difficult family dynamics. Patient reports an improved mood this week  and is managing her medications well. She continues to try to focus on more helpful thoughts and exhibits increased ability to notice her own worth despite unhealthy family patterns. Reflection on generational trauma and how it affects a family.     Patient and/or Family Response: Patient engaged in session.  Assessment: Patient currently experiencing anxiety, depression, schizophrenia and PTSD. Patient experiencing negative thoughts about self in context of these challenges. Patient also experiencing challenges navigating systems to access resources.   Patient may benefit from continued therapy including CBT to explore unhelpful thoughts about self in context of family dynamics, and mindfulness for coping with elevated emotions.  Plan Follow up with behavioral health clinician on: 11/11/20 Referral(s): Integrated [employer] (In Clinic)  I discussed the assessment and treatment plan with the patient and/or parent/guardian. They were provided an opportunity to ask questions and all were answered. They agreed with the plan and demonstrated an understanding of the instructions.   They were advised to call back or seek an in-person evaluation if the symptoms worsen or if the condition fails to improve as anticipated.  Abigail Butts, LCSW  [facility] [phone]

## 2020-11-11 ENCOUNTER — Encounter: Payer: Self-pay | Admitting: Clinical

## 2020-11-18 ENCOUNTER — Other Ambulatory Visit: Payer: Self-pay

## 2020-11-18 ENCOUNTER — Ambulatory Visit (INDEPENDENT_AMBULATORY_CARE_PROVIDER_SITE_OTHER): Payer: Medicaid Other | Admitting: Clinical

## 2020-11-18 DIAGNOSIS — F4310 Post-traumatic stress disorder, unspecified: Secondary | ICD-10-CM

## 2020-11-18 DIAGNOSIS — F419 Anxiety disorder, unspecified: Secondary | ICD-10-CM | POA: Diagnosis not present

## 2020-11-18 DIAGNOSIS — F339 Major depressive disorder, recurrent, unspecified: Secondary | ICD-10-CM

## 2020-11-18 NOTE — BH Specialist Note (Addendum)
Integrated Behavioral Health via Telemedicine Visit  11/18/2020 Theresa Olson [id number]  Number of Integrated Behavioral Health visits: 43/50 Session Start time: 2:00  Session End time: 2:45 Total time: 45   Referring Provider: Raliegh Ip, NP Patient/Family location: [address] [place], [place] (daughter's home) [facility] Provider location: [facility] All persons participating in visit: CSW, patient Types of Service: Individual psychotherapy and Telephone visit  I connected with Theresa Olson via Telephone and verified that I am speaking with the correct person using two identifiers. Discussed confidentiality: Yes   I discussed the limitations of telemedicine and the availability of in person appointments.  Discussed there is a possibility of technology failure and discussed alternative modes of communication if that failure occurs.  I discussed that engaging in this telemedicine visit, they consent to the provision of behavioral healthcare and the services will be billed under their insurance.  Patient and/or legal guardian expressed understanding and consented to Telemedicine visit: Yes   Presenting Concerns: Patient and/or family reports the following symptoms/concerns: depression and anxiety Duration of problem: approx 30 years; Severity of problem: moderate  Patient and/or Family's Strengths/Protective Factors: Physical Health (exercise, healthy diet, medication compliance, etc.) and empathy, religious beliefs  Goals Addressed: Patient will:  Reduce symptoms of: anxiety and depression   Demonstrate ability to: Increase healthy adjustment to current life circumstances  Progress towards Goals: Ongoing  Interventions: Interventions utilized:  Supportive Counseling Standardized Assessments completed: Not Needed   Supportive counseling today regarding anxiety related to going out in public. Also exploration of boundaries and values in  interpersonal interactions. Validation of emotions and discussion about behaviors and actions related to emotions.    Patient and/or Family Response: Patient engaged in session. Patient exhibits increased insight into connection of emotions and behaviors. Patient exhibits improved ability to regulate emotions and make safe decisions about behavior.  Assessment: Patient currently experiencing anxiety, depression, schizophrenia and PTSD. Patient experiencing negative thoughts about self in context of these challenges. Patient also experiencing challenges navigating systems to access resources.   Patient may benefit from continued therapy including CBT to explore unhelpful thoughts about self in context of family dynamics, and mindfulness for coping with elevated emotions.  Plan Follow up with behavioral health clinician on: 11/25/20 Referral(s): Integrated [employer] (In Clinic)  I discussed the assessment and treatment plan with the patient and/or parent/guardian. They were provided an opportunity to ask questions and all were answered. They agreed with the plan and demonstrated an understanding of the instructions.   They were advised to call back or seek an in-person evaluation if the symptoms worsen or if the condition fails to improve as anticipated.  Theresa Butts, LCSW  [facility] [phone]

## 2020-11-25 ENCOUNTER — Other Ambulatory Visit: Payer: Self-pay

## 2020-11-25 ENCOUNTER — Ambulatory Visit (INDEPENDENT_AMBULATORY_CARE_PROVIDER_SITE_OTHER): Payer: Medicaid Other | Admitting: Clinical

## 2020-11-25 DIAGNOSIS — F419 Anxiety disorder, unspecified: Secondary | ICD-10-CM

## 2020-11-25 DIAGNOSIS — F339 Major depressive disorder, recurrent, unspecified: Secondary | ICD-10-CM

## 2020-11-25 DIAGNOSIS — F4310 Post-traumatic stress disorder, unspecified: Secondary | ICD-10-CM

## 2020-11-25 NOTE — BH Specialist Note (Addendum)
Integrated Behavioral Health via Telemedicine Visit  11/25/2020 Sukari Grist [id number]  Number of Integrated Behavioral Health visits: 43/50 Session Start time: 2:00  Session End time: 2:55 Total time: 53   Referring Provider: Raliegh Ip, NP Patient/Family location: [name], [place] (daughter's home) [facility] Provider location: [facility] All persons participating in visit: CSW, patient Types of Service: Individual psychotherapy and Telephone visit  I connected with [name] via Telephone and verified that I am speaking with the correct person using two identifiers. Discussed confidentiality: Yes   I discussed the limitations of telemedicine and the availability of in person appointments.  Discussed there is a possibility of technology failure and discussed alternative modes of communication if that failure occurs.  I discussed that engaging in this telemedicine visit, they consent to the provision of behavioral healthcare and the services will be billed under their insurance.  Patient and/or legal guardian expressed understanding and consented to Telemedicine visit: Yes   Presenting Concerns: Patient and/or family reports the following symptoms/concerns: depression and anxiety Duration of problem: approx 30 years; Severity of problem: moderate  Patient and/or Family's Strengths/Protective Factors: Physical Health (exercise, healthy diet, medication compliance, etc.) and empathy, religious beliefs  Goals Addressed: Patient will:  Reduce symptoms of: anxiety and depression   Demonstrate ability to: Increase healthy adjustment to current life circumstances  Progress towards Goals: Ongoing  Interventions: Interventions utilized:  Supportive Counseling Standardized Assessments completed: Not Needed   Supportive counseling today regarding anxiety related to family dynamics and personal values. Reflection on connection of emotions and  behaviors. Supportive reflection about patient's increased ability to enforce personal boundaries.    Patient and/or Family Response: Patient engaged in session. Patient exhibits increased insight into connection of emotions and behaviors.   Assessment: Patient currently experiencing anxiety, depression, schizophrenia and PTSD. Patient experiencing negative thoughts about self in context of these challenges. Patient also experiencing challenges navigating systems to access resources.   Patient may benefit from continued therapy including CBT to explore unhelpful thoughts about self in context of family dynamics, and mindfulness for coping with elevated emotions.  Plan Follow up with behavioral health clinician on: 12/02/20 Referral(s): Integrated [employer] (In Clinic)  I discussed the assessment and treatment plan with the patient and/or parent/guardian. They were provided an opportunity to ask questions and all were answered. They agreed with the plan and demonstrated an understanding of the instructions.   They were advised to call back or seek an in-person evaluation if the symptoms worsen or if the condition fails to improve as anticipated.  Abigail Butts, LCSW  [facility] [phone]

## 2020-12-02 ENCOUNTER — Other Ambulatory Visit: Payer: Self-pay

## 2020-12-02 ENCOUNTER — Ambulatory Visit (INDEPENDENT_AMBULATORY_CARE_PROVIDER_SITE_OTHER): Payer: Medicaid Other | Admitting: Clinical

## 2020-12-02 DIAGNOSIS — F4310 Post-traumatic stress disorder, unspecified: Secondary | ICD-10-CM

## 2020-12-02 DIAGNOSIS — F419 Anxiety disorder, unspecified: Secondary | ICD-10-CM | POA: Diagnosis not present

## 2020-12-02 DIAGNOSIS — F339 Major depressive disorder, recurrent, unspecified: Secondary | ICD-10-CM

## 2020-12-02 NOTE — BH Specialist Note (Addendum)
Integrated Behavioral Health via Telemedicine Visit  12/02/2020 Deysha Cartier [id number]  Number of Integrated Behavioral Health visits: 44/50 Session Start time: 2:00  Session End time: 2:55 Total time: 13   Referring Provider: Raliegh Ip, NP Patient/Family location: [address] [place], [place] (daughter's home) [facility] Provider location: [facility] All persons participating in visit: CSW, patient Types of Service: Individual psychotherapy and Telephone visit  I connected with [name] via Telephone and verified that I am speaking with the correct person using two identifiers. Discussed confidentiality: Yes   I discussed the limitations of telemedicine and the availability of in person appointments.  Discussed there is a possibility of technology failure and discussed alternative modes of communication if that failure occurs.  I discussed that engaging in this telemedicine visit, they consent to the provision of behavioral healthcare and the services will be billed under their insurance.  Patient and/or legal guardian expressed understanding and consented to Telemedicine visit: Yes   Presenting Concerns: Patient and/or family reports the following symptoms/concerns: depression and anxiety Duration of problem: approx 30 years; Severity of problem: moderate  Patient and/or Family's Strengths/Protective Factors: Physical Health (exercise, healthy diet, medication compliance, etc.) and empathy, religious beliefs  Goals Addressed: Patient will:  Reduce symptoms of: anxiety and depression   Demonstrate ability to: Increase healthy adjustment to current life circumstances  Progress towards Goals: Ongoing  Interventions: Interventions utilized:  Supportive Counseling and DBT Dialectal Behavioral Therapy Standardized Assessments completed: Not Needed   Supportive counseling today regarding thoughts and emotions about family dynamics. Patient  processing emotions around a family member who is involved in legal trouble. CBT to challenge some unhelpful thoughts around this. Also DBT strategy of radical acceptance for difficult family situation.        Patient and/or Family Response: Patient engaged in session. Patient exhibits increased insight into connection of emotions and behaviors.   Assessment: Patient currently experiencing anxiety, depression, schizophrenia and PTSD. Patient experiencing negative thoughts about self in context of these challenges. Patient also experiencing challenges navigating systems to access resources.   Patient may benefit from continued therapy including CBT to explore unhelpful thoughts about self in context of family dynamics, and mindfulness for coping with elevated emotions.  Plan Follow up with behavioral health clinician on: 12/09/20 Referral(s): Integrated [employer] (In Clinic)  I discussed the assessment and treatment plan with the patient and/or parent/guardian. They were provided an opportunity to ask questions and all were answered. They agreed with the plan and demonstrated an understanding of the instructions.   They were advised to call back or seek an in-person evaluation if the symptoms worsen or if the condition fails to improve as anticipated.  Abigail Butts, LCSW  [facility] [phone]

## 2020-12-09 ENCOUNTER — Other Ambulatory Visit: Payer: Self-pay

## 2020-12-09 ENCOUNTER — Ambulatory Visit (INDEPENDENT_AMBULATORY_CARE_PROVIDER_SITE_OTHER): Payer: Medicaid Other | Admitting: Clinical

## 2020-12-09 DIAGNOSIS — F339 Major depressive disorder, recurrent, unspecified: Secondary | ICD-10-CM

## 2020-12-09 DIAGNOSIS — F419 Anxiety disorder, unspecified: Secondary | ICD-10-CM

## 2020-12-09 DIAGNOSIS — F4310 Post-traumatic stress disorder, unspecified: Secondary | ICD-10-CM

## 2020-12-09 NOTE — BH Specialist Note (Addendum)
Integrated Behavioral Health via Telemedicine Visit  12/09/2020 Jyl Chico [id number]  Number of Integrated Behavioral Health visits: 45/50 Session Start time: 2:00  Session End time: 2:50 Total time: 50   Referring Provider: Raliegh Ip, NP Patient/Family location: [address] [place], [place] (daughter's home) [facility] Provider location: [facility] All persons participating in visit: CSW, patient Types of Service: Individual psychotherapy and Telephone visit  I connected with [name] via Telephone and verified that I am speaking with the correct person using two identifiers. Discussed confidentiality: Yes   I discussed the limitations of telemedicine and the availability of in person appointments.  Discussed there is a possibility of technology failure and discussed alternative modes of communication if that failure occurs.  I discussed that engaging in this telemedicine visit, they consent to the provision of behavioral healthcare and the services will be billed under their insurance.  Patient and/or legal guardian expressed understanding and consented to Telemedicine visit: Yes   Presenting Concerns: Patient and/or family reports the following symptoms/concerns: depression and anxiety Duration of problem: approx 30 years; Severity of problem: moderate  Patient and/or Family's Strengths/Protective Factors: Physical Health (exercise, healthy diet, medication compliance, etc.) and empathy, religious beliefs  Goals Addressed: Patient will:  Reduce symptoms of: anxiety and depression   Demonstrate ability to: Increase healthy adjustment to current life circumstances  Progress towards Goals: Ongoing  Interventions: Interventions utilized:  Behavioral Activation and Supportive Counseling Standardized Assessments completed: Not Needed   Patient continues to experience symptoms of depression. She feels this is related to her current living and  financial situation. Patient experiences low levels of motivation to engage in valued activities. Behavioral activation today to explore different ways of engaging in valued activities and exploration of how motivation follows behavior. Patient is interested in learning [place]. Behavioral activation plan made today and patient set goal to download a language learning app and start trying some of the activities. Supportive counseling also provided around family dynamics lately.   Patient and/or Family Response: Patient engaged in session. Patient exhibits increased insight into connection of emotions and behaviors.   Assessment: Patient currently experiencing anxiety, depression, schizophrenia and PTSD. Patient experiencing negative thoughts about self in context of these challenges. Patient also experiencing challenges navigating systems to access resources.   Patient may benefit from continued therapy including CBT to explore unhelpful thoughts about self in context of family dynamics, and mindfulness for coping with elevated emotions.  Plan Follow up with behavioral health clinician on: 12/16/20 Referral(s): Integrated [employer] (In Clinic)  I discussed the assessment and treatment plan with the patient and/or parent/guardian. They were provided an opportunity to ask questions and all were answered. They agreed with the plan and demonstrated an understanding of the instructions.   They were advised to call back or seek an in-person evaluation if the symptoms worsen or if the condition fails to improve as anticipated.  Abigail Butts, LCSW  [facility] [phone]

## 2020-12-16 ENCOUNTER — Other Ambulatory Visit: Payer: Self-pay

## 2020-12-16 ENCOUNTER — Ambulatory Visit (INDEPENDENT_AMBULATORY_CARE_PROVIDER_SITE_OTHER): Payer: Medicaid Other | Admitting: Clinical

## 2020-12-16 DIAGNOSIS — F339 Major depressive disorder, recurrent, unspecified: Secondary | ICD-10-CM

## 2020-12-16 DIAGNOSIS — F4310 Post-traumatic stress disorder, unspecified: Secondary | ICD-10-CM

## 2020-12-16 DIAGNOSIS — F419 Anxiety disorder, unspecified: Secondary | ICD-10-CM
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
## 2020-12-23 ENCOUNTER — Other Ambulatory Visit: Payer: Self-pay

## 2020-12-23 ENCOUNTER — Ambulatory Visit (INDEPENDENT_AMBULATORY_CARE_PROVIDER_SITE_OTHER): Payer: Medicaid Other | Admitting: Clinical

## 2020-12-23 DIAGNOSIS — F339 Major depressive disorder, recurrent, unspecified: Secondary | ICD-10-CM

## 2020-12-23 DIAGNOSIS — F419 Anxiety disorder, unspecified: Secondary | ICD-10-CM

## 2020-12-23 DIAGNOSIS — F4310 Post-traumatic stress disorder, unspecified: Secondary | ICD-10-CM
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
## 2020-12-30 ENCOUNTER — Other Ambulatory Visit: Payer: Self-pay

## 2020-12-30 ENCOUNTER — Ambulatory Visit (INDEPENDENT_AMBULATORY_CARE_PROVIDER_SITE_OTHER): Payer: Medicaid Other | Admitting: Clinical

## 2020-12-30 DIAGNOSIS — F4310 Post-traumatic stress disorder, unspecified: Secondary | ICD-10-CM

## 2020-12-30 DIAGNOSIS — F339 Major depressive disorder, recurrent, unspecified: Secondary | ICD-10-CM

## 2020-12-30 DIAGNOSIS — F419 Anxiety disorder, unspecified: Secondary | ICD-10-CM
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
## 2021-01-06 ENCOUNTER — Other Ambulatory Visit: Payer: Self-pay

## 2021-01-06 ENCOUNTER — Encounter: Payer: Self-pay | Admitting: Clinical

## 2021-01-06 ENCOUNTER — Telehealth (INDEPENDENT_AMBULATORY_CARE_PROVIDER_SITE_OTHER): Payer: Medicaid Other | Admitting: Psychiatry

## 2021-01-06 ENCOUNTER — Encounter (HOSPITAL_COMMUNITY): Payer: Self-pay | Admitting: Psychiatry

## 2021-01-06 DIAGNOSIS — F419 Anxiety disorder, unspecified: Secondary | ICD-10-CM | POA: Diagnosis not present

## 2021-01-06 DIAGNOSIS — F200 Paranoid schizophrenia: Secondary | ICD-10-CM

## 2021-01-06 DIAGNOSIS — F4310 Post-traumatic stress disorder, unspecified: Secondary | ICD-10-CM

## 2021-01-06 MED ORDER — ARIPIPRAZOLE 15 MG PO TABS: 7.5000 mg | ORAL_TABLET | Freq: Every day | ORAL | 2 refills | Status: DC

## 2021-01-06 MED ORDER — DULOXETINE HCL 60 MG PO CPEP: ORAL_CAPSULE | ORAL | 2 refills | Status: DC

## 2021-01-06 MED ORDER — HYDROXYZINE HCL 25 MG PO TABS: 25.0000 mg | ORAL_TABLET | Freq: Two times a day (BID) | ORAL | 2 refills | Status: DC | PRN
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
## 2021-01-07 ENCOUNTER — Encounter: Payer: Self-pay | Admitting: Clinical

## 2021-01-07 ENCOUNTER — Telehealth: Payer: Self-pay | Admitting: Clinical

## 2021-01-07 NOTE — Telephone Encounter (Signed)
Integrated Behavioral Health General Follow Up Note  01/07/2021 Name: Theresa Olson MRN: [id number] DOB: [birth year] Theresa Olson is a 50 y.o. year old female who sees Kallie Locks, FNP (Inactive) for primary care. LCSW follows patient for Integrated Behavioral Health (IBH) counseling.  Interpreter: No.   Interpreter Name & Language: none  Assessment: Patient experiencing anxiety, depression, PTSD, and schizophrenia.  Ongoing Intervention: Today CSW called patient for scheduled Integrated Behavioral Health (IBH) tele-visit. Patient was out running errands with her daughter so CSW and patient just briefly checked in. Rescheduled next IBH follow up for 01/20/21, as CSW will be out of the office next week.  Review of patient status, including review of consultants reports, relevant laboratory and other test results, and collaboration with appropriate care team members and the patient's provider was performed as part of comprehensive patient evaluation and provision of services.    Abigail Butts, LCSW [facility] [phone]

## 2021-01-15 ENCOUNTER — Other Ambulatory Visit: Payer: Self-pay

## 2021-01-15 ENCOUNTER — Encounter: Payer: Self-pay | Admitting: Nurse Practitioner

## 2021-01-15 ENCOUNTER — Ambulatory Visit: Payer: Self-pay | Admitting: Nurse Practitioner

## 2021-01-15 ENCOUNTER — Telehealth (INDEPENDENT_AMBULATORY_CARE_PROVIDER_SITE_OTHER): Payer: Medicaid Other | Admitting: Nurse Practitioner

## 2021-01-15 DIAGNOSIS — F332 Major depressive disorder, recurrent severe without psychotic features: Secondary | ICD-10-CM

## 2021-01-15 DIAGNOSIS — R7303 Prediabetes: Secondary | ICD-10-CM

## 2021-01-15 DIAGNOSIS — I10 Essential (primary) hypertension: Secondary | ICD-10-CM | POA: Diagnosis not present

## 2021-01-15 DIAGNOSIS — E785 Hyperlipidemia, unspecified: Secondary | ICD-10-CM

## 2021-01-15 DIAGNOSIS — L659 Nonscarring hair loss, unspecified: Secondary | ICD-10-CM

## 2021-01-15 NOTE — Patient Instructions (Signed)
Diabetes Mellitus Basics Diabetes mellitus, or diabetes, is a long-term (chronic) disease. It occurs when the body does not properly use sugar (glucose) that is released from food after you eat. Diabetes mellitus may be caused by one or both of these problems: Your pancreas does not make enough of a hormone called insulin. Your body does not react in a normal way to the insulin that it makes. Insulin lets glucose enter cells in your body. This gives you energy. If you have diabetes, glucose cannot get into cells. This causes high blood glucose (hyperglycemia). How to treat and manage diabetes You may need to take insulin or other diabetes medicines daily to keep your glucose in balance. If you are prescribed insulin, you will learn how to give yourself insulin by injection. You may need to adjust the amount of insulin you take based on the foods that you eat. You will need to check your blood glucose levels using a glucose monitor as told by your health care provider. The readings can help determine if you have low or high blood glucose. Generally, you should have these blood glucose levels: Before meals (preprandial): 80-130 mg/dL (4.4-7.2 mmol/L). After meals (postprandial): below 180 mg/dL (10 mmol/L). Hemoglobin A1c (HbA1c) level: less than 7%. Your health care provider will set treatment goals for you. Keep all follow-up visits. This is important. Follow these instructions at home: Diabetes medicines Take your diabetes medicines every day as told by your health care provider. List your diabetes medicines here: Name of medicine: ______________________________ Amount (dose): _______________ Time (a.m./p.m.): _______________ Notes: ___________________________________ Name of medicine: ______________________________ Amount (dose): _______________ Time (a.m./p.m.): _______________ Notes: ___________________________________ Name of medicine: ______________________________ Amount (dose):  _______________ Time (a.m./p.m.): _______________ Notes: ___________________________________ Insulin If you use insulin, list the types of insulin you use here: Insulin type: ______________________________ Amount (dose): _______________ Time (a.m./p.m.): _______________Notes: ___________________________________ Insulin type: ______________________________ Amount (dose): _______________ Time (a.m./p.m.): _______________ Notes: ___________________________________ Insulin type: ______________________________ Amount (dose): _______________ Time (a.m./p.m.): _______________ Notes: ___________________________________ Insulin type: ______________________________ Amount (dose): _______________ Time (a.m./p.m.): _______________ Notes: ___________________________________ Insulin type: ______________________________ Amount (dose): _______________ Time (a.m./p.m.): _______________ Notes: ___________________________________ Managing blood glucose Check your blood glucose levels using a glucose monitor as told by your health care provider. Write down the times that you check your glucose levels here: Time: _______________ Notes: ___________________________________ Time: _______________ Notes: ___________________________________ Time: _______________ Notes: ___________________________________ Time: _______________ Notes: ___________________________________ Time: _______________ Notes: ___________________________________ Time: _______________ Notes: ___________________________________  Low blood glucose Low blood glucose (hypoglycemia) is when glucose is at or below 70 mg/dL (3.9 mmol/L). Symptoms may include: Feeling: Hungry. Sweaty and clammy. Irritable or easily upset. Dizzy. Sleepy. Having: A fast heartbeat. A headache. A change in your vision. Numbness around the mouth, lips, or tongue. Having trouble with: Moving (coordination). Sleeping. Treating low blood glucose To treat low blood  glucose, eat or drink something containing sugar right away. If you can think clearly and swallow safely, follow the 15:15 rule: Take 15 grams of a fast-acting carb (carbohydrate), as told by your health care provider. Some fast-acting carbs are: Glucose tablets: take 3-4 tablets. Hard candy: eat 3-5 pieces. Fruit juice: drink 4 oz (120 mL). Regular (not diet) soda: drink 4-6 oz (120-180 mL). Honey or sugar: eat 1 Tbsp (15 mL). Check your blood glucose levels 15 minutes after you take the carb. If your glucose is still at or below 70 mg/dL (3.9 mmol/L), take 15 grams of a carb again. If your glucose does not go above 70 mg/dL (3.9 mmol/L) after 3 tries,   get help right away. After your glucose goes back to normal, eat a meal or a snack within 1 hour. Treating very low blood glucose If your glucose is at or below 54 mg/dL (3 mmol/L), you have very low blood glucose (severe hypoglycemia). This is an emergency. Do not wait to see if the symptoms will go away. Get medical help right away. Call your local emergency services (911 in the [place] Do not drive yourself to the hospital. Questions to ask your health care provider Should I talk with a diabetes educator? What equipment will I need to care for myself at home? What diabetes medicines do I need? When should I take them? How often do I need to check my blood glucose levels? What number can I call if I have questions? When is my follow-up visit? Where can I find a support group for people with diabetes? Where to find more information American Diabetes Association: www.diabetes.org Association of Diabetes Care and Education Specialists: www.diabeteseducator.org Contact a health care provider if: Your blood glucose is at or above 240 mg/dL (13.3 mmol/L) for 2 days in a row. You have been sick or have had a fever for 2 days or more, and you are not getting better. You have any of these problems for more than 6 hours: You cannot eat or  drink. You feel nauseous. You vomit. You have diarrhea. Get help right away if: Your blood glucose is lower than 54 mg/dL (3 mmol/L). You get confused. You have trouble thinking clearly. You have trouble breathing. These symptoms may represent a serious problem that is an emergency. Do not wait to see if the symptoms will go away. Get medical help right away. Call your local emergency services (911 in the [place] Do not drive yourself to the hospital. Summary Diabetes mellitus is a chronic disease that occurs when the body does not properly use sugar (glucose) that is released from food after you eat. Take insulin and diabetes medicines as told. Check your blood glucose every day, as often as told. Keep all follow-up visits. This is important. This information is not intended to replace advice given to you by your health care provider. Make sure you discuss any questions you have with your health care provider. Document Revised: 09/17/2019 Document Reviewed: 09/17/2019 Elsevier Patient Education  2022 Elsevier Inc.  Diabetes Mellitus and Nutrition, Adult When you have diabetes, or diabetes mellitus, it is very important to have healthy eating habits because your blood sugar (glucose) levels are greatly affected by what you eat and drink. Eating healthy foods in the right amounts, at about the same times every day, can help you: Control your blood glucose. Lower your risk of heart disease. Improve your blood pressure. Reach or maintain a healthy weight. What can affect my meal plan? Every person with diabetes is different, and each person has different needs for a meal plan. Your health care provider may recommend that you work with a dietitian to make a meal plan that is best for you. Your meal plan may vary depending on factors such as: The calories you need. The medicines you take. Your weight. Your blood glucose, blood pressure, and cholesterol levels. Your activity level. Other  health conditions you have, such as heart or kidney disease. How do carbohydrates affect me? Carbohydrates, also called carbs, affect your blood glucose level more than any other type of food. Eating carbs naturally raises the amount of glucose in your blood. Carb counting is a method for keeping track   of how many carbs you eat. Counting carbs is important to keep your blood glucose at a healthy level, especially if you use insulin or take certain oral diabetes medicines. It is important to know how many carbs you can safely have in each meal. This is different for every person. Your dietitian can help you calculate how many carbs you should have at each meal and for each snack. How does alcohol affect me? Alcohol can cause a sudden decrease in blood glucose (hypoglycemia), especially if you use insulin or take certain oral diabetes medicines. Hypoglycemia can be a life-threatening condition. Symptoms of hypoglycemia, such as sleepiness, dizziness, and confusion, are similar to symptoms of having too much alcohol. Do not drink alcohol if: Your health care provider tells you not to drink. You are pregnant, may be pregnant, or are planning to become pregnant. If you drink alcohol: Do not drink on an empty stomach. Limit how much you use to: 0-1 drink a day for women. 0-2 drinks a day for men. Be aware of how much alcohol is in your drink. In the [place] one drink equals one 12 oz bottle of beer (355 mL), one 5 oz glass of wine (148 mL), or one 1 oz glass of hard liquor (44 mL). Keep yourself hydrated with water, diet soda, or unsweetened iced tea. Keep in mind that regular soda, juice, and other mixers may contain a lot of sugar and must be counted as carbs. What are tips for following this plan? Reading food labels Start by checking the serving size on the "Nutrition Facts" label of packaged foods and drinks. The amount of calories, carbs, fats, and other nutrients listed on the label is based on  one serving of the item. Many items contain more than one serving per package. Check the total grams (g) of carbs in one serving. You can calculate the number of servings of carbs in one serving by dividing the total carbs by 15. For example, if a food has 30 g of total carbs per serving, it would be equal to 2 servings of carbs. Check the number of grams (g) of saturated fats and trans fats in one serving. Choose foods that have a low amount or none of these fats. Check the number of milligrams (mg) of salt (sodium) in one serving. Most people should limit total sodium intake to less than 2,300 mg per day. Always check the nutrition information of foods labeled as "low-fat" or "nonfat." These foods may be higher in added sugar or refined carbs and should be avoided. Talk to your dietitian to identify your daily goals for nutrients listed on the label. Shopping Avoid buying canned, pre-made, or processed foods. These foods tend to be high in fat, sodium, and added sugar. Shop around the outside edge of the grocery store. This is where you will most often find fresh fruits and vegetables, bulk grains, fresh meats, and fresh dairy. Cooking Use low-heat cooking methods, such as baking, instead of high-heat cooking methods like deep frying. Cook using healthy oils, such as olive, canola, or sunflower oil. Avoid cooking with butter, cream, or high-fat meats. Meal planning Eat meals and snacks regularly, preferably at the same times every day. Avoid going long periods of time without eating. Eat foods that are high in fiber, such as fresh fruits, vegetables, beans, and whole grains. Talk with your dietitian about how many servings of carbs you can eat at each meal. Eat 4-6 oz (112-168 g) of lean protein each day,   such as lean meat, chicken, fish, eggs, or tofu. One ounce (oz) of lean protein is equal to: 1 oz (28 g) of meat, chicken, or fish. 1 egg.  cup (62 g) of tofu. Eat some foods each day that  contain healthy fats, such as avocado, nuts, seeds, and fish. What foods should I eat? Fruits Berries. Apples. Oranges. Peaches. Apricots. Plums. Grapes. Mango. Papaya. Pomegranate. Kiwi. Cherries. Vegetables Lettuce. Spinach. Leafy greens, including kale, chard, collard greens, and mustard greens. Beets. Cauliflower. Cabbage. Broccoli. Carrots. Green beans. Tomatoes. Peppers. Onions. Cucumbers. Brussels sprouts. Grains Whole grains, such as whole-wheat or whole-grain bread, crackers, tortillas, cereal, and pasta. Unsweetened oatmeal. Quinoa. Brown or wild rice. Meats and other proteins Seafood. Poultry without skin. Lean cuts of poultry and beef. Tofu. Nuts. Seeds. Dairy Low-fat or fat-free dairy products such as milk, yogurt, and cheese. The items listed above may not be a complete list of foods and beverages you can eat. Contact a dietitian for more information. What foods should I avoid? Fruits Fruits canned with syrup. Vegetables Canned vegetables. Frozen vegetables with butter or cream sauce. Grains Refined white flour and flour products such as bread, pasta, snack foods, and cereals. Avoid all processed foods. Meats and other proteins Fatty cuts of meat. Poultry with skin. Breaded or fried meats. Processed meat. Avoid saturated fats. Dairy Full-fat yogurt, cheese, or milk. Beverages Sweetened drinks, such as soda or iced tea. The items listed above may not be a complete list of foods and beverages you should avoid. Contact a dietitian for more information. Questions to ask a health care provider Do I need to meet with a diabetes educator? Do I need to meet with a dietitian? What number can I call if I have questions? When are the best times to check my blood glucose? Where to find more information: American Diabetes Association: diabetes.org Academy of Nutrition and Dietetics: www.eatright.org National Institute of Diabetes and Digestive and Kidney Diseases:  www.niddk.nih.gov Association of Diabetes Care and Education Specialists: www.diabeteseducator.org Summary It is important to have healthy eating habits because your blood sugar (glucose) levels are greatly affected by what you eat and drink. A healthy meal plan will help you control your blood glucose and maintain a healthy lifestyle. Your health care provider may recommend that you work with a dietitian to make a meal plan that is best for you. Keep in mind that carbohydrates (carbs) and alcohol have immediate effects on your blood glucose levels. It is important to count carbs and to use alcohol carefully. This information is not intended to replace advice given to you by your health care provider. Make sure you discuss any questions you have with your health care provider. Document Revised: 04/23/2019 Document Reviewed: 04/23/2019 Elsevier Patient Education  2021 Elsevier Inc.  

## 2021-01-15 NOTE — Progress Notes (Signed)
   [facility] Patient [facility] [address] Pall [place], [place]  [address] Phone:  [phone]   Fax:  [phone] Virtual Visit via Telephone Note  I connected with Theresa Olson on 01/15/21 at  9:00 AM EDT by telephone and verified that I am speaking with the correct person using two identifiers.   I discussed the limitations, risks, security and privacy concerns of performing an evaluation and management service by telephone and the availability of in person appointments. I also discussed with the patient that there may be a patient responsible charge related to this service. The patient expressed understanding and agreed to proceed.  Patient home Provider Office  History of Present Illness:  Theresa Olson  has a past medical history of Anxiety, Anxiety disorder, Carpal tunnel syndrome, Depression, Hot flashes, Hyperlipidemia (10/2019), Hypertension, Insomnia, and Vitamin D deficiency (12/2018).   Hypertension Patient is here for follow-up of elevated blood pressure. She is exercising and is adherent to a low-salt diet. Cardiac symptoms: none. Patient denies chest pain, chest pressure/discomfort, claudication, dyspnea, exertional chest pressure/discomfort, fatigue, irregular heart beat, lower extremity edema, near-syncope, orthopnea, palpitations, paroxysmal nocturnal dyspnea, syncope, and tachypnea. Cardiovascular risk factors: hypertension. Use of agents associated with hypertension: none. History of target organ damage: none.  She does take DHT blocker for hair thinning.  Her TSH 1 year ago was within normal range.  She denies family history hair thinning or alopecia.   She reports that her sister was recently diagnosed with diabetes ROS   Observations/Objective: No exam; telephone visit  Assessment and Plan: 1. Primary hypertension Stable encourage patient to continue with the amlodipine Encouraged home monitoring and recording BP <130/80 Eating a heart-healthy diet with  less salt Encouraged regular physical activity  Recommend Weight loss  2. Hyperlipidemia, unspecified hyperlipidemia type Continue to encourage heart healthy diet  3. Major depressive disorder, recurrent severe without psychotic features (HCC) Stable Continue with current regimen and counseling 4. Prediabetes Hemoglobin A1c on 10/01/2020 indicated 6.5% Diabetes.  Will repeat A1c on 01/20/2021 Weight loss at least 5% of current body weight is can be achieved with lifestyle modification dietary changes and regular daily exercise Encourage blood pressure control goal <120/80 and maintaining total cholesterol <200 Follow-up every 3 months for reevaluation Education material provided  5. Hair Thinning Encouraged hair skin and nail Follow Up Instructions:   3 months    I discussed the assessment and treatment plan with the patient. The patient was provided an opportunity to ask questions and all were answered. The patient agreed with the plan and demonstrated an understanding of the instructions.   The patient was advised to call back or seek an in-person evaluation if the symptoms worsen or if the condition fails to improve as anticipated.  I provided 9 minutes of telephone- visit time during this encounter.   Barbette Merino, NP

## 2021-01-20 ENCOUNTER — Encounter: Payer: Self-pay | Admitting: Clinical

## 2021-01-27 ENCOUNTER — Other Ambulatory Visit (HOSPITAL_COMMUNITY): Payer: Self-pay

## 2021-01-29 ENCOUNTER — Telehealth: Payer: Self-pay | Admitting: Clinical

## 2021-01-29 NOTE — Telephone Encounter (Signed)
CSW had been unable to reach patient after patient missed last Integrated Behavioral Health (IBH) appointment 8/24. Reached patient by phone today and she reported her phone had been off. Scheduled follow up IBH appointment for 02/03/21.   Abigail Butts, LCSW [facility] [phone]

## 2021-02-03 ENCOUNTER — Other Ambulatory Visit: Payer: Self-pay

## 2021-02-03 ENCOUNTER — Ambulatory Visit (INDEPENDENT_AMBULATORY_CARE_PROVIDER_SITE_OTHER): Payer: Medicaid Other | Admitting: Clinical

## 2021-02-03 DIAGNOSIS — F339 Major depressive disorder, recurrent, unspecified: Secondary | ICD-10-CM | POA: Diagnosis not present

## 2021-02-03 DIAGNOSIS — F4310 Post-traumatic stress disorder, unspecified: Secondary | ICD-10-CM

## 2021-02-03 DIAGNOSIS — F419 Anxiety disorder, unspecified: Secondary | ICD-10-CM | POA: Diagnosis not present

## 2021-02-03 NOTE — BH Specialist Note (Addendum)
Integrated Behavioral Health via Telemedicine Visit  02/03/2021 Tonjia Parillo [id number]  Number of Integrated Behavioral Health visits: 49/50 Session Start time: 1:00  Session End time: 1:50 Total time: 50   Referring Provider: Raliegh Ip, NP Patient/Family location: [address] [place], [place] (daughter's home) [facility] Provider location: [facility] All persons participating in visit: CSW, patient Types of Service: Individual psychotherapy and Telephone visit  I connected with [name] via Telephone and verified that I am speaking with the correct person using two identifiers. Discussed confidentiality: Yes   I discussed the limitations of telemedicine and the availability of in person appointments.  Discussed there is a possibility of technology failure and discussed alternative modes of communication if that failure occurs.  I discussed that engaging in this telemedicine visit, they consent to the provision of behavioral healthcare and the services will be billed under their insurance.  Patient and/or legal guardian expressed understanding and consented to Telemedicine visit: Yes   Presenting Concerns: Patient and/or family reports the following symptoms/concerns: depression and anxiety Duration of problem: approx 30 years; Severity of problem: moderate  Patient and/or Family's Strengths/Protective Factors: Physical Health (exercise, healthy diet, medication compliance, etc.) and empathy, religious beliefs  Goals Addressed: Patient will:  Reduce symptoms of: anxiety and depression   Demonstrate ability to: Increase healthy adjustment to current life circumstances  Progress towards Goals: Ongoing  Interventions: Interventions utilized:  Supportive Counseling Standardized Assessments completed: Not Needed   Supportive counseling today regarding patient's coping with living situation and feeling empowered to make life choices. Patient  awaiting disability determination an does not have income currently; she is staying with her daughter.   Patient and/or Family Response: Patient engaged in session.   Assessment: Patient currently experiencing anxiety, depression, schizophrenia and PTSD. Patient experiencing negative thoughts about self in context of these challenges. Patient also experiencing challenges navigating systems to access resources.   Patient may benefit from continued therapy including CBT to explore unhelpful thoughts about self in context of family dynamics, and mindfulness for coping with elevated emotions.  Plan Follow up with behavioral health clinician on: 02/10/21 Referral(s): Integrated [employer] (In Clinic)  I discussed the assessment and treatment plan with the patient and/or parent/guardian. They were provided an opportunity to ask questions and all were answered. They agreed with the plan and demonstrated an understanding of the instructions.   They were advised to call back or seek an in-person evaluation if the symptoms worsen or if the condition fails to improve as anticipated.  Abigail Butts, LCSW  [facility] [phone]

## 2021-02-10 ENCOUNTER — Ambulatory Visit (INDEPENDENT_AMBULATORY_CARE_PROVIDER_SITE_OTHER): Payer: Medicaid Other | Admitting: Clinical

## 2021-02-10 ENCOUNTER — Other Ambulatory Visit: Payer: Self-pay

## 2021-02-10 DIAGNOSIS — F339 Major depressive disorder, recurrent, unspecified: Secondary | ICD-10-CM

## 2021-02-10 DIAGNOSIS — F419 Anxiety disorder, unspecified: Secondary | ICD-10-CM

## 2021-02-10 DIAGNOSIS — F4310 Post-traumatic stress disorder, unspecified: Secondary | ICD-10-CM | POA: Diagnosis not present

## 2021-02-10 NOTE — BH Specialist Note (Addendum)
Integrated Behavioral Health via Telemedicine Visit  02/10/2021 Gretna Bergin [id number]  Number of Integrated Behavioral Health visits: 50/60 Session Start time: 2:05  Session End time: 2:45 Total time: 40   Referring Provider: Raliegh Ip, NP Patient/Family location: [address] [place], [place] (daughter's home) [facility] Provider location: [facility] All persons participating in visit: CSW, patient Types of Service: Individual psychotherapy and Telephone visit  I connected with [name] via Telephone and verified that I am speaking with the correct person using two identifiers. Discussed confidentiality: Yes   I discussed the limitations of telemedicine and the availability of in person appointments.  Discussed there is a possibility of technology failure and discussed alternative modes of communication if that failure occurs.  I discussed that engaging in this telemedicine visit, they consent to the provision of behavioral healthcare and the services will be billed under their insurance.  Patient and/or legal guardian expressed understanding and consented to Telemedicine visit: Yes   Presenting Concerns: Patient and/or family reports the following symptoms/concerns: depression and anxiety Duration of problem: approx 30 years; Severity of problem: moderate  Patient and/or Family's Strengths/Protective Factors: Physical Health (exercise, healthy diet, medication compliance, etc.) and empathy, religious beliefs  Goals Addressed: Patient will:  Reduce symptoms of: anxiety and depression   Demonstrate ability to: Increase healthy adjustment to current life circumstances  Progress towards Goals: Ongoing  Interventions: Interventions utilized:  Supportive Counseling Standardized Assessments completed: Not Needed   Supportive counseling today regarding patient's coping with living situation and feeling empowered to make life choices. Patient  awaiting disability determination an does not have income currently; she is staying with her daughter. Motivational interviewing to assess patient's motivation about certain choices. Planning to use notebook to write down important thoughts, as patient feels her thoughts are scattered. She continues to take medication prescribed by psychiatry and reports it continues to be beneficial. Breathing exercise practiced today.  Patient and/or Family Response: Patient engaged in session.   Assessment: Patient currently experiencing anxiety, depression, schizophrenia and PTSD. Patient experiencing negative thoughts about self in context of these challenges. Patient also experiencing challenges navigating systems to access resources.   Patient may benefit from continued therapy including CBT to explore unhelpful thoughts about self in context of family dynamics, and mindfulness for coping with elevated emotions.  Plan Follow up with behavioral health clinician on: 02/17/21 Referral(s): Integrated [employer] (In Clinic)  I discussed the assessment and treatment plan with the patient and/or parent/guardian. They were provided an opportunity to ask questions and all were answered. They agreed with the plan and demonstrated an understanding of the instructions.   They were advised to call back or seek an in-person evaluation if the symptoms worsen or if the condition fails to improve as anticipated.  Abigail Butts, LCSW  [facility] [phone]

## 2021-02-17 ENCOUNTER — Ambulatory Visit (INDEPENDENT_AMBULATORY_CARE_PROVIDER_SITE_OTHER): Payer: Medicaid Other | Admitting: Clinical

## 2021-02-17 ENCOUNTER — Other Ambulatory Visit: Payer: Self-pay

## 2021-02-17 DIAGNOSIS — F419 Anxiety disorder, unspecified: Secondary | ICD-10-CM

## 2021-02-17 DIAGNOSIS — F339 Major depressive disorder, recurrent, unspecified: Secondary | ICD-10-CM

## 2021-02-17 DIAGNOSIS — F4310 Post-traumatic stress disorder, unspecified: Secondary | ICD-10-CM

## 2021-02-17 NOTE — BH Specialist Note (Addendum)
Integrated Behavioral Health via Telemedicine Visit  02/17/2021 Theresa Olson [id number]  Number of Integrated Behavioral Health visits: 51/60 Session Start time: 2:00  Session End time: 2:45 Total time: 45   Referring Provider: Raliegh Ip, NP Patient/Family location: [address] [place], [place] (daughter's home) [facility] Provider location: [facility] All persons participating in visit: CSW, patient Types of Service: Individual psychotherapy and Telephone visit  I connected with Theresa Olson via Telephone and verified that I am speaking with the correct person using two identifiers. Discussed confidentiality: Yes   I discussed the limitations of telemedicine and the availability of in person appointments.  Discussed there is a possibility of technology failure and discussed alternative modes of communication if that failure occurs.  I discussed that engaging in this telemedicine visit, they consent to the provision of behavioral healthcare and the services will be billed under their insurance.  Patient and/or legal guardian expressed understanding and consented to Telemedicine visit: Yes   Presenting Concerns: Patient and/or family reports the following symptoms/concerns: depression and anxiety Duration of problem: approx 30 years; Severity of problem: moderate  Patient and/or Family's Strengths/Protective Factors: Physical Health (exercise, healthy diet, medication compliance, etc.) and empathy, religious beliefs  Goals Addressed: Patient will:  Reduce symptoms of: anxiety and depression   Demonstrate ability to: Increase healthy adjustment to current life circumstances  Progress towards Goals: Ongoing  Interventions: Interventions utilized:  Mindfulness or Management consultant and CBT Cognitive Behavioral Therapy Standardized Assessments completed: Not Needed   CBT today to explore and challenge patient's negative thoughts about self in context  of current living and financial situation. Mindfulness exercise for cultivating self-compassion.  Patient and/or Family Response: Patient engaged in session.   Assessment: Patient currently experiencing anxiety, depression, schizophrenia and PTSD. Patient experiencing negative thoughts about self in context of these challenges. Patient also experiencing challenges navigating systems to access resources.   Patient may benefit from continued therapy including CBT to explore unhelpful thoughts about self in context of family dynamics, and mindfulness for coping with elevated emotions.  Plan Follow up with behavioral health clinician on: 02/24/21 Referral(s): Integrated [employer] (In Clinic)  I discussed the assessment and treatment plan with the patient and/or parent/guardian. They were provided an opportunity to ask questions and all were answered. They agreed with the plan and demonstrated an understanding of the instructions.   They were advised to call back or seek an in-person evaluation if the symptoms worsen or if the condition fails to improve as anticipated.  Abigail Butts, LCSW  [facility] [phone]

## 2021-02-24 ENCOUNTER — Encounter: Payer: Self-pay | Admitting: Clinical

## 2021-03-03 ENCOUNTER — Ambulatory Visit (INDEPENDENT_AMBULATORY_CARE_PROVIDER_SITE_OTHER): Payer: Medicaid Other | Admitting: Clinical

## 2021-03-03 ENCOUNTER — Other Ambulatory Visit: Payer: Self-pay

## 2021-03-03 DIAGNOSIS — F4310 Post-traumatic stress disorder, unspecified: Secondary | ICD-10-CM | POA: Diagnosis not present

## 2021-03-03 DIAGNOSIS — F339 Major depressive disorder, recurrent, unspecified: Secondary | ICD-10-CM

## 2021-03-03 DIAGNOSIS — F419 Anxiety disorder, unspecified: Secondary | ICD-10-CM

## 2021-03-03 NOTE — BH Specialist Note (Addendum)
Integrated Behavioral Health via Telemedicine Visit  03/03/2021 Theresa Olson [id number]  Number of Integrated Behavioral Health visits: 52/60 Session Start time: 1:00  Session End time: 1:45 Total time: 45   Referring Provider: Raliegh Ip, NP Patient/Family location: [address] [place], [place] (daughter's home) [facility] Provider location: [facility] All persons participating in visit: CSW, patient Types of Service: Individual psychotherapy and Telephone visit  I connected with [name] via Telephone and verified that I am speaking with the correct person using two identifiers. Discussed confidentiality: Yes   I discussed the limitations of telemedicine and the availability of in person appointments.  Discussed there is a possibility of technology failure and discussed alternative modes of communication if that failure occurs.  I discussed that engaging in this telemedicine visit, they consent to the provision of behavioral healthcare and the services will be billed under their insurance.  Patient and/or legal guardian expressed understanding and consented to Telemedicine visit: Yes   Presenting Concerns: Patient and/or family reports the following symptoms/concerns: depression and anxiety Duration of problem: approx 30 years; Severity of problem: moderate  Patient and/or Family's Strengths/Protective Factors: Physical Health (exercise, healthy diet, medication compliance, etc.) and empathy, religious beliefs  Goals Addressed: Patient will:  Reduce symptoms of: anxiety and depression   Demonstrate ability to: Increase healthy adjustment to current life circumstances  Progress towards Goals: Ongoing  Interventions: Interventions utilized:  CBT Cognitive Behavioral Therapy and Supportive Counseling Standardized Assessments completed: Not Needed   Supportive counseling today to explore patient's thoughts about self and relationship with son in  context of his legal challenges. Discussed healthy boundary setting and impact on relationships.   Patient and/or Family Response: Patient engaged in session.   Assessment: Patient currently experiencing anxiety, depression, schizophrenia and PTSD. Patient experiencing negative thoughts about self in context of these challenges. Patient also experiencing challenges navigating systems to access resources.   Patient may benefit from continued therapy including CBT to explore unhelpful thoughts about self in context of family dynamics, and mindfulness for coping with elevated emotions.  Plan Follow up with behavioral health clinician on: 03/10/21 Referral(s): Integrated [employer] (In Clinic)  I discussed the assessment and treatment plan with the patient and/or parent/guardian. They were provided an opportunity to ask questions and all were answered. They agreed with the plan and demonstrated an understanding of the instructions.   They were advised to call back or seek an in-person evaluation if the symptoms worsen or if the condition fails to improve as anticipated.  Abigail Butts, LCSW  [facility] [phone]

## 2021-03-10 ENCOUNTER — Other Ambulatory Visit: Payer: Self-pay

## 2021-03-10 ENCOUNTER — Ambulatory Visit (INDEPENDENT_AMBULATORY_CARE_PROVIDER_SITE_OTHER): Payer: Medicaid Other | Admitting: Clinical

## 2021-03-10 DIAGNOSIS — F419 Anxiety disorder, unspecified: Secondary | ICD-10-CM

## 2021-03-10 DIAGNOSIS — F339 Major depressive disorder, recurrent, unspecified: Secondary | ICD-10-CM

## 2021-03-10 DIAGNOSIS — F4310 Post-traumatic stress disorder, unspecified: Secondary | ICD-10-CM

## 2021-03-10 NOTE — BH Specialist Note (Addendum)
Integrated Behavioral Health via Telemedicine Visit  03/10/2021 Theresa Olson [id number]  Number of Integrated Behavioral Health visits: 53/60 Session Start time: 3:10  Session End time: 3:35 Total time: 25  Referring Provider: Raliegh Ip, NP Patient/Family location: [address] [place], [place] (daughter's home) [facility] Provider location: [facility] All persons participating in visit: CSW, patient Types of Service: Individual psychotherapy and Telephone visit  I connected with Theresa Olson via Telephone and verified that I am speaking with the correct person using two identifiers. Discussed confidentiality: Yes   I discussed the limitations of telemedicine and the availability of in person appointments.  Discussed there is a possibility of technology failure and discussed alternative modes of communication if that failure occurs.  I discussed that engaging in this telemedicine visit, they consent to the provision of behavioral healthcare and the services will be billed under their insurance.  Patient and/or legal guardian expressed understanding and consented to Telemedicine visit: Yes   Presenting Concerns: Patient and/or family reports the following symptoms/concerns: depression and anxiety Duration of problem: approx 30 years; Severity of problem: moderate  Patient and/or Family's Strengths/Protective Factors: Physical Health (exercise, healthy diet, medication compliance, etc.) and empathy, religious beliefs  Goals Addressed: Patient will:  Reduce symptoms of: anxiety and depression   Demonstrate ability to: Increase healthy adjustment to current life circumstances  Progress towards Goals: Ongoing  Interventions: Interventions utilized:  Supportive Counseling Standardized Assessments completed: Not Needed   Supportive counseling today to explore patient's thoughts about self in context of relationships with family members. Patient  continues to use coping skills learned in previous sessions for elevated emotions and anxiety. Patient reports her medication from psychiatry continues to be helpful. Planned with patient to decrease meeting frequency to biweekly.   Patient and/or Family Response: Patient engaged in session.   Assessment: Patient currently experiencing anxiety, depression, schizophrenia and PTSD. Patient experiencing negative thoughts about self in context of these challenges. Patient also experiencing challenges navigating systems to access resources.   Patient may benefit from continued therapy including CBT to explore unhelpful thoughts about self in context of family dynamics, and mindfulness for coping with elevated emotions.  Plan Follow up with behavioral health clinician on: 03/24/21 Referral(s): Integrated [employer] (In Clinic)  I discussed the assessment and treatment plan with the patient and/or parent/guardian. They were provided an opportunity to ask questions and all were answered. They agreed with the plan and demonstrated an understanding of the instructions.   They were advised to call back or seek an in-person evaluation if the symptoms worsen or if the condition fails to improve as anticipated.  Abigail Butts, LCSW  [facility] [phone]

## 2021-03-24 ENCOUNTER — Ambulatory Visit (INDEPENDENT_AMBULATORY_CARE_PROVIDER_SITE_OTHER): Payer: Medicaid Other | Admitting: Clinical

## 2021-03-24 ENCOUNTER — Other Ambulatory Visit: Payer: Self-pay

## 2021-03-24 DIAGNOSIS — F4310 Post-traumatic stress disorder, unspecified: Secondary | ICD-10-CM | POA: Diagnosis not present

## 2021-03-24 DIAGNOSIS — F339 Major depressive disorder, recurrent, unspecified: Secondary | ICD-10-CM

## 2021-03-24 DIAGNOSIS — F419 Anxiety disorder, unspecified: Secondary | ICD-10-CM

## 2021-03-24 NOTE — BH Specialist Note (Addendum)
Integrated Behavioral Health via Telemedicine Visit  03/24/2021 Torah Pinnock [id number]  Number of Integrated Behavioral Health visits: 54/60 Session Start time: 2:00  Session End time: 2:55 Total time: 78  Referring Provider: Raliegh Ip, NP Patient/Family location: [name], [place] (daughter's home) [facility] Provider location: [facility] All persons participating in visit: CSW, patient Types of Service: Individual psychotherapy and Telephone visit  I connected with [name] via Telephone and verified that I am speaking with the correct person using two identifiers. Discussed confidentiality: Yes   I discussed the limitations of telemedicine and the availability of in person appointments.  Discussed there is a possibility of technology failure and discussed alternative modes of communication if that failure occurs.  I discussed that engaging in this telemedicine visit, they consent to the provision of behavioral healthcare and the services will be billed under their insurance.  Patient and/or legal guardian expressed understanding and consented to Telemedicine visit: Yes   Presenting Concerns: Patient and/or family reports the following symptoms/concerns: depression and anxiety Duration of problem: approx 30 years; Severity of problem: moderate  Patient and/or Family's Strengths/Protective Factors: Physical Health (exercise, healthy diet, medication compliance, etc.) and empathy, religious beliefs  Goals Addressed: Patient will:  Reduce symptoms of: anxiety and depression   Demonstrate ability to: Increase healthy adjustment to current life circumstances  Progress towards Goals: Ongoing  Interventions: Interventions utilized:  CBT Cognitive Behavioral Therapy and Supportive Counseling Standardized Assessments completed: Not Needed   Patient has thoughts of cutting herself, but has not ever acted on it. She reports this as a  newer thought, and characterizes it as curiosity about if it would help alleviate emotional pain. She denies thinking of suicide. She reports when she has these thoughts, she has decided to use grounding breathing exercises instead. CSW reinforced these behaviors and provided positive feedback.   CBT today to explore patient's thoughts and feelings about managing intense emotional episodes. Challenged patient's thought that she is unable to manage intense emotions by reflecting on impermanence of negative emotions and patient's decisions and proven ability to use healthy coping skills at times of emotional intensity.  Reminded patient of follow up appointment with psychiatrist on 04/06/21. Follow up [facility] appointment scheduled for same day.  Patient and/or Family Response: Patient engaged in session.   Assessment: Patient currently experiencing anxiety, depression, schizophrenia and PTSD. Patient experiencing negative thoughts about self in context of these challenges. Patient also experiencing challenges navigating systems to access resources.   Patient may benefit from continued therapy including CBT to explore unhelpful thoughts about self in context of family dynamics, and mindfulness for coping with elevated emotions.  Plan Follow up with behavioral health clinician on: 04/06/21 Referral(s): Integrated [employer] (In Clinic)  I discussed the assessment and treatment plan with the patient and/or parent/guardian. They were provided an opportunity to ask questions and all were answered. They agreed with the plan and demonstrated an understanding of the instructions.   They were advised to call back or seek an in-person evaluation if the symptoms worsen or if the condition fails to improve as anticipated.  Abigail Butts, LCSW  [facility] [phone]

## 2021-04-06 ENCOUNTER — Telehealth (HOSPITAL_BASED_OUTPATIENT_CLINIC_OR_DEPARTMENT_OTHER): Payer: Medicaid Other | Admitting: Psychiatry

## 2021-04-06 ENCOUNTER — Ambulatory Visit (INDEPENDENT_AMBULATORY_CARE_PROVIDER_SITE_OTHER): Payer: Medicaid Other | Admitting: Clinical

## 2021-04-06 ENCOUNTER — Encounter (HOSPITAL_COMMUNITY): Payer: Self-pay | Admitting: Psychiatry

## 2021-04-06 ENCOUNTER — Other Ambulatory Visit: Payer: Self-pay

## 2021-04-06 DIAGNOSIS — F4310 Post-traumatic stress disorder, unspecified: Secondary | ICD-10-CM

## 2021-04-06 DIAGNOSIS — F200 Paranoid schizophrenia: Secondary | ICD-10-CM

## 2021-04-06 DIAGNOSIS — F419 Anxiety disorder, unspecified: Secondary | ICD-10-CM | POA: Diagnosis not present

## 2021-04-06 DIAGNOSIS — F339 Major depressive disorder, recurrent, unspecified: Secondary | ICD-10-CM

## 2021-04-06 MED ORDER — ARIPIPRAZOLE 10 MG PO TABS: 10.0000 mg | ORAL_TABLET | Freq: Every day | ORAL | 1 refills | Status: DC

## 2021-04-06 MED ORDER — HYDROXYZINE HCL 25 MG PO TABS: 25.0000 mg | ORAL_TABLET | Freq: Three times a day (TID) | ORAL | 1 refills | Status: DC | PRN

## 2021-04-06 MED ORDER — DULOXETINE HCL 60 MG PO CPEP
ORAL_CAPSULE | ORAL | 1 refills | Status: DC
Start: 2021-04-06 — End: 2021-06-09
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
## 2021-04-06 NOTE — BH Specialist Note (Addendum)
Integrated Behavioral Health via Telemedicine Visit  04/06/2021 Theresa Olson [id number]  Number of Integrated Behavioral Health visits: 55/60 Session Start time: 1:30  Session End time: 2:20 Total time: 50  Referring Provider: Raliegh Ip, NP Patient/Family location: [address] [place], [place] (daughter's home) [facility] Provider location: [facility] All persons participating in visit: CSW, patient Types of Service: Individual psychotherapy and Telephone visit  I connected with [name] via Telephone and verified that I am speaking with the correct person using two identifiers. Discussed confidentiality: Yes   I discussed the limitations of telemedicine and the availability of in person appointments.  Discussed there is a possibility of technology failure and discussed alternative modes of communication if that failure occurs.  I discussed that engaging in this telemedicine visit, they consent to the provision of behavioral healthcare and the services will be billed under their insurance.  Patient and/or legal guardian expressed understanding and consented to Telemedicine visit: Yes   Presenting Concerns: Patient and/or family reports the following symptoms/concerns: depression and anxiety Duration of problem: approx 30 years; Severity of problem: moderate  Patient and/or Family's Strengths/Protective Factors: Physical Health (exercise, healthy diet, medication compliance, etc.) and empathy, religious beliefs  Goals Addressed: Patient will:  Reduce symptoms of: anxiety and depression   Demonstrate ability to: Increase healthy adjustment to current life circumstances  Progress towards Goals: Ongoing  Interventions: Interventions utilized:  CBT Cognitive Behavioral Therapy and Supportive Counseling Standardized Assessments completed: Not Needed   CBT today to explore thoughts about self in context of negative messages from sister. Emotional  validation and reflective listening provided. Reviewed questions patient has for her psychiatrist for virtual appointment with him this afternoon. Safety assessment and planning. Patient has thoughts of self-harm, denies plan or intent. She does not like the thoughts and uses her coping skills such as thinking about something more positive or doing a breathing exercise when thoughts occur. She agrees to call [phone] if the thoughts increase or feel overwhelming. She continues to take medications as prescribed by psychiatry.  Patient and/or Family Response: Patient engaged in session.   Assessment: Patient currently experiencing anxiety, depression, schizophrenia and PTSD. Patient experiencing negative thoughts about self in context of these challenges. Patient also experiencing challenges navigating systems to access resources.   Patient may benefit from continued therapy including CBT to explore unhelpful thoughts about self in context of family dynamics, and mindfulness for coping with elevated emotions.  Plan Follow up with behavioral health clinician on: 04/21/21 Referral(s): Integrated [employer] (In Clinic)  I discussed the assessment and treatment plan with the patient and/or parent/guardian. They were provided an opportunity to ask questions and all were answered. They agreed with the plan and demonstrated an understanding of the instructions.   They were advised to call back or seek an in-person evaluation if the symptoms worsen or if the condition fails to improve as anticipated.  Abigail Butts, LCSW  [facility] [phone]

## 2021-04-21 ENCOUNTER — Telehealth: Payer: Self-pay | Admitting: Clinical

## 2021-04-21 ENCOUNTER — Encounter: Payer: Self-pay | Admitting: Clinical

## 2021-04-21 NOTE — Telephone Encounter (Signed)
Integrated Behavioral Health General Follow Up Note  04/21/2021 Name: Layan Zalenski MRN: [id number] DOB: 07-18-1970 Zakara Parkey is a 50 y.o. year old female who sees Kallie Locks, FNP (Inactive) for primary care. LCSW follows patient for Integrated Behavioral Health (IBH) counseling.  Interpreter: No.   Interpreter Name & Language: none  Assessment: Patient experiencing anxiety, depression, PTSD, and schizophrenia.  Ongoing Intervention: Today CSW called patient for scheduled phone visit, but patient unavailble for full session. Brief check in and rescheduled for 04/28/21.  Review of patient status, including review of consultants reports, relevant laboratory and other test results, and collaboration with appropriate care team members and the patient's provider was performed as part of comprehensive patient evaluation and provision of services.    Abigail Butts, LCSW [facility] [phone]

## 2021-04-28 ENCOUNTER — Ambulatory Visit (INDEPENDENT_AMBULATORY_CARE_PROVIDER_SITE_OTHER): Payer: Medicaid Other | Admitting: Clinical

## 2021-04-28 ENCOUNTER — Other Ambulatory Visit: Payer: Self-pay

## 2021-04-28 DIAGNOSIS — F332 Major depressive disorder, recurrent severe without psychotic features: Secondary | ICD-10-CM

## 2021-04-28 DIAGNOSIS — F419 Anxiety disorder, unspecified: Secondary | ICD-10-CM | POA: Diagnosis not present

## 2021-04-28 DIAGNOSIS — F200 Paranoid schizophrenia: Secondary | ICD-10-CM

## 2021-04-28 DIAGNOSIS — F4310 Post-traumatic stress disorder, unspecified: Secondary | ICD-10-CM | POA: Diagnosis not present

## 2021-04-28 NOTE — BH Specialist Note (Addendum)
Integrated Behavioral Health via Telemedicine Visit  04/28/2021 Theresa Olson [id number]  Number of Integrated Behavioral Health visits: 56/60 Session Start time: 2:30  Session End time: 3:00 Total time: 30  Referring Provider: Raliegh Ip, NP Patient/Family location: [address] [place], [place] (daughter's home) [facility] Provider location: [facility] All persons participating in visit: CSW, patient Types of Service: Individual psychotherapy and Telephone visit  I connected with Theresa Olson via Telephone and verified that I am speaking with the correct person using two identifiers. Discussed confidentiality: Yes   I discussed the limitations of telemedicine and the availability of in person appointments.  Discussed there is a possibility of technology failure and discussed alternative modes of communication if that failure occurs.  I discussed that engaging in this telemedicine visit, they consent to the provision of behavioral healthcare and the services will be billed under their insurance.  Patient and/or legal guardian expressed understanding and consented to Telemedicine visit: Yes   Presenting Concerns: Patient and/or family reports the following symptoms/concerns: depression and anxiety Duration of problem: approx 30 years; Severity of problem: moderate  Patient and/or Family's Strengths/Protective Factors: Physical Health (exercise, healthy diet, medication compliance, etc.) and empathy, religious beliefs  Goals Addressed: Patient will:  Reduce symptoms of: anxiety and depression   Demonstrate ability to: Increase healthy adjustment to current life circumstances  Progress towards Goals: Ongoing  Interventions: Interventions utilized:  Supportive Counseling Standardized Assessments completed: Not Needed   Supportive counseling today regarding patient's relationships with some family members. Also assisted patient in calling her Legal Aid  attorney that is working with her on disability claim. LVM for attorney to call patient back about a question she had.   Patient and/or Family Response: Patient engaged in session.   Assessment: Patient currently experiencing anxiety, depression, schizophrenia and PTSD. Patient experiencing negative thoughts about self in context of these challenges. Patient also experiencing challenges navigating systems to access resources.   Patient may benefit from continued therapy including CBT to explore unhelpful thoughts about self in context of family dynamics, and mindfulness for coping with elevated emotions.  Plan Follow up with behavioral health clinician on: 05/12/21 Referral(s): Integrated [employer] (In Clinic)  I discussed the assessment and treatment plan with the patient and/or parent/guardian. They were provided an opportunity to ask questions and all were answered. They agreed with the plan and demonstrated an understanding of the instructions.   They were advised to call back or seek an in-person evaluation if the symptoms worsen or if the condition fails to improve as anticipated.  Abigail Butts, LCSW  [facility] [phone]

## 2021-05-12 ENCOUNTER — Ambulatory Visit (INDEPENDENT_AMBULATORY_CARE_PROVIDER_SITE_OTHER): Payer: Medicaid Other | Admitting: Clinical

## 2021-05-12 ENCOUNTER — Other Ambulatory Visit: Payer: Self-pay

## 2021-05-12 DIAGNOSIS — F419 Anxiety disorder, unspecified: Secondary | ICD-10-CM

## 2021-05-12 DIAGNOSIS — F332 Major depressive disorder, recurrent severe without psychotic features: Secondary | ICD-10-CM

## 2021-05-12 DIAGNOSIS — F4310 Post-traumatic stress disorder, unspecified: Secondary | ICD-10-CM | POA: Diagnosis not present

## 2021-05-12 DIAGNOSIS — F200 Paranoid schizophrenia: Secondary | ICD-10-CM

## 2021-05-12 NOTE — BH Specialist Note (Addendum)
Integrated Behavioral Health via Telemedicine Visit  05/12/2021 Theresa Olson [id number]  Number of Integrated Behavioral Health visits: 57/60 Session Start time: 3:00  Session End time: 3:55 Total time: 60  Referring Provider: Raliegh Ip, NP Patient/Family location: [address] [place], [place] (daughter's home) [facility] Provider location: [facility] Center All persons participating in visit: CSW, patient Types of Service: Individual psychotherapy and Telephone visit  I connected with Theresa Olson via Telephone and verified that I am speaking with the correct person using two identifiers. Discussed confidentiality: Yes   I discussed the limitations of telemedicine and the availability of in person appointments.  Discussed there is a possibility of technology failure and discussed alternative modes of communication if that failure occurs.  I discussed that engaging in this telemedicine visit, they consent to the provision of behavioral healthcare and the services will be billed under their insurance.  Patient and/or legal guardian expressed understanding and consented to Telemedicine visit: Yes   Presenting Concerns: Patient and/or family reports the following symptoms/concerns: depression and anxiety Duration of problem: approx 30 years; Severity of problem: moderate  Patient and/or Family's Strengths/Protective Factors: Physical Health (exercise, healthy diet, medication compliance, etc.) and empathy, religious beliefs  Goals Addressed: Patient will:  Reduce symptoms of: anxiety and depression   Demonstrate ability to: Increase healthy adjustment to current life circumstances  Progress towards Goals: Ongoing  Interventions: Interventions utilized:  Supportive Counseling Standardized Assessments completed: Not Needed   CBT today to address some unhelpful thoughts about self in context of negative messages from family member. Supportive counseling to  process emotions around this. Mindfulness activity for coping with negative thoughts and allowing them to pass.  Patient and/or Family Response: Patient engaged in session.   Assessment: Patient currently experiencing anxiety, depression, schizophrenia and PTSD. Patient experiencing negative thoughts about self in context of these challenges. Patient also experiencing challenges navigating systems to access resources.   Patient may benefit from continued therapy including CBT to explore unhelpful thoughts about self in context of family dynamics, and mindfulness for coping with elevated emotions.  Plan Follow up with behavioral health clinician on: 05/26/21 Referral(s): Integrated [employer] (In Clinic)  I discussed the assessment and treatment plan with the patient and/or parent/guardian. They were provided an opportunity to ask questions and all were answered. They agreed with the plan and demonstrated an understanding of the instructions.   They were advised to call back or seek an in-person evaluation if the symptoms worsen or if the condition fails to improve as anticipated.  Abigail Butts, LCSW  [facility] [phone]

## 2021-05-26 ENCOUNTER — Ambulatory Visit (INDEPENDENT_AMBULATORY_CARE_PROVIDER_SITE_OTHER): Payer: Medicaid Other | Admitting: Clinical

## 2021-05-26 ENCOUNTER — Other Ambulatory Visit: Payer: Self-pay

## 2021-05-26 DIAGNOSIS — F332 Major depressive disorder, recurrent severe without psychotic features: Secondary | ICD-10-CM

## 2021-05-26 DIAGNOSIS — F4310 Post-traumatic stress disorder, unspecified: Secondary | ICD-10-CM

## 2021-05-26 DIAGNOSIS — F200 Paranoid schizophrenia: Secondary | ICD-10-CM

## 2021-05-26 DIAGNOSIS — F419 Anxiety disorder, unspecified: Secondary | ICD-10-CM

## 2021-05-26 NOTE — BH Specialist Note (Addendum)
Integrated Behavioral Health via Telemedicine Visit  05/26/2021 Theresa Olson [id number]  Number of Integrated Behavioral Health visits: 58/60 Session Start time: 1:00  Session End time: 1:55 Total time: 38  Referring Provider: Raliegh Ip, NP Patient/Family location: [address] [place], [place] (daughter's home) [facility] Provider location: [facility] Center All persons participating in visit: CSW, patient Types of Service: Individual psychotherapy and Telephone visit  I connected with Theresa Olson via Telephone and verified that I am speaking with the correct person using two identifiers. Discussed confidentiality: Yes   I discussed the limitations of telemedicine and the availability of in person appointments.  Discussed there is a possibility of technology failure and discussed alternative modes of communication if that failure occurs.  I discussed that engaging in this telemedicine visit, they consent to the provision of behavioral healthcare and the services will be billed under their insurance.  Patient and/or legal guardian expressed understanding and consented to Telemedicine visit: Yes   Presenting Concerns: Patient and/or family reports the following symptoms/concerns: depression and anxiety Duration of problem: approx 30 years; Severity of problem: moderate  Patient and/or Family's Strengths/Protective Factors: Physical Health (exercise, healthy diet, medication compliance, etc.) and empathy, religious beliefs  Goals Addressed: Patient will:  Reduce symptoms of: anxiety and depression   Demonstrate ability to: Increase healthy adjustment to current life circumstances  Progress towards Goals: Ongoing  Interventions: Interventions utilized:  CBT Cognitive Behavioral Therapy and Supportive Counseling Standardized Assessments completed: Not Needed   Supportive counseling today to process family dynamics. CBT to explore and challenge unhelpful  thoughts about self in the context of family dynamics. Patient continues to await social security disability determination.   Patient and/or Family Response: Patient engaged in session.   Assessment: Patient currently experiencing anxiety, depression, schizophrenia and PTSD. Patient experiencing negative thoughts about self in context of these challenges. Patient also experiencing challenges navigating systems to access resources.   Patient may benefit from continued therapy including CBT to explore unhelpful thoughts about self in context of family dynamics, and mindfulness for coping with elevated emotions.  Plan Follow up with behavioral health clinician on: 06/09/21 Referral(s): Integrated [employer] (In Clinic)  I discussed the assessment and treatment plan with the patient and/or parent/guardian. They were provided an opportunity to ask questions and all were answered. They agreed with the plan and demonstrated an understanding of the instructions.   They were advised to call back or seek an in-person evaluation if the symptoms worsen or if the condition fails to improve as anticipated.  Abigail Butts, LCSW  [facility] [phone]

## 2021-06-09 ENCOUNTER — Encounter: Payer: Self-pay | Admitting: Clinical

## 2021-06-09 ENCOUNTER — Other Ambulatory Visit: Payer: Self-pay

## 2021-06-09 ENCOUNTER — Telehealth (HOSPITAL_BASED_OUTPATIENT_CLINIC_OR_DEPARTMENT_OTHER): Payer: Medicaid Other | Admitting: Psychiatry

## 2021-06-09 ENCOUNTER — Encounter (HOSPITAL_COMMUNITY): Payer: Self-pay | Admitting: Psychiatry

## 2021-06-09 DIAGNOSIS — F200 Paranoid schizophrenia: Secondary | ICD-10-CM | POA: Diagnosis not present

## 2021-06-09 DIAGNOSIS — F419 Anxiety disorder, unspecified: Secondary | ICD-10-CM

## 2021-06-09 DIAGNOSIS — F4310 Post-traumatic stress disorder, unspecified: Secondary | ICD-10-CM

## 2021-06-09 MED ORDER — DULOXETINE HCL 60 MG PO CPEP: ORAL_CAPSULE | ORAL | 2 refills | Status: DC

## 2021-06-09 MED ORDER — HYDROXYZINE HCL 25 MG PO TABS: 25.0000 mg | ORAL_TABLET | Freq: Three times a day (TID) | ORAL | 2 refills | Status: DC | PRN

## 2021-06-09 MED ORDER — ARIPIPRAZOLE 10 MG PO TABS: 10.0000 mg | ORAL_TABLET | Freq: Every day | ORAL | 2 refills | Status: DC
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
## 2021-06-10 ENCOUNTER — Ambulatory Visit (INDEPENDENT_AMBULATORY_CARE_PROVIDER_SITE_OTHER): Payer: Medicaid Other | Admitting: Clinical

## 2021-06-10 ENCOUNTER — Other Ambulatory Visit: Payer: Self-pay

## 2021-06-10 DIAGNOSIS — F4310 Post-traumatic stress disorder, unspecified: Secondary | ICD-10-CM | POA: Diagnosis not present

## 2021-06-10 DIAGNOSIS — F419 Anxiety disorder, unspecified: Secondary | ICD-10-CM

## 2021-06-10 DIAGNOSIS — F332 Major depressive disorder, recurrent severe without psychotic features: Secondary | ICD-10-CM

## 2021-06-10 DIAGNOSIS — F200 Paranoid schizophrenia: Secondary | ICD-10-CM
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
## 2021-06-23 ENCOUNTER — Other Ambulatory Visit: Payer: Self-pay

## 2021-06-23 ENCOUNTER — Ambulatory Visit (INDEPENDENT_AMBULATORY_CARE_PROVIDER_SITE_OTHER): Payer: Medicaid Other | Admitting: Clinical

## 2021-06-23 DIAGNOSIS — F4310 Post-traumatic stress disorder, unspecified: Secondary | ICD-10-CM | POA: Diagnosis not present

## 2021-06-23 DIAGNOSIS — F419 Anxiety disorder, unspecified: Secondary | ICD-10-CM

## 2021-06-23 DIAGNOSIS — F200 Paranoid schizophrenia: Secondary | ICD-10-CM

## 2021-06-23 DIAGNOSIS — F332 Major depressive disorder, recurrent severe without psychotic features: Secondary | ICD-10-CM

## 2021-06-23 NOTE — BH Specialist Note (Addendum)
Integrated Behavioral Health via Telemedicine Visit  06/23/2021 Theresa Olson [id number]  Number of Integrated Behavioral Health visits: 60/80 Session Start time: 2:05  Session End time: 2:50 Total time: 45  Referring Provider: Raliegh Ip, NP Patient/Family location: [address] [place], [place] (daughter's home) [facility] Provider location: [facility] All persons participating in visit: CSW, patient Types of Service: Individual psychotherapy and Telephone visit  I connected with Theresa Olson via Telephone and verified that I am speaking with the correct person using two identifiers. Discussed confidentiality: Yes   I discussed the limitations of telemedicine and the availability of in person appointments.  Discussed there is a possibility of technology failure and discussed alternative modes of communication if that failure occurs.  I discussed that engaging in this telemedicine visit, they consent to the provision of behavioral healthcare and the services will be billed under their insurance.  Patient and/or legal guardian expressed understanding and consented to Telemedicine visit: Yes   Presenting Concerns: Patient and/or family reports the following symptoms/concerns: depression and anxiety Duration of problem: approx 30 years; Severity of problem: moderate  Patient and/or Family's Strengths/Protective Factors: Physical Health (exercise, healthy diet, medication compliance, etc.) and empathy, religious beliefs  Goals Addressed: Patient will:  Reduce symptoms of: anxiety and depression   Demonstrate ability to: Increase healthy adjustment to current life circumstances  Progress towards Goals: Ongoing  Interventions: Interventions utilized:  CBT Cognitive Behavioral Therapy and Supportive Counseling Standardized Assessments completed: Not Needed   CBT today to process patient's thoughts about self in context of negative messages she hears  from her sister. Patient exhibits increased ability to notice positive and kind feedback from others while giving less weight to negative messaging. Patient exhibits increased ability to notice negative emotions in her self and use self-soothing and coping skills.   Patient and/or Family Response: Patient engaged in session.   Assessment: Patient currently experiencing anxiety, depression, schizophrenia and PTSD. Patient experiencing negative thoughts about self in context of these challenges. Patient also experiencing challenges navigating systems to access resources.   Patient may benefit from continued therapy including CBT to explore unhelpful thoughts about self in context of family dynamics, and mindfulness for coping with elevated emotions.  Plan Follow up with behavioral health clinician on: 2/8//23 Referral(s): [facility] (In Clinic)  I discussed the assessment and treatment plan with the patient and/or parent/guardian. They were provided an opportunity to ask questions and all were answered. They agreed with the plan and demonstrated an understanding of the instructions.   They were advised to call back or seek an in-person evaluation if the symptoms worsen or if the condition fails to improve as anticipated.  Abigail Butts, LCSW  [facility] [phone]

## 2021-07-07 ENCOUNTER — Ambulatory Visit (INDEPENDENT_AMBULATORY_CARE_PROVIDER_SITE_OTHER): Payer: Medicaid Other | Admitting: Clinical

## 2021-07-07 ENCOUNTER — Other Ambulatory Visit: Payer: Self-pay

## 2021-07-07 DIAGNOSIS — F4310 Post-traumatic stress disorder, unspecified: Secondary | ICD-10-CM

## 2021-07-07 DIAGNOSIS — F332 Major depressive disorder, recurrent severe without psychotic features: Secondary | ICD-10-CM

## 2021-07-07 DIAGNOSIS — F200 Paranoid schizophrenia: Secondary | ICD-10-CM

## 2021-07-07 DIAGNOSIS — F419 Anxiety disorder, unspecified: Secondary | ICD-10-CM

## 2021-07-07 NOTE — BH Specialist Note (Addendum)
Integrated Behavioral Health via Telemedicine Visit  07/07/2021 Theresa Olson [id number]  Number of Integrated Behavioral Health visits: 61/80 Session Start time: 3:00  Session End time: 3:45 Total time: 45  Referring Provider: Raliegh Ip, NP Patient/Family location: [address], [place] (daughter's home) [facility] Provider location: [facility] All persons participating in visit: CSW, patient Types of Service: Individual psychotherapy and Telephone visit  I connected with Theresa Olson via Telephone and verified that I am speaking with the correct person using two identifiers. Discussed confidentiality: Yes   I discussed the limitations of telemedicine and the availability of in person appointments.  Discussed there is a possibility of technology failure and discussed alternative modes of communication if that failure occurs.  I discussed that engaging in this telemedicine visit, they consent to the provision of behavioral healthcare and the services will be billed under their insurance.  Patient and/or legal guardian expressed understanding and consented to Telemedicine visit: Yes   Presenting Concerns: Patient and/or family reports the following symptoms/concerns: depression and anxiety Duration of problem: approx 30 years; Severity of problem: moderate  Patient and/or Family's Strengths/Protective Factors: Physical Health (exercise, healthy diet, medication compliance, etc.) and empathy, religious beliefs  Goals Addressed: Patient will:  Reduce symptoms of: anxiety and depression   Demonstrate ability to: Increase healthy adjustment to current life circumstances  Progress towards Goals: Ongoing  Interventions: Interventions utilized:  Mindfulness or Relaxation Training and Supportive Counseling Standardized Assessments completed: Not Needed   Supportive counseling today to process dynamics with adult daughter and her husband; patient has been  living with them since losing her housing last year. They are requesting that patient start working if she is to continue staying with them. Due to her mental health conditions, patient has historically experienced challenges in maintaining employment. Patient continues to await social security disability determination. Mindfulness/neuro-respiratory integration exercise practiced today for calming and grounding.   Patient requested follow up appointment with her PCP here at [facility]). Assisted patient in scheduling this. CSW to also follow up with patient during this visit.  Patient and/or Family Response: Patient engaged in session.   Assessment: Patient currently experiencing anxiety, depression, schizophrenia and PTSD. Patient experiencing negative thoughts about self in context of these challenges. Patient also experiencing challenges navigating systems to access resources.   Patient may benefit from continued therapy including CBT to explore unhelpful thoughts about self in context of family dynamics, and mindfulness for coping with elevated emotions.  Plan Follow up with behavioral health clinician on: 07/12/21 Referral(s): Integrated [facility] [facility] (In Clinic)  I discussed the assessment and treatment plan with the patient and/or parent/guardian. They were provided an opportunity to ask questions and all were answered. They agreed with the plan and demonstrated an understanding of the instructions.   They were advised to call back or seek an in-person evaluation if the symptoms worsen or if the condition fails to improve as anticipated.  Abigail Butts, LCSW  [facility] [phone]

## 2021-07-12 ENCOUNTER — Telehealth: Payer: Self-pay | Admitting: Family Medicine

## 2021-07-12 ENCOUNTER — Ambulatory Visit: Payer: Self-pay | Admitting: Nurse Practitioner

## 2021-07-12 ENCOUNTER — Ambulatory Visit: Payer: Self-pay | Admitting: Clinical

## 2021-07-12 NOTE — Telephone Encounter (Signed)
Error

## 2021-07-22 ENCOUNTER — Ambulatory Visit (INDEPENDENT_AMBULATORY_CARE_PROVIDER_SITE_OTHER): Payer: Medicaid Other | Admitting: Nurse Practitioner

## 2021-07-22 ENCOUNTER — Ambulatory Visit (INDEPENDENT_AMBULATORY_CARE_PROVIDER_SITE_OTHER): Payer: Medicaid Other | Admitting: Clinical

## 2021-07-22 ENCOUNTER — Other Ambulatory Visit: Payer: Self-pay

## 2021-07-22 VITALS — BP 121/85 | HR 76 | Temp 98.1°F | Ht 60.0 in | Wt 176.0 lb

## 2021-07-22 DIAGNOSIS — I10 Essential (primary) hypertension: Secondary | ICD-10-CM

## 2021-07-22 DIAGNOSIS — Z1239 Encounter for other screening for malignant neoplasm of breast: Secondary | ICD-10-CM

## 2021-07-22 DIAGNOSIS — F332 Major depressive disorder, recurrent severe without psychotic features: Secondary | ICD-10-CM

## 2021-07-22 DIAGNOSIS — F419 Anxiety disorder, unspecified: Secondary | ICD-10-CM

## 2021-07-22 DIAGNOSIS — F200 Paranoid schizophrenia: Secondary | ICD-10-CM

## 2021-07-22 DIAGNOSIS — F4310 Post-traumatic stress disorder, unspecified: Secondary | ICD-10-CM

## 2021-07-22 DIAGNOSIS — E785 Hyperlipidemia, unspecified: Secondary | ICD-10-CM | POA: Diagnosis not present

## 2021-07-22 NOTE — Patient Instructions (Signed)

## 2021-07-22 NOTE — BH Specialist Note (Addendum)
Integrated Behavioral Health Follow Up In-Person Visit  MRN: [id number] Name: Theresa Olson  Number of [place] Clinician visits: Additional Visit  Session Start time: [id number]   Session End time: [id number]  Total time in minutes: 60   Types of Service: Individual psychotherapy  Interpretor:No. Interpretor Name and Language: none  Subjective: Theresa Olson is a 51 y.o. female accompanied by  self Patient was referred by Theresa Becton, NP for depression and anxiety. Patient reports the following symptoms/concerns: depression and anxiety Duration of problem: approx 30 years; Severity of problem: moderate  Objective: Mood: Anxious and Euthymic and Affect: Appropriate Risk of harm to self or others: No plan to harm self or others  Patient and/or Family's Strengths/Protective Factors: Physical Health (exercise, healthy diet, medication compliance, etc.) and empathy and religious beliefs  Goals Addressed: Patient will:  Reduce symptoms of: anxiety and depression   Increase knowledge and/or ability of: coping skills and self-management skills   Demonstrate ability to: Increase healthy adjustment to current life circumstances  Progress towards Goals: Ongoing  Interventions: Interventions utilized:  Mindfulness or Psychologist, educational, CBT Cognitive Behavioral Therapy, and Supportive Counseling Standardized Assessments completed: Not Needed  Supportive counseling and CBT today to explore anxiety related to recent changes in family expectations. Neuro-respiratory and neuromuscular integration exercise for calming and grounding to  cope with anxiety and post traumatic stress.  Patient and/or Family Response: Patient engaged in session.  Patient Centered Plan: Patient is on the following Treatment Plan(s): CBT to explore and manage anxiety, mindfulness/neuromuscular & neuro-respiratory integration for recovery from post traumatic stress  symptoms  Assessment: Patient currently experiencing anxiety, depression, schizophrenia and PTSD. Patient experiencing negative thoughts about self in context of these challenges. Patient also experiencing challenges navigating systems to access resources.   Patient may benefit from continued therapy including CBT to explore unhelpful thoughts about self in context of family dynamics, and mindfulness for coping with elevated emotions.  Plan: Follow up with behavioral health clinician on: next week via phone Referral(s): [facility] (In Clinic)  Theresa Olson, [facility] [phone]

## 2021-07-22 NOTE — Progress Notes (Signed)
[name],   [id number] Phone:  [phone]   Fax:  [phone]   Established Patient Office Visit  Subjective:  Patient ID: Theresa Olson, female    DOB: 20-Oct-1970  Age: 51 y.o. MRN: [id number]  CC:  Chief Complaint  Patient presents with   Follow-up    Pt is here for follow up visit. Pt would like a referral for a mammogram     HPI Theresa Olson presents for follow up. She  has a past medical history of Anxiety, Anxiety disorder, Carpal tunnel syndrome, Depression, Hot flashes, Hyperlipidemia (10/2019), Hypertension, Insomnia, and Vitamin D deficiency (12/2018).   Theresa Olson is in today for follow up for Hypertension. The current prescribed treatment is amlodipine 5 mg.  Compliance is reported . The  DASH diet is being followed.  She has had a significant weight loss.  There is a goal to maintain normal blood pressure. Denies headache, dizziness, visual changes, shortness of breath, dyspnea on exertion, chest pain, nausea, vomiting or any edema.  She does continue ongoing counseling with clinical Education officer, [employer].  She is also on Abilify 10 mg, Cymbalta 60 mg, hydroxyzine 25 mg for her major depression and anxiety.   Past Medical History:  Diagnosis Date   Anxiety    Anxiety disorder    Carpal tunnel syndrome    Depression    Hot flashes    Hyperlipidemia 10/2019   Hypertension    Insomnia    Vitamin D deficiency 12/2018    Past Surgical History:  Procedure Laterality Date   CESAREAN SECTION      Family History  Problem Relation Age of Onset   Hyperlipidemia Mother    Diabetes Mother    Hyperlipidemia Father    Diabetes Father    Heart disease Father     Social History   Socioeconomic History   Marital status: Single    Spouse name: Not on file   Number of children: Not on file   Years of education: Not on file   Highest education level: Not on file  Occupational History   Not on file  Tobacco Use    Smoking status: Former    Types: Cigarettes    Quit date: 08/28/2016    Years since quitting: 4.9   Smokeless tobacco: Never  Substance and Sexual Activity   Alcohol use: Yes    Comment: 2-3 glasses of vodka per day   Drug use: Yes    Types: Marijuana    Comment: smokes every few days   Sexual activity: Yes    Birth control/protection: None  Other Topics Concern   Not on file  Social History Narrative   Not on file   Social Determinants of Health   Financial Resource Strain: Not on file  Food Insecurity: Not on file  Transportation Needs: Not on file  Physical Activity: Not on file  Stress: Not on file  Social Connections: Not on file  Intimate Partner Violence: Not on file    Outpatient Medications Prior to Visit  Medication Sig Dispense Refill   amLODipine (NORVASC) 5 MG tablet Take 1 tablet (5 mg total) by mouth daily. 90 tablet 3   ARIPiprazole (ABILIFY) 10 MG tablet Take 1 tablet (10 mg total) by mouth daily. 30 tablet 2   atorvastatin (LIPITOR) 10 MG tablet Take 1 tablet (10 mg total) by mouth daily. 90 tablet 3   DULoxetine (CYMBALTA) 60 MG capsule Take one capsule daily  30 capsule 2   hydrOXYzine (ATARAX) 25 MG tablet Take 1 tablet (25 mg total) by mouth 3 (three) times daily as needed for anxiety. 90 tablet 2   vitamin C (ASCORBIC ACID) 500 MG tablet Take 500 mg by mouth every morning.     GLUTATHIONE PO Take 1 tablet by mouth daily. (Patient not taking: Reported on 07/22/2021)     Methylsulfonylmethane (MSM) 1000 MG CAPS Take 1,000 mg by mouth daily. (Patient not taking: Reported on 07/22/2021)     No facility-administered medications prior to visit.    Allergies  Allergen Reactions   Zoloft [Sertraline Hcl] Swelling and Hypertension    ROS Review of Systems    Objective:    Physical Exam  BP 121/85    Pulse 76    Temp 98.1 F (36.7 C)    Ht 5' (1.524 m)    Wt 176 lb 0.4 oz (79.8 kg)    LMP 04/18/2017    SpO2 98%    BMI 34.38 kg/m  Wt Readings from  Last 3 Encounters:  07/22/21 176 lb 0.4 oz (79.8 kg)  10/01/20 187 lb (84.8 kg)  03/28/20 185 lb (83.9 kg)     Health Maintenance Due  Topic Date Due   PAP SMEAR-Modifier  06/04/2016   MAMMOGRAM  Never done   Zoster Vaccines- Shingrix (1 of 2) Never done   INFLUENZA VACCINE  12/28/2020    There are no preventive care reminders to display for this patient.  Lab Results  Component Value Date   TSH 1.050 11/08/2019   Lab Results  Component Value Date   WBC 9.5 03/28/2020   HGB 13.1 03/28/2020   HCT 39.4 03/28/2020   MCV 92.1 03/28/2020   PLT 259 03/28/2020   Lab Results  Component Value Date   NA 144 10/01/2020   K 3.7 10/01/2020   CO2 25 03/28/2020   GLUCOSE 124 (H) 10/01/2020   BUN 9 10/01/2020   CREATININE 0.88 10/01/2020   BILITOT 0.2 10/01/2020   ALKPHOS 101 10/01/2020   AST 26 10/01/2020   ALT 27 03/28/2020   PROT 7.1 10/01/2020   ALBUMIN 4.2 10/01/2020   CALCIUM 9.2 10/01/2020   ANIONGAP 10 03/28/2020   EGFR 80 10/01/2020   Lab Results  Component Value Date   CHOL 232 (H) 10/01/2020   Lab Results  Component Value Date   HDL 57 10/01/2020   Lab Results  Component Value Date   LDLCALC 138 (H) 10/01/2020   Lab Results  Component Value Date   TRIG 207 (H) 10/01/2020   Lab Results  Component Value Date   CHOLHDL 4.1 10/01/2020   Lab Results  Component Value Date   HGBA1C 6.5 (A) 10/01/2020   HGBA1C 6.5 10/01/2020   HGBA1C 6.5 (A) 10/01/2020   HGBA1C 6.5 10/01/2020      Assessment & Plan:   Problem List Items Addressed This Visit   None Visit Diagnoses     Primary hypertension    -  Primary Stable Encouraged on going compliance with current medication regimen Encouraged home monitoring and recording BP <130/80 Eating a heart-healthy diet with less salt Encouraged regular physical activity  Recommend Weight loss .   Relevant Orders   Comp. Metabolic Panel (12) (Completed)   Major depressive disorder, recurrent severe without  psychotic features ([place])   (Chronic)   Stable continue current regimen and counseling   Breast screening     Discussed female health maintenance; SBE, annual CBE, PAP test  Relevant Orders   MM 3D SCREEN BREAST BILATERAL   Hyperlipidemia, unspecified hyperlipidemia type     Stable  Encouraged on going compliance with current medication regimen. I recommend eating a heart-healthy diet; low in fat and cholesterol along with regular exercise. This will promote weight loss and improve cholesterol.     Relevant Orders   Lipid panel (Completed)       No orders of the defined types were placed in this encounter.   Follow-up: Return for Physcial VISIT,EST,40-64 [97949] per patients choice .    Vevelyn Francois, NP

## 2021-07-23 ENCOUNTER — Encounter: Payer: Self-pay | Admitting: Nurse Practitioner

## 2021-07-23 LAB — COMP. METABOLIC PANEL (12)
AST: 18 IU/L (ref 0–40)
Albumin/Globulin Ratio: 1.9 (ref 1.2–2.2)
Albumin: 4.7 g/dL (ref 3.8–4.9)
Alkaline Phosphatase: 109 IU/L (ref 44–121)
BUN/Creatinine Ratio: 19 (ref 9–23)
BUN: 16 mg/dL (ref 6–24)
Bilirubin Total: 0.4 mg/dL (ref 0.0–1.2)
Calcium: 9.8 mg/dL (ref 8.7–10.2)
Chloride: 104 mmol/L (ref 96–106)
Creatinine, Ser: 0.84 mg/dL (ref 0.57–1.00)
Globulin, Total: 2.5 g/dL (ref 1.5–4.5)
Glucose: 78 mg/dL (ref 70–99)
Potassium: 4.2 mmol/L (ref 3.5–5.2)
Sodium: 142 mmol/L (ref 134–144)
Total Protein: 7.2 g/dL (ref 6.0–8.5)
eGFR: 84 mL/min/{1.73_m2} (ref >59)

## 2021-07-23 LAB — LIPID PANEL
Chol/HDL Ratio: 2.5 ratio (ref 0.0–4.4)
Cholesterol, Total: 156 mg/dL (ref 100–199)
HDL: 62 mg/dL (ref >39)
LDL Chol Calc (NIH): 69 mg/dL (ref 0–99)
Triglycerides: 147 mg/dL (ref 0–149)
VLDL Cholesterol Cal: 25 mg/dL (ref 5–40)

## 2021-07-29 ENCOUNTER — Telehealth: Payer: Self-pay | Admitting: Clinical

## 2021-07-29 NOTE — Telephone Encounter (Addendum)
Integrated Behavioral Health General Follow Up Note  07/29/2021 Name: Theresa Olson MRN: [id number] DOB: 05-27-1971 Theresa Olson is a 51 y.o. year old female who sees Vevelyn Francois, NP for primary care. LCSW follows patient for [place] ([facility]) counseling.  Interpreter: No.   Interpreter Name & Language: none  Assessment: Patient experiencing anxiety, depression, PTSD, and schizophrenia.  Ongoing Intervention: Called patient today and called [facility] together to make referral for patient to targeted case management and care coordination. Patient needs assistance in navigating social service systems., managing medications, and transportation. [place] ([facility]) appointment for 08/04/21.  Review of patient status, including review of consultants reports, relevant laboratory and other test results, and collaboration with appropriate care team members and the patient's provider was performed as part of comprehensive patient evaluation and provision of services.    Estanislado Emms, [facility] [phone]

## 2021-08-04 ENCOUNTER — Ambulatory Visit (INDEPENDENT_AMBULATORY_CARE_PROVIDER_SITE_OTHER): Payer: Medicaid Other | Admitting: Clinical

## 2021-08-04 ENCOUNTER — Other Ambulatory Visit: Payer: Self-pay

## 2021-08-04 DIAGNOSIS — F4310 Post-traumatic stress disorder, unspecified: Secondary | ICD-10-CM | POA: Diagnosis not present

## 2021-08-04 DIAGNOSIS — F419 Anxiety disorder, unspecified: Secondary | ICD-10-CM

## 2021-08-04 DIAGNOSIS — F332 Major depressive disorder, recurrent severe without psychotic features: Secondary | ICD-10-CM

## 2021-08-04 DIAGNOSIS — F200 Paranoid schizophrenia: Secondary | ICD-10-CM

## 2021-08-04 NOTE — BH Specialist Note (Addendum)
Integrated Behavioral Health Follow Up In-Person Visit  MRN: [id number] Name: Theresa Olson  Number of Integrated Behavioral Health Clinician visits: Additional Visit  Session Start time: 1400   Session End time: 1500  Total time in minutes: 60   Types of Service: Individual psychotherapy  Interpretor:No. Interpretor Name and Language: none  Subjective: Theresa Olson is a 51 y.o. female accompanied by  self Patient was referred by Thad Ranger, NP for depression and anxiety. Patient reports the following symptoms/concerns: depression and anxiety Duration of problem: approx 30 years; Severity of problem: moderate  Objective: Mood: Euthymic and Affect: Appropriate Risk of harm to self or others: No plan to harm self or others  Patient and/or Family's Strengths/Protective Factors: Social connections, Concrete supports in place (healthy food, safe environments, etc.), and Physical Health (exercise, healthy diet, medication compliance, etc.)  Goals Addressed: Patient will:  Reduce symptoms of: anxiety and depression   Increase knowledge and/or ability of: coping skills and self-management skills   Demonstrate ability to: Increase healthy adjustment to current life circumstances  Progress towards Goals: Ongoing  Interventions: Interventions utilized:  Mindfulness or Management consultant and CBT Cognitive Behavioral Therapy Standardized Assessments completed: Not Needed  CBT today to explore patient's thoughts and emotions related to recently starting part time work, and living situation with her daughter and son-in-law. Mindfulness practice for cultivating self compassion in service of challenging negative automatic thoughts about self.   Patient and/or Family Response: Patient engaged in session.   Assessment: Patient currently experiencing anxiety, depression, schizophrenia and PTSD. Patient experiencing negative thoughts about self in context of these challenges.  Patient also experiencing challenges navigating systems to access resources.   Patient may benefit from continued therapy including CBT to explore unhelpful thoughts about self in context of family dynamics, and mindfulness for coping with elevated emotions.  Plan: Follow up with behavioral health clinician on: 08/18/21 Referral(s): Integrated [employer] (In Clinic)  Abigail Butts, LCSW [facility] [phone]

## 2021-08-18 ENCOUNTER — Ambulatory Visit (INDEPENDENT_AMBULATORY_CARE_PROVIDER_SITE_OTHER): Payer: Medicaid Other | Admitting: Clinical

## 2021-08-18 ENCOUNTER — Other Ambulatory Visit: Payer: Self-pay

## 2021-08-18 DIAGNOSIS — F4310 Post-traumatic stress disorder, unspecified: Secondary | ICD-10-CM

## 2021-08-18 DIAGNOSIS — F332 Major depressive disorder, recurrent severe without psychotic features: Secondary | ICD-10-CM

## 2021-08-18 DIAGNOSIS — F419 Anxiety disorder, unspecified: Secondary | ICD-10-CM

## 2021-08-18 DIAGNOSIS — F200 Paranoid schizophrenia: Secondary | ICD-10-CM

## 2021-08-18 NOTE — BH Specialist Note (Addendum)
Integrated Behavioral Health Follow Up In-Person Visit  MRN: [id number] Name: Theresa Olson  Number of Integrated Behavioral Health Clinician visits: Additional Visit  Session Start time: 1410   Session End time: 1500  Total time in minutes: 50   Types of Service: Individual psychotherapy  Interpretor:No. Interpretor Name and Language: none  Subjective: Theresa Olson is a 51 y.o. female accompanied by  self Patient was referred by Thad Ranger, NP for depression and anxiety. Patient reports the following symptoms/concerns: depression and anxiety Duration of problem: approx 30 years; Severity of problem: moderate  Objective: Mood: Euthymic and Affect: Appropriate Risk of harm to self or others: No plan to harm self or others  Patient and/or Family's Strengths/Protective Factors: Social connections, Concrete supports in place (healthy food, safe environments, etc.), and Physical Health (exercise, healthy diet, medication compliance, etc.)  Goals Addressed: Patient will:  Reduce symptoms of: anxiety and depression   Increase knowledge and/or ability of: coping skills and self-management skills   Demonstrate ability to: Increase healthy adjustment to current life circumstances  Progress towards Goals: Ongoing  Interventions: Interventions utilized:  Supportive Counseling and Link to [employer] Standardized Assessments completed: Not Needed  Supportive counseling today around adjusting to patient's PCP leaving the practice and around patient adjusting to working. Called DSS together with patient to follow up on getting patient assessed for transportation services. DSS indicated patient has a managed Medicaid plan; patient was not aware of this. CSW to follow and assist in clarifying patient's coverage.  Patient and/or Family Response: Patient engaged in session.  Assessment: Patient currently experiencing anxiety, depression, schizophrenia and PTSD. Patient  experiencing negative thoughts about self in context of these challenges. Patient also experiencing challenges navigating systems to access resources.   Patient may benefit from continued therapy including CBT to explore unhelpful thoughts about self in context of family dynamics, and mindfulness for coping with elevated emotions.  Plan: Follow up with behavioral health clinician on: 09/01/21 Referral(s): Integrated [employer] (In Clinic)  Abigail Butts, LCSW [facility] [phone]

## 2021-08-25 ENCOUNTER — Telehealth: Payer: Self-pay | Admitting: Clinical

## 2021-08-25 NOTE — Telephone Encounter (Addendum)
Integrated Behavioral Health General Follow Up Note  08/25/2021 Name: Theresa Olson MRN: [id number] DOB: [birth year] Theresa Olson is a 51 y.o. year old female who sees Barbette Merino, NP for primary care. LCSW follows patient for Integrated Behavioral Health (IBH) counseling.  Interpreter: No.   Interpreter Name & Language: none  Assessment: Patient experiencing anxiety, depression, PTSD, and schizophrenia.  Ongoing Intervention: Called Medicaid [employer] [place] and DSS together with client in an attempt to figure out her coverage for transportation services. DSS had previously indicated patient does not have direct Medicaid, she has [employer] managed Medicaid. [employer] has indicated that patient's coverage with them ended 04/29/21. Another call to DSS today revealed that patient does not have an active Medicaid case. Caseworker to call CSW to discuss further. CSW to follow and assist patient in accessing coverage again.  Review of patient status, including review of consultants reports, relevant laboratory and other test results, and collaboration with appropriate care team members and the patient's provider was performed as part of comprehensive patient evaluation and provision of services.    Abigail Butts, LCSW [facility] [phone]

## 2021-08-26 ENCOUNTER — Telehealth: Payer: Self-pay | Admitting: Clinical

## 2021-08-26 NOTE — Telephone Encounter (Addendum)
Integrated Behavioral Health General Follow Up Note  08/26/2021 Name: Theresa Olson MRN: [id number] DOB: 15-Jul-1970 Theresa Olson is a 51 y.o. year old female who sees Barbette Merino, NP for primary care. LCSW follows patient for Integrated Behavioral Health (IBH) counseling.  Interpreter: No.   Interpreter Name & Language: none  Assessment: Patient experiencing anxiety, depression, PTSD, and schizophrenia.  Ongoing Intervention: Patient called CSW and reported that a DSS caseworker had called her and discussed possible re-certification of her coverage. Patient's attorney with [employer] also called to coordinate with CSW regarding this issue. As [employer] has the ombudsmen services, and patient's attorney currently assisting with an SSI/SSDI claim for patient, they will also follow to assist patient in having coverage reactivated.  Review of patient status, including review of consultants reports, relevant laboratory and other test results, and collaboration with appropriate care team members and the patient's provider was performed as part of comprehensive patient evaluation and provision of services.    Abigail Butts, LCSW [facility] [phone]

## 2021-09-01 ENCOUNTER — Ambulatory Visit: Payer: Medicaid Other | Admitting: Clinical

## 2021-09-01 ENCOUNTER — Telehealth: Payer: Self-pay | Admitting: Clinical

## 2021-09-01 NOTE — Telephone Encounter (Addendum)
Integrated Behavioral Health General Follow Up Note  09/01/2021 Name: Taija Mathias MRN: [id number] DOB: 1971-02-06 Ninette Cotta is a 51 y.o. year old female who sees Barbette Merino, NP for primary care. LCSW follows patient for Integrated Behavioral Health (IBH) counseling.  Interpreter: No.   Interpreter Name & Language: none  Assessment: Patient experiencing anxiety, depression, PTSD, and schizophrenia.  Ongoing Intervention: CSW called patient to check in on Medicaid issues, as it was discovered last week that her coverage was inactive. Patient reported she had spoken to a caseworker at DSS and completed and returned some paperwork regarding her Medicaid. CSW to follow and assist patient as needed in confirming if coverage is active again.  Brief supportive counseling provided. Patient feeling overwhelmed by navigating social service systems. Reminded patient of the supports she has, including CSW and [employer], as well as progress patient has been able to make herself.  Review of patient status, including review of consultants reports, relevant laboratory and other test results, and collaboration with appropriate care team members and the patient's provider was performed as part of comprehensive patient evaluation and provision of services.    Abigail Butts, LCSW [facility] [phone]

## 2021-09-05 ENCOUNTER — Other Ambulatory Visit (HOSPITAL_COMMUNITY): Payer: Self-pay | Admitting: Psychiatry

## 2021-09-05 DIAGNOSIS — F4310 Post-traumatic stress disorder, unspecified: Secondary | ICD-10-CM

## 2021-09-05 DIAGNOSIS — F200 Paranoid schizophrenia: Secondary | ICD-10-CM

## 2021-09-09 ENCOUNTER — Telehealth: Payer: Self-pay | Admitting: Clinical

## 2021-09-09 ENCOUNTER — Encounter (HOSPITAL_COMMUNITY): Payer: Self-pay | Admitting: Psychiatry

## 2021-09-09 ENCOUNTER — Telehealth (HOSPITAL_BASED_OUTPATIENT_CLINIC_OR_DEPARTMENT_OTHER): Payer: Medicaid Other | Admitting: Psychiatry

## 2021-09-09 DIAGNOSIS — F200 Paranoid schizophrenia: Secondary | ICD-10-CM | POA: Diagnosis not present

## 2021-09-09 DIAGNOSIS — F419 Anxiety disorder, unspecified: Secondary | ICD-10-CM | POA: Diagnosis not present

## 2021-09-09 DIAGNOSIS — F4310 Post-traumatic stress disorder, unspecified: Secondary | ICD-10-CM | POA: Diagnosis not present

## 2021-09-09 MED ORDER — ARIPIPRAZOLE 10 MG PO TABS: 10.0000 mg | ORAL_TABLET | Freq: Every day | ORAL | 2 refills | Status: DC

## 2021-09-09 MED ORDER — DULOXETINE HCL 60 MG PO CPEP: ORAL_CAPSULE | ORAL | 2 refills | Status: DC

## 2021-09-09 MED ORDER — HYDROXYZINE HCL 25 MG PO TABS: 25.0000 mg | ORAL_TABLET | Freq: Three times a day (TID) | ORAL | 2 refills | Status: DC | PRN

## 2021-09-09 NOTE — Telephone Encounter (Addendum)
Integrated Behavioral Health General Follow Up Note  09/09/2021 Name: Theresa Olson MRN: [id number] DOB: 09/01/1970 Theresa Olson is a 51 y.o. year old female who sees Barbette Merino, NP for primary care. LCSW follows patient for Integrated Behavioral Health (IBH) counseling.  Interpreter: No.   Interpreter Name & Language: none  Assessment: Patient experiencing anxiety, depression, PTSD, and schizophrenia.  Ongoing Intervention: CSW called patient to check in and assist with follow up with psychiatry. Called [facility] to confirm next appointment with Dr. Lolly Mustache; turns out it is today at 2:00. Advised patient of this.  Review of patient status, including review of consultants reports, relevant laboratory and other test results, and collaboration with appropriate care team members and the patient's provider was performed as part of comprehensive patient evaluation and provision of services.    Abigail Butts, LCSW Patient [facility] [phone]
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
## 2021-09-15 ENCOUNTER — Telehealth: Payer: Self-pay | Admitting: Clinical

## 2021-09-15 NOTE — Telephone Encounter (Addendum)
Integrated Behavioral Health General Follow Up Note  09/15/2021 Name: Evilyn Kazarian MRN: [id number] DOB: Jan 24, 1971 Peityn Medico is a 51 y.o. year old female who sees Vevelyn Francois, NP for primary care. LCSW follows patient for [place] (IBH) counseling.  Interpreter: No.   Interpreter Name & Language: none  Assessment: Patient experiencing anxiety, depression, PTSD, and schizophrenia.  Ongoing Intervention: CSW called patient to check in. Supportive counseling regarding stress around living situation and pending disability claim.   Review of patient status, including review of consultants reports, relevant laboratory and other test results, and collaboration with appropriate care team members and the patient's provider was performed as part of comprehensive patient evaluation and provision of services.    Estanislado Emms, [facility] [phone]

## 2021-09-22 ENCOUNTER — Telehealth: Payer: Self-pay | Admitting: Clinical

## 2021-09-22 NOTE — Telephone Encounter (Addendum)
Integrated Behavioral Health General Follow Up Note  09/22/2021 Name: Theresa Olson MRN: [id number] DOB: August 16, 1970 Theresa Olson is a 51 y.o. year old female who sees Barbette Merino, NP for primary care. LCSW follows patient for Integrated Behavioral Health (IBH) counseling.  Interpreter: No.   Interpreter Name & Language: none  Assessment: Patient experiencing anxiety, depression, PTSD, and schizophrenia.  Ongoing Intervention: CSW called patient to check in. Supportive counseling regarding relationship dynamics with her daughter and other family members. Patient reported she had gotten a call from disability determination to schedule an assessment.   Review of patient status, including review of consultants reports, relevant laboratory and other test results, and collaboration with appropriate care team members and the patient's provider was performed as part of comprehensive patient evaluation and provision of services.    Abigail Butts, LCSW Patient Care Center [facility] [phone]

## 2021-09-29 ENCOUNTER — Telehealth: Payer: Self-pay | Admitting: Clinical

## 2021-09-29 NOTE — Telephone Encounter (Addendum)
Integrated Behavioral Health General Follow Up Note  09/29/2021 Name: Theresa Olson MRN: [id number] DOB: 1971/04/30 [name] is a 51 y.o. year old female who sees Vevelyn Francois, NP for primary care. LCSW follows patient for [place] (IBH) counseling.  Interpreter: No.   Interpreter Name & Language: none  Assessment: Patient experiencing anxiety, depression, PTSD, and schizophrenia.  Ongoing Intervention: CSW and patient spoke over the phone today. Supportive counseling provided regarding patient's thought and emotions around family dynamics.   Patient's disability claim remains in process. She received notification that she will need to be examined by a physician assigned by disability determination and shared details of that appointment with CSW.   Patient indicated her Medicaid was reinstated. She will need assistance with transportation to appointments. CSW called DSS to confirm patient's coverage. Patient is covered. She needs a new transportation assessment. DSS worker to call her back to complete that.   Review of patient status, including review of consultants reports, relevant laboratory and other test results, and collaboration with appropriate care team members and the patient's provider was performed as part of comprehensive patient evaluation and provision of services.    Estanislado Emms, [facility] [phone]

## 2021-10-06 ENCOUNTER — Ambulatory Visit (INDEPENDENT_AMBULATORY_CARE_PROVIDER_SITE_OTHER): Payer: Medicaid Other | Admitting: Clinical

## 2021-10-06 DIAGNOSIS — F332 Major depressive disorder, recurrent severe without psychotic features: Secondary | ICD-10-CM | POA: Diagnosis not present

## 2021-10-06 DIAGNOSIS — F4310 Post-traumatic stress disorder, unspecified: Secondary | ICD-10-CM

## 2021-10-06 DIAGNOSIS — F200 Paranoid schizophrenia: Secondary | ICD-10-CM | POA: Diagnosis not present

## 2021-10-06 DIAGNOSIS — F419 Anxiety disorder, unspecified: Secondary | ICD-10-CM

## 2021-10-06 NOTE — BH Specialist Note (Signed)
Integrated Behavioral Health Follow Up In-Person Visit ? ?MRN: [id number] ?Name: Theresa Olson ? ?Number of Integrated Behavioral Health Clinician visits: Additional Visit ? ?Session Start time: 1330 ?  ?Session End time: 1430 ? ?Total time in minutes: 60 ? ? ?Types of Service: Individual psychotherapy ? ?Interpretor:No. Interpretor Name and Language: none ? ?Subjective: ?Theresa Olson is a 51 y.o. female accompanied by  self ?Patient was referred by Thad Ranger, NP for depression and anxiety. ?Patient reports the following symptoms/concerns: depression and anxiety ?Duration of problem: approx 30 years; Severity of problem: moderate ? ?Objective: ?MMood: Euthymic and Affect: Appropriate ?Risk of harm to self or others: No plan to harm self or others ? ?Patient and/or Family's Strengths/Protective Factors: ?Social connections, Concrete supports in place (healthy food, safe environments, etc.), and Physical Health (exercise, healthy diet, medication compliance, etc.) ? ?Goals Addressed: ?Patient will: ? Reduce symptoms of: anxiety and depression  ? Increase knowledge and/or ability of: coping skills and self-management skills  ? Demonstrate ability to: Increase healthy adjustment to current life circumstances ? ?Progress towards Goals: ?Ongoing ? ?Interventions: ?Interventions utilized:  Copywriter, advertising and CBT Cognitive Behavioral Therapy ?Standardized Assessments completed: Not Needed ? ?CBT today to explore and re-frame negative thoughts patient has about self. Explored patient's beliefs about self and messaging from family members. Patient also feeling nervous about a Education officer, community. She requested CSW to join by phone.  ? ?Patient struggles with feeling a sense of purpose. Discussed this today and the different things that can be important and meaningful in relationships and personal growth. Mindfulness/neuro-muscular and neuro-respiratory integration exercise practiced.  Patient participated and reported a benefit. ? ?Patient and/or Family Response: Patient engaged in session.  ? ?Assessment: ?Patient currently experiencing anxiety, depression, schizophrenia and PTSD. Patient experiencing negative thoughts about self in context of these challenges. Patient also experiencing challenges navigating systems to access resources. ?  ?Patient may benefit from continued therapy including CBT to explore unhelpful thoughts about self in context of family dynamics, and mindfulness for coping with elevated emotions. ? ?Plan: ?Follow up with behavioral health clinician on: 10/20/21 ?Referral(s): Integrated [employer] (In Clinic) ? ?Abigail Butts, LCSW ?Patient Care Center ?[facility] ?[phone] ? ? ?

## 2021-10-07 ENCOUNTER — Telehealth: Payer: Self-pay | Admitting: Clinical

## 2021-10-07 NOTE — Telephone Encounter (Addendum)
Integrated Behavioral Health General Follow Up Note  10/07/2021 Name: Reya Aurich MRN: [id number] DOB: Jan 18, 1971 Marge Vandermeulen is a 51 y.o. year old female who sees Barbette Merino, NP for primary care. LCSW follows patient for Integrated Behavioral Health (IBH) counseling.  Interpreter: No.   Interpreter Name & Language: none  Assessment: Patient experiencing anxiety, depression, PTSD, and schizophrenia.  Ongoing Intervention: Patient called CSW while Medical sales representative at her home for care management assessment. CSW coordinated with [facility] for assessment for additional community support.  Review of patient status, including review of consultants reports, relevant laboratory and other test results, and collaboration with appropriate care team members and the patient's provider was performed as part of comprehensive patient evaluation and provision of services.    Abigail Butts, LCSW Patient Care Center [facility] [phone]

## 2021-10-20 ENCOUNTER — Ambulatory Visit: Payer: Medicaid Other | Admitting: Clinical

## 2021-10-27 ENCOUNTER — Other Ambulatory Visit: Payer: Self-pay | Admitting: Nurse Practitioner

## 2021-10-27 DIAGNOSIS — I160 Hypertensive urgency: Secondary | ICD-10-CM

## 2021-10-27 DIAGNOSIS — E785 Hyperlipidemia, unspecified: Secondary | ICD-10-CM

## 2021-10-28 ENCOUNTER — Ambulatory Visit: Payer: Medicaid Other | Admitting: Clinical

## 2021-10-29 ENCOUNTER — Other Ambulatory Visit: Payer: Self-pay | Admitting: Nurse Practitioner

## 2021-10-29 DIAGNOSIS — E785 Hyperlipidemia, unspecified: Secondary | ICD-10-CM

## 2021-10-29 DIAGNOSIS — I160 Hypertensive urgency: Secondary | ICD-10-CM

## 2021-11-03 ENCOUNTER — Ambulatory Visit: Payer: Medicaid Other | Admitting: Clinical

## 2021-11-10 ENCOUNTER — Ambulatory Visit: Payer: Medicaid Other | Admitting: Clinical

## 2021-11-11 ENCOUNTER — Ambulatory Visit (INDEPENDENT_AMBULATORY_CARE_PROVIDER_SITE_OTHER): Payer: Medicaid Other | Admitting: Clinical

## 2021-11-11 DIAGNOSIS — F200 Paranoid schizophrenia: Secondary | ICD-10-CM

## 2021-11-11 DIAGNOSIS — F419 Anxiety disorder, unspecified: Secondary | ICD-10-CM

## 2021-11-11 DIAGNOSIS — F4310 Post-traumatic stress disorder, unspecified: Secondary | ICD-10-CM | POA: Diagnosis not present

## 2021-11-11 DIAGNOSIS — F332 Major depressive disorder, recurrent severe without psychotic features: Secondary | ICD-10-CM

## 2021-11-11 NOTE — BH Specialist Note (Signed)
Integrated Behavioral Health Follow Up In-Person Visit  MRN: [id number] Name: Theresa Olson  Number of Integrated Behavioral Health Clinician visits: Additional Visit  Session Start time: 1120   Session End time: 1210  Total time in minutes: 50   Types of Service: Individual psychotherapy  Interpretor:No. Interpretor Name and Language: none  Subjective: Theresa Olson is a 51 y.o. female accompanied by  self Patient was referred by Thad Ranger, NP for depression and anxiety. Patient reports the following symptoms/concerns: depression and anxiety Duration of problem: approx 30 years; Severity of problem: moderate  Objective: Mood: Euthymic and Affect: Appropriate Risk of harm to self or others: No plan to harm self or others  Patient and/or Family's Strengths/Protective Factors: Social connections, Concrete supports in place (healthy food, safe environments, etc.), and Physical Health (exercise, healthy diet, medication compliance, etc.)  Goals Addressed: Patient will:  Reduce symptoms of: anxiety and depression   Increase knowledge and/or ability of: coping skills and self-management skills   Demonstrate ability to: Increase healthy adjustment to current life circumstances  Progress towards Goals: Ongoing  Interventions: Interventions utilized:  CBT Cognitive Behavioral Therapy and Supportive Counseling Standardized Assessments completed: Not Needed  CBT today to explore patient's thoughts and beliefs about self in the context of relationship with her youngest daughter. Explored healthy communication options. Also brief psycho-education on eating and staying hydrated, as patient has been feeling tired throughout the day but skips meals when busy.  Patient and/or Family Response: Patient engaged in session.  Assessment: Patient currently experiencing anxiety, depression, schizophrenia and PTSD. Patient experiencing negative thoughts about self in context of these  challenges. Patient also experiencing challenges navigating systems to access resources.   Patient may benefit from continued therapy including CBT to explore unhelpful thoughts about self in context of family dynamics, and mindfulness for coping with elevated emotions.  Plan: Follow up with behavioral health clinician on: 11/25/21 Referral(s): Integrated [employer] (In Clinic)  Abigail Butts, LCSW [facility] [phone]

## 2021-11-23 ENCOUNTER — Other Ambulatory Visit: Payer: Self-pay | Admitting: Nurse Practitioner

## 2021-11-23 DIAGNOSIS — E785 Hyperlipidemia, unspecified: Secondary | ICD-10-CM

## 2021-11-23 DIAGNOSIS — I160 Hypertensive urgency: Secondary | ICD-10-CM

## 2021-11-25 ENCOUNTER — Ambulatory Visit (INDEPENDENT_AMBULATORY_CARE_PROVIDER_SITE_OTHER): Payer: Medicaid Other | Admitting: Clinical

## 2021-11-25 DIAGNOSIS — F419 Anxiety disorder, unspecified: Secondary | ICD-10-CM

## 2021-11-25 DIAGNOSIS — F332 Major depressive disorder, recurrent severe without psychotic features: Secondary | ICD-10-CM

## 2021-11-25 DIAGNOSIS — F200 Paranoid schizophrenia: Secondary | ICD-10-CM

## 2021-11-25 DIAGNOSIS — F4310 Post-traumatic stress disorder, unspecified: Secondary | ICD-10-CM

## 2021-11-25 NOTE — BH Specialist Note (Addendum)
Integrated Behavioral Health via Telemedicine Visit  11/25/2021 Theresa Olson [id number]  Number of Integrated Behavioral Health Clinician visits: Additional Visit  Session Start time: 1105   Session End time: 1135  Total time in minutes: 30   Referring Provider: Thad Ranger, NP Patient/Family location: [address] [place] (daughter's home) [facility] Provider location: [facility] All persons participating in visit: CSW, patient Types of Service: Individual psychotherapy and Telephone visit  I connected with Theresa Olson via Telephone and verified that I am speaking with the correct person using two identifiers. Discussed confidentiality: Yes   I discussed the limitations of telemedicine and the availability of in person appointments.  Discussed there is a possibility of technology failure and discussed alternative modes of communication if that failure occurs.  I discussed that engaging in this telemedicine visit, they consent to the provision of behavioral healthcare and the services will be billed under their insurance.  Patient and/or legal guardian expressed understanding and consented to Telemedicine visit: Yes   Presenting Concerns: Patient and/or family reports the following symptoms/concerns: depression and anxiety Duration of problem: approx 30 years; Severity of problem: moderate  Patient and/or Family's Strengths/Protective Factors: Social connections and Concrete supports in place (healthy food, safe environments, etc.)  Goals Addressed: Patient will:  Reduce symptoms of: anxiety and depression   Increase knowledge and/or ability of: coping skills and self-management skills   Demonstrate ability to: Increase healthy adjustment to current life circumstances  Progress towards Goals: Ongoing  Interventions: Interventions utilized:  Solution-Focused Strategies and Supportive Counseling Standardized Assessments completed: Not  Needed  Patient working part time but finds it very challenging; she becomes frustrated and overwhelmed on several shifts. Patient also reports previously not eating or drinking water during her shifts. Discussed last session on the importance of this. Reviewed today and brainstormed ideas to remember to attend to these needs. Patient has increased the number or work days that she brings food and water to work.Also processed patient's emotions around work and the associated fatigue. Discussed ways that patient can add a fulfilling or relaxing activity to her day besides sleeping.  Patient and/or Family Response: Patient engaged in session.  Assessment: Patient currently experiencing anxiety, depression, schizophrenia, and PTSD. Patient experiencing challenges in managing daily physical needs while working.  Patient may benefit from education and solutions focused interventions to improve awareness of physical needs and how to remember to address them. She may also benefit from continued therapy including CBT to explore unhelpful thoughts about self in context of family dynamics, and mindfulness for coping with elevated emotions.  Plan: Follow up with behavioral health clinician on: 12/09/21 Referral(s): Integrated [employer] (In Clinic)  I discussed the assessment and treatment plan with the patient and/or parent/guardian. They were provided an opportunity to ask questions and all were answered. They agreed with the plan and demonstrated an understanding of the instructions.   They were advised to call back or seek an in-person evaluation if the symptoms worsen or if the condition fails to improve as anticipated.  Abigail Butts, LCSW [facility] [phone]

## 2021-12-09 ENCOUNTER — Telehealth (HOSPITAL_BASED_OUTPATIENT_CLINIC_OR_DEPARTMENT_OTHER): Payer: Medicaid Other | Admitting: Psychiatry

## 2021-12-09 ENCOUNTER — Encounter (HOSPITAL_COMMUNITY): Payer: Self-pay | Admitting: Psychiatry

## 2021-12-09 ENCOUNTER — Ambulatory Visit (INDEPENDENT_AMBULATORY_CARE_PROVIDER_SITE_OTHER): Payer: Medicare Other | Admitting: Clinical

## 2021-12-09 DIAGNOSIS — F419 Anxiety disorder, unspecified: Secondary | ICD-10-CM

## 2021-12-09 DIAGNOSIS — F4310 Post-traumatic stress disorder, unspecified: Secondary | ICD-10-CM | POA: Diagnosis not present

## 2021-12-09 DIAGNOSIS — F200 Paranoid schizophrenia: Secondary | ICD-10-CM | POA: Diagnosis not present

## 2021-12-09 DIAGNOSIS — F332 Major depressive disorder, recurrent severe without psychotic features: Secondary | ICD-10-CM

## 2021-12-09 MED ORDER — ARIPIPRAZOLE 10 MG PO TABS: 10.0000 mg | ORAL_TABLET | Freq: Every day | ORAL | 2 refills | Status: DC

## 2021-12-09 MED ORDER — DULOXETINE HCL 60 MG PO CPEP
ORAL_CAPSULE | ORAL | 2 refills | Status: DC
Start: 2021-12-09 — End: 2022-03-10

## 2021-12-09 MED ORDER — HYDROXYZINE HCL 25 MG PO TABS: 25.0000 mg | ORAL_TABLET | Freq: Three times a day (TID) | ORAL | 2 refills | Status: DC | PRN
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
## 2021-12-09 NOTE — BH Specialist Note (Addendum)
Integrated Behavioral Health via Telemedicine Visit  12/09/2021 Theresa Olson [id number]  Number of Integrated Behavioral Health Clinician visits: Additional Visit  Session Start time: 1300   Session End time: 1315  Total time in minutes: 15  Referring Provider: Thad Ranger, NP Patient/Family location: [address], [place] (daughter's home) [facility] Provider location: [facility] All persons participating in visit: CSW, patient Types of Service: Individual psychotherapy and Telephone visit  I connected with [name] via Telephone and verified that I am speaking with the correct person using two identifiers. Discussed confidentiality: Yes   I discussed the limitations of telemedicine and the availability of in person appointments.  Discussed there is a possibility of technology failure and discussed alternative modes of communication if that failure occurs.  I discussed that engaging in this telemedicine visit, they consent to the provision of behavioral healthcare and the services will be billed under their insurance.  Patient and/or legal guardian expressed understanding and consented to Telemedicine visit: Yes   Presenting Concerns: Patient and/or family reports the following symptoms/concerns: depression and anxiety Duration of problem: approx 30 years; Severity of problem: moderate  Patient and/or Family's Strengths/Protective Factors: Social connections and Concrete supports in place (healthy food, safe environments, etc.)  Goals Addressed: Patient will:  Reduce symptoms of: anxiety and depression   Increase knowledge and/or ability of: coping skills and self-management skills   Demonstrate ability to: Increase healthy adjustment to current life circumstances  Progress towards Goals: Ongoing  Interventions: Interventions utilized:  Supportive Counseling Standardized Assessments completed: Not Needed  Supportive counseling today as patient  has recently been in touch with her brother via mail. Processed emotions around this.  Patient and/or Family Response: Patient engaged in session.  Assessment: Patient currently experiencing anxiety, depression, schizophrenia, and PTSD. Patient experiencing challenges in managing daily physical needs while working.   Patient may benefit from education and solutions focused interventions to improve awareness of physical needs and how to remember to address them. She may also benefit from continued therapy including CBT to explore unhelpful thoughts about self in context of family dynamics, and mindfulness for coping with elevated emotions.  Plan: Follow up with behavioral health clinician on: 12/17/21 Referral(s): Integrated [employer] (In Clinic)  I discussed the assessment and treatment plan with the patient and/or parent/guardian. They were provided an opportunity to ask questions and all were answered. They agreed with the plan and demonstrated an understanding of the instructions.   They were advised to call back or seek an in-person evaluation if the symptoms worsen or if the condition fails to improve as anticipated.  Abigail Butts, LCSW [facility] [phone]

## 2021-12-16 ENCOUNTER — Ambulatory Visit (INDEPENDENT_AMBULATORY_CARE_PROVIDER_SITE_OTHER): Payer: Medicare Other | Admitting: Clinical

## 2021-12-16 ENCOUNTER — Ambulatory Visit: Payer: Medicare Other | Admitting: Clinical

## 2021-12-16 DIAGNOSIS — F4310 Post-traumatic stress disorder, unspecified: Secondary | ICD-10-CM

## 2021-12-16 DIAGNOSIS — F419 Anxiety disorder, unspecified: Secondary | ICD-10-CM

## 2021-12-16 DIAGNOSIS — F200 Paranoid schizophrenia: Secondary | ICD-10-CM

## 2021-12-16 DIAGNOSIS — F332 Major depressive disorder, recurrent severe without psychotic features: Secondary | ICD-10-CM

## 2021-12-16 NOTE — BH Specialist Note (Signed)
Integrated Behavioral Health via Telemedicine Visit  12/16/2021 Theresa Olson [id number]  Number of Integrated Behavioral Health Clinician visits: Additional Visit  Session Start time: 1300   Session End time: 1330  Total time in minutes: 30   Referring Provider: Thad Ranger, NP Patient/Family location: [address] [place] (daughter's home) [facility] Provider location: [facility] All persons participating in visit: CSW, patient Types of Service: Individual psychotherapy and Telephone visit  I connected with [name] via Telephone and verified that I am speaking with the correct person using two identifiers. Discussed confidentiality: Yes   I discussed the limitations of telemedicine and the availability of in person appointments.  Discussed there is a possibility of technology failure and discussed alternative modes of communication if that failure occurs.  I discussed that engaging in this telemedicine visit, they consent to the provision of behavioral healthcare and the services will be billed under their insurance.  Patient and/or legal guardian expressed understanding and consented to Telemedicine visit: Yes   Presenting Concerns: Patient and/or family reports the following symptoms/concerns: depression and anxiety Duration of problem: approx 30 years; Severity of problem: moderate  Patient and/or Family's Strengths/Protective Factors: Social connections and Concrete supports in place (healthy food, safe environments, etc.)  Goals Addressed: Patient will:  Reduce symptoms of: anxiety and depression   Increase knowledge and/or ability of: coping skills and self-management skills   Demonstrate ability to: Increase healthy adjustment to current life circumstances  Progress towards Goals: Ongoing  Interventions: Interventions utilized:  Supportive Counseling Standardized Assessments completed: Not Needed  Supportive counseling today around  family dynamics. Patient is going to meet her half brother next month for the first time. He is currently incarcerated. Processed emotions and thoughts related to this, and to relationship with her two sisters who are also going on the visit.   Patient and/or Family Response: Patient engaged in session.  Assessment: Patient currently experiencing anxiety, depression, schizophrenia, and PTSD. Patient experiencing challenges in managing daily physical needs while working.   Patient may benefit from education and solutions focused interventions to improve awareness of physical needs and how to remember to address them. She may also benefit from continued therapy including CBT to explore unhelpful thoughts about self in context of family dynamics, and mindfulness for coping with elevated emotions.  Plan: Follow up with behavioral health clinician on: 12/30/21 Referral(s): Integrated [employer] (In Clinic)  I discussed the assessment and treatment plan with the patient and/or parent/guardian. They were provided an opportunity to ask questions and all were answered. They agreed with the plan and demonstrated an understanding of the instructions.   They were advised to call back or seek an in-person evaluation if the symptoms worsen or if the condition fails to improve as anticipated.  Abigail Butts, LCSW [facility] [phone]

## 2021-12-17 ENCOUNTER — Ambulatory Visit: Payer: Medicaid Other | Admitting: Clinical

## 2021-12-30 ENCOUNTER — Ambulatory Visit: Payer: Medicare Other | Admitting: Clinical

## 2021-12-30 DIAGNOSIS — F332 Major depressive disorder, recurrent severe without psychotic features: Secondary | ICD-10-CM

## 2021-12-30 DIAGNOSIS — F4310 Post-traumatic stress disorder, unspecified: Secondary | ICD-10-CM | POA: Diagnosis not present

## 2021-12-30 DIAGNOSIS — F200 Paranoid schizophrenia: Secondary | ICD-10-CM

## 2021-12-30 DIAGNOSIS — F419 Anxiety disorder, unspecified: Secondary | ICD-10-CM

## 2021-12-30 NOTE — BH Specialist Note (Addendum)
Integrated Behavioral Health via Telemedicine Visit  12/30/2021 Theresa Olson [id number]  Number of [place] Clinician visits: Additional Visit  Session Start time: 3159   Session End time: 4585  Total time in minutes: 30   Referring Provider: Dionisio David, NP Patient/Family location: [address] [place] (daughter's home) [facility] Provider location: Patient [place] All persons participating in visit: CSW, patient Types of Service: Individual psychotherapy and Telephone visit  I connected with Theresa Olson via Telephone and verified that I am speaking with the correct person using two identifiers. Discussed confidentiality: Yes   I discussed the limitations of telemedicine and the availability of in person appointments.  Discussed there is a possibility of technology failure and discussed alternative modes of communication if that failure occurs.  I discussed that engaging in this telemedicine visit, they consent to the provision of behavioral healthcare and the services will be billed under their insurance.  Patient and/or legal guardian expressed understanding and consented to Telemedicine visit: Yes   Presenting Concerns: Patient and/or family reports the following symptoms/concerns: depression and anxiety Duration of problem: approx 30 years; Severity of problem: moderate  Patient and/or Family's Strengths/Protective Factors: Social connections and Concrete supports in place (healthy food, safe environments, etc.)  Goals Addressed: Patient will:  Reduce symptoms of: anxiety and depression   Increase knowledge and/or ability of: coping skills and self-management skills   Demonstrate ability to: Increase healthy adjustment to current life circumstances  Progress towards Goals: Ongoing  Interventions: Interventions utilized:  Supportive Counseling Standardized Assessments completed: Not Needed  Supportive counseling as patient  prepares to go meet her brother whom she has not met before. Reviewed treatment plan; goals are ongoing. Patient has made significant progress on goals.   Patient and/or Family Response: Patient engaged in session.  Assessment: Patient currently experiencing anxiety, depression, schizophrenia, and PTSD. Patient experiencing challenges in managing daily physical needs while working.   Patient may benefit from education and solutions focused interventions to improve awareness of physical needs and how to remember to address them. She may also benefit from continued therapy including CBT to explore unhelpful thoughts about self in context of family dynamics, and mindfulness for coping with elevated emotions.  Plan: Follow up with behavioral health clinician on : 01/06/22 Referral(s): [place] (In Clinic)  I discussed the assessment and treatment plan with the patient and/or parent/guardian. They were provided an opportunity to ask questions and all were answered. They agreed with the plan and demonstrated an understanding of the instructions.   They were advised to call back or seek an in-person evaluation if the symptoms worsen or if the condition fails to improve as anticipated.  Estanislado Emms, [facility] [phone]

## 2022-01-06 ENCOUNTER — Ambulatory Visit (INDEPENDENT_AMBULATORY_CARE_PROVIDER_SITE_OTHER): Payer: Medicare Other | Admitting: Clinical

## 2022-01-06 DIAGNOSIS — F4310 Post-traumatic stress disorder, unspecified: Secondary | ICD-10-CM

## 2022-01-06 DIAGNOSIS — F419 Anxiety disorder, unspecified: Secondary | ICD-10-CM

## 2022-01-06 DIAGNOSIS — F332 Major depressive disorder, recurrent severe without psychotic features: Secondary | ICD-10-CM

## 2022-01-06 DIAGNOSIS — F200 Paranoid schizophrenia: Secondary | ICD-10-CM

## 2022-01-06 NOTE — BH Specialist Note (Addendum)
Integrated Behavioral Health Follow Up In-Person Visit  MRN: [id number] Name: Theresa Olson  Number of [place] Clinician visits: Additional Visit  Session Start time: 6237   Session End time: 1340  Total time in minutes: 35   Types of Service: Individual psychotherapy  Interpretor:No. Interpretor Name and Language: none  Subjective: Theresa Olson is a 51 y.o. female accompanied by  self Patient was referred by Dionisio David, NP for depression and anxiety. Patient reports the following symptoms/concerns: depression and anxiety Duration of problem: approx 30 years; Severity of problem: moderate  Objective: Mood: Euthymic and Affect: Appropriate Risk of harm to self or others: No plan to harm self or others  Patient and/or Family's Strengths/Protective Factors: Social connections and Concrete supports in place (healthy food, safe environments, etc.)  Goals Addressed: Patient will:  Reduce symptoms of: anxiety and depression   Increase knowledge and/or ability of: coping skills and self-management skills   Demonstrate ability to: Increase healthy adjustment to current life circumstances  Progress towards Goals: Ongoing  Interventions: Interventions utilized:  Supportive Counseling Standardized Assessments completed: Not Needed  Supportive counseling and CBT today around patient's upcoming plans to meet her half brother. She has never met him but is hopeful for a positive adult relationship. Processed patient's emotions and thoughts, particularly in context of difficult dynamics with her sisters. Emotional validation and reflective listening provided.   Patient and/or Family Response: Patient engaged in session.  Assessment: Patient currently experiencing anxiety, depression, schizophrenia, and PTSD. Patient experiencing challenges in managing daily physical needs while working.   Patient may also benefit from continued therapy including CBT to  explore unhelpful thoughts about self in context of family dynamics, and mindfulness for coping with elevated emotions.  Plan: Follow up with behavioral health clinician on: 01/20/22 Referral(s): [place] (In Clinic)  Estanislado Emms, [facility] [phone]

## 2022-01-20 ENCOUNTER — Ambulatory Visit (INDEPENDENT_AMBULATORY_CARE_PROVIDER_SITE_OTHER): Payer: Medicare Other | Admitting: Clinical

## 2022-01-20 DIAGNOSIS — F4310 Post-traumatic stress disorder, unspecified: Secondary | ICD-10-CM

## 2022-01-20 DIAGNOSIS — F200 Paranoid schizophrenia: Secondary | ICD-10-CM

## 2022-01-20 DIAGNOSIS — F419 Anxiety disorder, unspecified: Secondary | ICD-10-CM

## 2022-01-20 DIAGNOSIS — F332 Major depressive disorder, recurrent severe without psychotic features: Secondary | ICD-10-CM

## 2022-01-20 NOTE — BH Specialist Note (Addendum)
Integrated Behavioral Health via Telemedicine Visit  01/20/2022 Venera Privott [id number]  Number of Integrated Behavioral Health Clinician visits: Additional Visit  Session Start time: 1402   Session End time: 1430  Total time in minutes: 28   Referring Provider: Thad Ranger, NP Patient/Family location: [address] [place] (daughter's home) [facility] Provider location: [facility] All persons participating in visit: CSW, patient Types of Service: Individual psychotherapy and Telephone visit  I connected with [name] via Telephone and verified that I am speaking with the correct person using two identifiers. Discussed confidentiality: Yes   I discussed the limitations of telemedicine and the availability of in person appointments.  Discussed there is a possibility of technology failure and discussed alternative modes of communication if that failure occurs.  I discussed that engaging in this telemedicine visit, they consent to the provision of behavioral healthcare and the services will be billed under their insurance.  Patient and/or legal guardian expressed understanding and consented to Telemedicine visit: Yes   Presenting Concerns: Patient and/or family reports the following symptoms/concerns: depression and anxiety Duration of problem: approx 30 years; Severity of problem: moderate  PSocial connections and Concrete supports in place (healthy food, safe environments, etc.)  Goals Addressed: Reduce symptoms of: anxiety and depression  Increase knowledge and/or ability of: coping skills and self-management skills  Demonstrate ability to: Increase healthy adjustment to current life circumstances  Progress towards Goals: Ongoing  Interventions: Interventions utilized:  Supportive Counseling Standardized Assessments completed: Not Needed  Supportive counseling today as patient prepares to meet her half brother for the first time this weekend.  Processed emotions related to this and to dynamics with her sisters, whom she'll be going with. Patient also passed her drivers test and can now get her license. Celebrated accomplishment with patient and reinforced patient strengths and positive qualities.  Patient and/or Family Response: Patient engaged in session.   Patient currently experiencing anxiety, depression, schizophrenia, and PTSD. Patient experiencing challenges in managing daily physical needs while working.   Patient may benefit from education and solutions focused interventions to improve awareness of physical needs and how to remember to address them. She may also benefit from continued therapy including CBT to explore unhelpful thoughts about self in context of family dynamics, and mindfulness for coping with elevated emotions.  Plan: Follow up with behavioral health clinician on: 01/24/22  I discussed the assessment and treatment plan with the patient and/or parent/guardian. They were provided an opportunity to ask questions and all were answered. They agreed with the plan and demonstrated an understanding of the instructions.   They were advised to call back or seek an in-person evaluation if the symptoms worsen or if the condition fails to improve as anticipated.  Abigail Butts, LCSW [facility] [phone]

## 2022-02-03 ENCOUNTER — Ambulatory Visit: Payer: Medicare Other | Admitting: Clinical

## 2022-02-03 DIAGNOSIS — F4310 Post-traumatic stress disorder, unspecified: Secondary | ICD-10-CM

## 2022-02-03 DIAGNOSIS — F332 Major depressive disorder, recurrent severe without psychotic features: Secondary | ICD-10-CM

## 2022-02-03 DIAGNOSIS — F419 Anxiety disorder, unspecified: Secondary | ICD-10-CM | POA: Diagnosis not present

## 2022-02-03 DIAGNOSIS — F200 Paranoid schizophrenia: Secondary | ICD-10-CM | POA: Diagnosis not present

## 2022-02-03 NOTE — BH Specialist Note (Addendum)
Integrated Behavioral Health via Telemedicine Visit  02/03/2022 Theresa Olson [id number]  Number of Integrated Behavioral Health Clinician visits: Additional Visit  Session Start time: 1138   Session End time: 1202  Total time in minutes: 24   Referring Provider: Thad Ranger, NP Patient/Family location: [address] [place] (daughter's home) [facility] Provider location: [facility] All persons participating in visit: CSW, patient Types of Service: Individual psychotherapy and Telephone visit  I connected with Theresa Olson via Telephone and verified that I am speaking with the correct person using two identifiers. Discussed confidentiality: Yes   I discussed the limitations of telemedicine and the availability of in person appointments.  Discussed there is a possibility of technology failure and discussed alternative modes of communication if that failure occurs.  I discussed that engaging in this telemedicine visit, they consent to the provision of behavioral healthcare and the services will be billed under their insurance.  Patient and/or legal guardian expressed understanding and consented to Telemedicine visit: Yes   Presenting Concerns: Patient and/or family reports the following symptoms/concerns: depression and anxiety Duration of problem: approx 30 years; Severity of problem: moderate  Patient and/or Family's Strengths/Protective Factors: Social connections and Concrete supports in place (healthy food, safe environments, etc.)  Goals Addressed: Patient will:  Reduce symptoms of: anxiety and depression   Increase knowledge and/or ability of: coping skills and self-management skills   Demonstrate ability to: Increase healthy adjustment to current life circumstances  Progress towards Goals: Ongoing  Interventions: Interventions utilized:  Supportive Counseling Standardized Assessments completed: Not Needed  Supportive counseling today. Patient  did not get to meet her half-brother last weekend as planned. Reflected on emotions about this. Also prociessed patient's thoughts and emotions about relationship with her younger daughter who is in college.  Patient and/or Family Response: Patient engaged in session.   Assessment: Patient currently experiencing anxiety, depression, schizophrenia, and PTSD. Patient experiencing challenges in managing daily physical needs while working.   Patient may also benefit from continued therapy including CBT to explore unhelpful thoughts about self in context of family dynamics, and mindfulness for coping with elevated emotions.  Plan: Follow up with behavioral health clinician on: 02/17/22  I discussed the assessment and treatment plan with the patient and/or parent/guardian. They were provided an opportunity to ask questions and all were answered. They agreed with the plan and demonstrated an understanding of the instructions.   They were advised to call back or seek an in-person evaluation if the symptoms worsen or if the condition fails to improve as anticipated.  Abigail Butts, LCSW [facility] [phone]

## 2022-02-17 ENCOUNTER — Ambulatory Visit (INDEPENDENT_AMBULATORY_CARE_PROVIDER_SITE_OTHER): Payer: Medicare Other | Admitting: Clinical

## 2022-02-17 DIAGNOSIS — F200 Paranoid schizophrenia: Secondary | ICD-10-CM

## 2022-02-17 DIAGNOSIS — F4310 Post-traumatic stress disorder, unspecified: Secondary | ICD-10-CM

## 2022-02-17 DIAGNOSIS — F332 Major depressive disorder, recurrent severe without psychotic features: Secondary | ICD-10-CM

## 2022-02-17 DIAGNOSIS — F419 Anxiety disorder, unspecified: Secondary | ICD-10-CM

## 2022-02-17 NOTE — BH Specialist Note (Addendum)
Integrated Behavioral Health via Telemedicine Visit  02/17/2022 Theresa Olson [id number]  Number of [place] Clinician visits: Additional Visit  Session Start time: 1305   Session End time: 4967  Total time in minutes: 36   Referring Provider: Dionisio David, NP Patient/Family location: [address], [place] (daughter's home) [facility] Provider location: [facility] [place] All persons participating in visit: CSW, patient Types of Service: Individual psychotherapy and Telephone visit  I connected with Theresa Olson via Telephone and verified that I am speaking with the correct person using two identifiers. Discussed confidentiality: Yes   I discussed the limitations of telemedicine and the availability of in person appointments.  Discussed there is a possibility of technology failure and discussed alternative modes of communication if that failure occurs.  I discussed that engaging in this telemedicine visit, they consent to the provision of behavioral healthcare and the services will be billed under their insurance.  Patient and/or legal guardian expressed understanding and consented to Telemedicine visit: Yes   Presenting Concerns: Patient and/or family reports the following symptoms/concerns: depression and anxiety Duration of problem: approx 30 years; Severity of problem: moderate  Patient and/or Family's Strengths/Protective Factors: Social connections and Concrete supports in place (healthy food, safe environments, etc.)  Goals Addressed: Patient will: Reduce symptoms of: anxiety and depression   Increase knowledge and/or ability of: coping skills and self-management skills   Demonstrate ability to: Increase healthy adjustment to current life circumstances  Progress towards Goals: Ongoing  Interventions: Interventions utilized:  Supportive Counseling Standardized Assessments completed: Not Needed  Supportive counseling to process  patient's emotions related to not being able to meet her half-brother as planned a few weeks ago. Reframed some negative thoughts about the situation and discussed empowerment for patient to feel autonomy in the situation.   Patient and/or Family Response: Patient engaged in session.   Assessment: Patient currently experiencing anxiety, depression, schizophrenia, and PTSD. Patient experiencing challenges in managing daily physical needs while working.   Patient may also benefit from continued therapy including CBT to explore unhelpful thoughts about self in context of family dynamics, and mindfulness for coping with elevated emotions.  Plan: Follow up with behavioral health clinician on: 03/03/22  I discussed the assessment and treatment plan with the patient and/or parent/guardian. They were provided an opportunity to ask questions and all were answered. They agreed with the plan and demonstrated an understanding of the instructions.   They were advised to call back or seek an in-person evaluation if the symptoms worsen or if the condition fails to improve as anticipated.  Estanislado Emms, [facility] [phone]

## 2022-02-22 ENCOUNTER — Other Ambulatory Visit (HOSPITAL_COMMUNITY): Payer: Self-pay | Admitting: Psychiatry

## 2022-02-22 DIAGNOSIS — F200 Paranoid schizophrenia: Secondary | ICD-10-CM

## 2022-02-22 DIAGNOSIS — F4310 Post-traumatic stress disorder, unspecified: Secondary | ICD-10-CM

## 2022-03-02 ENCOUNTER — Other Ambulatory Visit (HOSPITAL_COMMUNITY): Payer: Self-pay | Admitting: Psychiatry

## 2022-03-02 DIAGNOSIS — F4310 Post-traumatic stress disorder, unspecified: Secondary | ICD-10-CM

## 2022-03-02 DIAGNOSIS — F200 Paranoid schizophrenia: Secondary | ICD-10-CM

## 2022-03-03 ENCOUNTER — Ambulatory Visit: Payer: Medicare Other | Admitting: Clinical

## 2022-03-03 DIAGNOSIS — F419 Anxiety disorder, unspecified: Secondary | ICD-10-CM

## 2022-03-03 DIAGNOSIS — F200 Paranoid schizophrenia: Secondary | ICD-10-CM

## 2022-03-03 DIAGNOSIS — F4310 Post-traumatic stress disorder, unspecified: Secondary | ICD-10-CM

## 2022-03-03 DIAGNOSIS — F332 Major depressive disorder, recurrent severe without psychotic features: Secondary | ICD-10-CM

## 2022-03-03 NOTE — BH Specialist Note (Addendum)
Integrated Behavioral Health via Telemedicine Visit  03/03/2022 Nola Botkins [id number]  Number of [place] Clinician visits: Additional Visit  Session Start time: 1300   Session End time: 6384  Total time in minutes: 22   Referring Provider: Dionisio David, NP Patient/Family location: [address] [place] (daughter's home) [facility] Provider location: [facility] [place] All persons participating in visit: CSW, patient Types of Service: Individual psychotherapy and Telephone visit  I connected with Anette Riedel via Telephone and verified that I am speaking with the correct person using two identifiers. Discussed confidentiality: Yes   I discussed the limitations of telemedicine and the availability of in person appointments.  Discussed there is a possibility of technology failure and discussed alternative modes of communication if that failure occurs.  I discussed that engaging in this telemedicine visit, they consent to the provision of behavioral healthcare and the services will be billed under their insurance.  Patient and/or legal guardian expressed understanding and consented to Telemedicine visit: Yes   Presenting Concerns: Patient and/or family reports the following symptoms/concerns: depression and anxiety Duration of problem: approx 30 years; Severity of problem: moderate  Patient and/or Family's Strengths/Protective Factors: Social connections and Concrete supports in place (healthy food, safe environments, etc.)  Goals Addressed: Patient will: Reduce symptoms of: anxiety and depression   Increase knowledge and/or ability of: coping skills and self-management skills   Demonstrate ability to: Increase healthy adjustment to current life circumstances  Progress towards Goals: Ongoing  Interventions: Interventions utilized:  Supportive Counseling Standardized Assessments completed: Not Needed  Supportive counseling today around  concerns about her daughter who is in college at [employer]. Explored worries and challenged the related beliefs.   Patient and/or Family Response: Patient engaged in session.   Assessment: Patient currently experiencing anxiety, depression, schizophrenia, and PTSD. Patient experiencing challenges in managing daily physical needs while working.   Patient may also benefit from continued therapy including CBT to explore unhelpful thoughts about self in context of family dynamics, and mindfulness for coping with elevated emotions.    Plan: Follow up with behavioral health clinician on: will call patient next week  I discussed the assessment and treatment plan with the patient and/or parent/guardian. They were provided an opportunity to ask questions and all were answered. They agreed with the plan and demonstrated an understanding of the instructions.   They were advised to call back or seek an in-person evaluation if the symptoms worsen or if the condition fails to improve as anticipated.  Estanislado Emms, LCSW

## 2022-03-10 ENCOUNTER — Encounter (HOSPITAL_COMMUNITY): Payer: Self-pay | Admitting: Psychiatry

## 2022-03-10 ENCOUNTER — Telehealth (HOSPITAL_COMMUNITY): Payer: Medicaid Other | Admitting: Psychiatry

## 2022-03-10 ENCOUNTER — Telehealth (HOSPITAL_BASED_OUTPATIENT_CLINIC_OR_DEPARTMENT_OTHER): Payer: Medicaid Other | Admitting: Psychiatry

## 2022-03-10 DIAGNOSIS — F419 Anxiety disorder, unspecified: Secondary | ICD-10-CM

## 2022-03-10 DIAGNOSIS — F4310 Post-traumatic stress disorder, unspecified: Secondary | ICD-10-CM | POA: Diagnosis not present

## 2022-03-10 DIAGNOSIS — F200 Paranoid schizophrenia: Secondary | ICD-10-CM | POA: Diagnosis not present

## 2022-03-10 MED ORDER — DULOXETINE HCL 60 MG PO CPEP: ORAL_CAPSULE | ORAL | 2 refills | Status: DC

## 2022-03-10 MED ORDER — HYDROXYZINE HCL 50 MG PO TABS: 50.0000 mg | ORAL_TABLET | Freq: Two times a day (BID) | ORAL | 2 refills | Status: DC | PRN

## 2022-03-10 MED ORDER — ARIPIPRAZOLE 10 MG PO TABS: 10.0000 mg | ORAL_TABLET | Freq: Every day | ORAL | 2 refills | Status: DC
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
## 2022-04-05 ENCOUNTER — Ambulatory Visit (INDEPENDENT_AMBULATORY_CARE_PROVIDER_SITE_OTHER): Payer: Medicare Other | Admitting: Clinical

## 2022-04-05 DIAGNOSIS — F200 Paranoid schizophrenia: Secondary | ICD-10-CM

## 2022-04-05 DIAGNOSIS — F332 Major depressive disorder, recurrent severe without psychotic features: Secondary | ICD-10-CM

## 2022-04-05 DIAGNOSIS — F4310 Post-traumatic stress disorder, unspecified: Secondary | ICD-10-CM | POA: Diagnosis not present

## 2022-04-05 DIAGNOSIS — F419 Anxiety disorder, unspecified: Secondary | ICD-10-CM

## 2022-04-05 NOTE — BH Specialist Note (Addendum)
Integrated Behavioral Health via Telemedicine Visit  04/05/2022 Theresa Olson [id number]  Number of [place] Clinician visits: Additional Visit  Session Start time: 1110   Session End time: 7106  Total time in minutes: 37   Referring Provider: Dionisio David, NP Patient/Family location: [address], [place] (daughter's home) [facility] Provider location: [facility] [place] All persons participating in visit: CSW, patient Types of Service: Individual psychotherapy and Telephone visit  I connected with Theresa Olson via Telephone and verified that I am speaking with the correct person using two identifiers. Discussed confidentiality: Yes   I discussed the limitations of telemedicine and the availability of in person appointments.  Discussed there is a possibility of technology failure and discussed alternative modes of communication if that failure occurs.  I discussed that engaging in this telemedicine visit, they consent to the provision of behavioral healthcare and the services will be billed under their insurance.  Patient and/or legal guardian expressed understanding and consented to Telemedicine visit: Yes   Presenting Concerns: Patient and/or family reports the following symptoms/concerns: depression and anxiety Duration of problem: approx 30 years; Severity of problem: moderate  Patient and/or Family's Strengths/Protective Factors: Social connections and Concrete supports in place (healthy food, safe environments, etc.)   Goals Addressed: Patient will: Reduce symptoms of: anxiety and depression   Increase knowledge and/or ability of: coping skills and self-management skills   Demonstrate ability to: Increase healthy adjustment to current life circumstances  Progress towards Goals: Ongoing  Interventions: Interventions utilized:  Supportive Counseling Standardized Assessments completed: Not Needed  Supporitve counseling today. Patient  was recently approved for social security disability but has not yet started receiving a check. She is looking forward to finding her own apartment and moving out of her daughter's place. Discussed money management and budgeting. Patient reports difficulties with this in the past. CSW to assist with resources for this. Called and left message with patient's [facility] caseworker to inquire about assistance with this. Patient recently had a dentist appointment and has an eye doctor appointment later today.   Patient and/or Family Response: Patient engaged in session.   Assessment: Patient currently experiencing anxiety, depression, schizophrenia, and PTSD. Patient experiencing challenges in managing daily physical needs while working.   Patient may also benefit from continued therapy including CBT to explore unhelpful thoughts about self in context of family dynamics, and mindfulness for coping with elevated emotions.  Plan: Follow up with behavioral health clinician on: 04/19/22  I discussed the assessment and treatment plan with the patient and/or parent/guardian. They were provided an opportunity to ask questions and all were answered. They agreed with the plan and demonstrated an understanding of the instructions.   They were advised to call back or seek an in-person evaluation if the symptoms worsen or if the condition fails to improve as anticipated.  Estanislado Emms, LCSW

## 2022-04-14 ENCOUNTER — Other Ambulatory Visit (HOSPITAL_COMMUNITY): Payer: Self-pay | Admitting: Psychiatry

## 2022-04-14 DIAGNOSIS — F4310 Post-traumatic stress disorder, unspecified: Secondary | ICD-10-CM

## 2022-04-14 DIAGNOSIS — F419 Anxiety disorder, unspecified: Secondary | ICD-10-CM

## 2022-04-19 ENCOUNTER — Ambulatory Visit (INDEPENDENT_AMBULATORY_CARE_PROVIDER_SITE_OTHER): Payer: Medicare Other | Admitting: Clinical

## 2022-04-19 DIAGNOSIS — F4310 Post-traumatic stress disorder, unspecified: Secondary | ICD-10-CM | POA: Diagnosis not present

## 2022-04-19 DIAGNOSIS — F332 Major depressive disorder, recurrent severe without psychotic features: Secondary | ICD-10-CM

## 2022-04-19 DIAGNOSIS — F419 Anxiety disorder, unspecified: Secondary | ICD-10-CM

## 2022-04-19 DIAGNOSIS — F200 Paranoid schizophrenia: Secondary | ICD-10-CM

## 2022-04-19 NOTE — BH Specialist Note (Addendum)
Integrated Behavioral Health via Telemedicine Visit  04/19/2022 Theresa Olson [id number]  Number of Integrated Behavioral Health Clinician visits: Additional Visit  Session Start time: 1105   Session End time: 1130  Total time in minutes: 25   Referring Provider: Thad Ranger, NP Patient/Family location: [address], [place] (daughter's home) [facility] Provider location: [facility] All persons participating in visit: CSW, patient Types of Service: Individual psychotherapy and Telephone visit  I connected with Theresa Olson via Telephone and verified that I am speaking with the correct person using two identifiers. Discussed confidentiality: Yes   I discussed the limitations of telemedicine and the availability of in person appointments.  Discussed there is a possibility of technology failure and discussed alternative modes of communication if that failure occurs.  I discussed that engaging in this telemedicine visit, they consent to the provision of behavioral healthcare and the services will be billed under their insurance.  Patient and/or legal guardian expressed understanding and consented to Telemedicine visit: Yes   Presenting Concerns: Patient and/or family reports the following symptoms/concerns: depression and anxiety Duration of problem: approx 30 years; Severity of problem: moderate  Patient and/or Family's Strengths/Protective Factors: Social connections and Concrete supports in place (healthy food, safe environments, etc.)    Goals Addressed: Patient will:  Reduce symptoms of: anxiety and depression   Increase knowledge and/or ability of: coping skills and self-management skills   Demonstrate ability to: Increase healthy adjustment to current life circumstances  Progress towards Goals: Ongoing  Interventions: Interventions utilized:  Supportive Counseling Standardized Assessments completed: Not Needed  Supportive counseling today around  adjustment as patient has been approved for social security disability. She'll start to receive payments in December. She would like to move out of her daughter's home. Discussed looking for apartments.  Patient and/or Family Response: Patient engaged in session.   Assessment: Patient currently experiencing anxiety, depression, schizophrenia, and PTSD. Patient experiencing challenges in managing daily physical needs while working.   Patient may also benefit from continued therapy including CBT to explore unhelpful thoughts about self in context of family dynamics, and mindfulness for coping with elevated emotions.  Plan: Follow up with behavioral health clinician on: 04/29/22  I discussed the assessment and treatment plan with the patient and/or parent/guardian. They were provided an opportunity to ask questions and all were answered. They agreed with the plan and demonstrated an understanding of the instructions.   They were advised to call back or seek an in-person evaluation if the symptoms worsen or if the condition fails to improve as anticipated.  Abigail Butts, LCSW

## 2022-04-29 ENCOUNTER — Encounter: Payer: Medicaid Other | Admitting: Clinical

## 2022-04-29 ENCOUNTER — Telehealth: Payer: Self-pay | Admitting: Clinical

## 2022-04-29 NOTE — Telephone Encounter (Addendum)
Integrated Behavioral Health General Follow Up Note  04/29/2022 Name: Mabelle Mungin MRN: [id number] DOB: [birth year] Jasmaine Rochel is a 51 y.o. year old female who sees Quentin Angst, MD for primary care. LCSW follows patient for Integrated Behavioral Health (IBH) counseling.  Interpreter: No.   Interpreter Name & Language: none  Assessment: Patient experiencing anxiety, depression, PTSD, and schizophrenia.  Ongoing Intervention: Spoke to patient briefly on the phone today. She missed original calls for scheduled Integrated Behavioral Health (IBH) televisit, so just had brief phone check in. Discussed budgeting and money management support, possibly through [place].  Review of patient status, including review of consultants reports, relevant laboratory and other test results, and collaboration with appropriate care team members and the patient's provider was performed as part of comprehensive patient evaluation and provision of services.    Abigail Butts, LCSW [facility] [phone]

## 2022-05-13 ENCOUNTER — Ambulatory Visit (INDEPENDENT_AMBULATORY_CARE_PROVIDER_SITE_OTHER): Payer: Medicare Other | Admitting: Clinical

## 2022-05-13 DIAGNOSIS — F4310 Post-traumatic stress disorder, unspecified: Secondary | ICD-10-CM

## 2022-05-13 DIAGNOSIS — F200 Paranoid schizophrenia: Secondary | ICD-10-CM

## 2022-05-13 DIAGNOSIS — F419 Anxiety disorder, unspecified: Secondary | ICD-10-CM

## 2022-05-13 DIAGNOSIS — F332 Major depressive disorder, recurrent severe without psychotic features: Secondary | ICD-10-CM

## 2022-05-13 NOTE — BH Specialist Note (Addendum)
Integrated Behavioral Health via Telemedicine Visit  05/13/2022 Theresa Olson [id number]  Number of Integrated Behavioral Health Clinician visits: Additional Visit  Session Start time: 1100   Session End time: 1130  Total time in minutes: 30   Referring Provider: Thad Ranger, NP Patient/Family location: [address], [place] (daughter's home) [facility] Provider location: [facility] All persons participating in visit: CSW, patient Types of Service: Individual psychotherapy and Telephone visit  I connected with Theresa Olson via Telephone and verified that I am speaking with the correct person using two identifiers. Discussed confidentiality: Yes   I discussed the limitations of telemedicine and the availability of in person appointments.  Discussed there is a possibility of technology failure and discussed alternative modes of communication if that failure occurs.  I discussed that engaging in this telemedicine visit, they consent to the provision of behavioral healthcare and the services will be billed under their insurance.  Patient and/or legal guardian expressed understanding and consented to Telemedicine visit: Yes   Presenting Concerns: Patient and/or family reports the following symptoms/concerns: depression and anxiety Duration of problem: approx 30 years; Severity of problem: moderate  Patient and/or Family's Strengths/Protective Factors: Social connections and Concrete supports in place (healthy food, safe environments, etc.)    Goals Addressed: Patient will:  Reduce symptoms of: anxiety and depression   Increase knowledge and/or ability of: coping skills and self-management skills   Demonstrate ability to: Increase healthy adjustment to current life circumstances  Progress towards Goals: Ongoing  Interventions: Interventions utilized:  Supportive Counseling and Link to [employer] Standardized Assessments completed: Not  Needed  Supportive counseling today around patient's living situation and coping with her daughter's dynamics with [daughter's] husband. Patient started receiving social security income this month. She wants to move out of her daughter's home. She needs help with budgeting and money management, as well as finding affordable housing. Mailed patient a list of affordable housing and also called patient's Loss adjuster, chartered, Angelique. Advised caseworker of patient needs; caseworker indicated she will call patient next week and discuss housing and money management resources. CSW to also refer patient to [facility]) for assistance with money management.   Patient and/or Family Response: Patient engaged in session.   Assessment: Patient currently experiencing anxiety, depression, schizophrenia, and PTSD. Patient experiencing challenges in managing daily physical needs while working.   Patient may also benefit from continued therapy including CBT to explore unhelpful thoughts about self in context of family dynamics, and mindfulness for coping with elevated emotions.  Plan: Follow up with behavioral health clinician on: 05/27/22  I discussed the assessment and treatment plan with the patient and/or parent/guardian. They were provided an opportunity to ask questions and all were answered. They agreed with the plan and demonstrated an understanding of the instructions.   They were advised to call back or seek an in-person evaluation if the symptoms worsen or if the condition fails to improve as anticipated.  Abigail Butts, LCSW

## 2022-05-27 ENCOUNTER — Ambulatory Visit (INDEPENDENT_AMBULATORY_CARE_PROVIDER_SITE_OTHER): Payer: Medicare Other | Admitting: Clinical

## 2022-05-27 DIAGNOSIS — F4310 Post-traumatic stress disorder, unspecified: Secondary | ICD-10-CM

## 2022-05-27 DIAGNOSIS — F200 Paranoid schizophrenia: Secondary | ICD-10-CM

## 2022-05-27 DIAGNOSIS — F332 Major depressive disorder, recurrent severe without psychotic features: Secondary | ICD-10-CM

## 2022-05-27 DIAGNOSIS — F419 Anxiety disorder, unspecified: Secondary | ICD-10-CM

## 2022-05-27 NOTE — BH Specialist Note (Addendum)
Integrated Behavioral Health via Telemedicine Visit  05/27/2022 Theresa Olson [id number]  Number of Integrated Behavioral Health Clinician visits: Additional Visit  Session Start time: 1120   Session End time: 1145  Total time in minutes: 25   Referring Provider: Thad Ranger, NP Patient/Family location: [address] [place] (daughter's home) [facility] Provider location: [facility] All persons participating in visit: CSW, patient Types of Service: Individual psychotherapy and Telephone visit  I connected with Theresa Olson via Telephone and verified that I am speaking with the correct person using two identifiers. Discussed confidentiality: Yes   I discussed the limitations of telemedicine and the availability of in person appointments.  Discussed there is a possibility of technology failure and discussed alternative modes of communication if that failure occurs.  I discussed that engaging in this telemedicine visit, they consent to the provision of behavioral healthcare and the services will be billed under their insurance.  Patient and/or legal guardian expressed understanding and consented to Telemedicine visit: Yes   Presenting Concerns: Patient and/or family reports the following symptoms/concerns: depression and anxiety Duration of problem: approx 30 years; Severity of problem: moderate  Patient and/or Family's Strengths/Protective Factors: Social connections and Concrete supports in place (healthy food, safe environments, etc.)    Goals Addressed: Patient will:  Reduce symptoms of: anxiety and depression   Increase knowledge and/or ability of: coping skills and self-management skills   Demonstrate ability to: Increase healthy adjustment to current life circumstances  Progress towards Goals: Ongoing  Interventions: Interventions utilized:  Supportive Counseling Standardized Assessments completed: Not Needed  Brief supportive counseling with  patient today regarding interpersonal difficulties with her younger daughter. Processed feelings about this and discussed adaptive ways to approach conversation with daughter.  Referred patient to her [facility] caseworker after last visit and patient reported she has talked to caseworker about housing. Also had referred patient to [facility] for support with money management and budgeting and patient reported she has been in contact with them as well.  Patient and/or Family Response: Patient engaged in session.   Assessment: Patient currently experiencing anxiety, depression, schizophrenia, and PTSD. Patient experiencing challenges in managing daily physical needs while working.   Patient may also benefit from continued therapy including CBT to explore unhelpful thoughts about self in context of family dynamics, and mindfulness for coping with elevated emotions.  Plan: Follow up with behavioral health clinician on: 06/10/22 Referral(s): [employer]:  Finances and Housing  I discussed the assessment and treatment plan with the patient and/or parent/guardian. They were provided an opportunity to ask questions and all were answered. They agreed with the plan and demonstrated an understanding of the instructions.   They were advised to call back or seek an in-person evaluation if the symptoms worsen or if the condition fails to improve as anticipated.  Theresa Butts, LCSW

## 2022-06-01 ENCOUNTER — Other Ambulatory Visit: Payer: Self-pay | Admitting: Nurse Practitioner

## 2022-06-01 ENCOUNTER — Other Ambulatory Visit (HOSPITAL_COMMUNITY): Payer: Self-pay | Admitting: Psychiatry

## 2022-06-01 DIAGNOSIS — E785 Hyperlipidemia, unspecified: Secondary | ICD-10-CM

## 2022-06-01 DIAGNOSIS — I160 Hypertensive urgency: Secondary | ICD-10-CM

## 2022-06-01 DIAGNOSIS — F419 Anxiety disorder, unspecified: Secondary | ICD-10-CM

## 2022-06-01 DIAGNOSIS — F4310 Post-traumatic stress disorder, unspecified: Secondary | ICD-10-CM

## 2022-06-09 ENCOUNTER — Telehealth (HOSPITAL_BASED_OUTPATIENT_CLINIC_OR_DEPARTMENT_OTHER): Payer: Medicare Other | Admitting: Psychiatry

## 2022-06-09 ENCOUNTER — Encounter (HOSPITAL_COMMUNITY): Payer: Self-pay | Admitting: Psychiatry

## 2022-06-09 DIAGNOSIS — F419 Anxiety disorder, unspecified: Secondary | ICD-10-CM | POA: Diagnosis not present

## 2022-06-09 DIAGNOSIS — F4310 Post-traumatic stress disorder, unspecified: Secondary | ICD-10-CM

## 2022-06-09 DIAGNOSIS — F200 Paranoid schizophrenia: Secondary | ICD-10-CM

## 2022-06-09 MED ORDER — ARIPIPRAZOLE 10 MG PO TABS: 10.0000 mg | ORAL_TABLET | Freq: Every day | ORAL | 2 refills | Status: DC

## 2022-06-09 MED ORDER — HYDROXYZINE HCL 50 MG PO TABS: 50.0000 mg | ORAL_TABLET | Freq: Two times a day (BID) | ORAL | 2 refills | Status: DC | PRN

## 2022-06-09 MED ORDER — DULOXETINE HCL 60 MG PO CPEP: ORAL_CAPSULE | ORAL | 2 refills | Status: DC
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
## 2022-06-09 NOTE — Telephone Encounter (Signed)
Caller & Relationship to patient:  MRN #  [id number]   Call Back Number:   Date of Last Office Visit: 04/14/2022     Date of Next Office Visit: 06/15/2022   Medication(s) to be Refilled: Amlodipine, Atorvastatin  Preferred Pharmacy:   ** Please notify patient to allow 48-72 hours to process** **Let patient know to contact pharmacy at the end of the day to make sure medication is ready. ** **If patient has not been seen in a year or longer, book an appointment **Advise to use MyChart for refill requests OR to contact their pharmacy

## 2022-06-10 ENCOUNTER — Ambulatory Visit (INDEPENDENT_AMBULATORY_CARE_PROVIDER_SITE_OTHER): Payer: Medicare Other | Admitting: Clinical

## 2022-06-10 DIAGNOSIS — F4310 Post-traumatic stress disorder, unspecified: Secondary | ICD-10-CM | POA: Diagnosis not present

## 2022-06-10 DIAGNOSIS — F200 Paranoid schizophrenia: Secondary | ICD-10-CM

## 2022-06-10 DIAGNOSIS — F332 Major depressive disorder, recurrent severe without psychotic features: Secondary | ICD-10-CM

## 2022-06-10 DIAGNOSIS — F419 Anxiety disorder, unspecified: Secondary | ICD-10-CM

## 2022-06-10 NOTE — BH Specialist Note (Signed)
Integrated Behavioral Health via Telemedicine Visit  06/10/2022 [name] [id number]  Number of [place] Clinician visits: Additional Visit  Session Start time: 7673   Session End time: 4193  Total time in minutes: 23   Referring Provider: Dionisio David, NP Patient/Family location: [address] [place] (daughter's home) [facility] Provider location: Patient [place] All persons participating in visit: CSW, patient Types of Service: Individual psychotherapy and Telephone visit Types of Service: Individual psychotherapy and Telephone visit  I connected with Theresa Olson via Telephone and verified that I am speaking with the correct person using two identifiers. Discussed confidentiality: Yes   I discussed the limitations of telemedicine and the availability of in person appointments.  Discussed there is a possibility of technology failure and discussed alternative modes of communication if that failure occurs.  I discussed that engaging in this telemedicine visit, they consent to the provision of behavioral healthcare and the services will be billed under their insurance.  Patient and/or legal guardian expressed understanding and consented to Telemedicine visit: Yes   Presenting Concerns: Patient and/or family reports the following symptoms/concerns: depression and anxiety Duration of problem: approx 30 years; Severity of problem: moderate  Patient and/or Family's Strengths/Protective Factors: Social connections and Concrete supports in place (healthy food, safe environments, etc.)     Goals Addressed: Patient will: Reduce symptoms of: anxiety and depression   Increase knowledge and/or ability of: coping skills and self-management skills   Demonstrate ability to: Increase healthy adjustment to current life circumstances  Progress towards Goals: Ongoing  Interventions: Interventions utilized:  Supportive Counseling and Link to [employer] Standardized Assessments completed: Not Needed  Supportive counseling today. Patient now has disability income and has started looking at apartments, but most are too expensive on her income. Her [facility] caseworker has referred her to resources for housing, and patient awaiting follow up call. Patient feeling excited and hopeful about the process of finding housing. Reflected on positive changes in the last year. Also discussed relationship with patient's sister, and reflected on how patient has been able to divest emotions from hurtful behaviors that her sister does, and to distance herself.   Patient and/or Family Response: Patient engaged in session.   Assessment: Patient currently experiencing anxiety, depression, schizophrenia, and PTSD. Patient experiencing challenges in managing daily physical needs while working.   Patient may also benefit from continued therapy including CBT to explore unhelpful thoughts about self in context of family dynamics, and mindfulness for coping with elevated emotions.  Plan: Follow up with behavioral health clinician on: 06/24/22 Referral(s): Community Resources:  [place]  I discussed the assessment and treatment plan with the patient and/or parent/guardian. They were provided an opportunity to ask questions and all were answered. They agreed with the plan and demonstrated an understanding of the instructions.   They were advised to call back or seek an in-person evaluation if the symptoms worsen or if the condition fails to improve as anticipated.  Estanislado Emms, LCSW

## 2022-06-15 ENCOUNTER — Ambulatory Visit: Payer: Self-pay | Admitting: Nurse Practitioner

## 2022-06-17 ENCOUNTER — Other Ambulatory Visit (HOSPITAL_COMMUNITY)
Admission: RE | Admit: 2022-06-17 | Discharge: 2022-06-17 | Disposition: A | Discharge status: Home / Self Care | Payer: Medicare Other | Source: Ambulatory Visit | Attending: Nurse Practitioner | Admitting: Nurse Practitioner

## 2022-06-17 ENCOUNTER — Ambulatory Visit (INDEPENDENT_AMBULATORY_CARE_PROVIDER_SITE_OTHER): Payer: Medicare Other | Admitting: Nurse Practitioner

## 2022-06-17 ENCOUNTER — Encounter: Payer: Self-pay | Admitting: Nurse Practitioner

## 2022-06-17 VITALS — BP 112/69 | HR 104 | Temp 97.3°F | Wt 181.4 lb

## 2022-06-17 DIAGNOSIS — Z23 Encounter for immunization: Secondary | ICD-10-CM

## 2022-06-17 DIAGNOSIS — Z0000 Encounter for general adult medical examination without abnormal findings: Secondary | ICD-10-CM

## 2022-06-17 DIAGNOSIS — Z1211 Encounter for screening for malignant neoplasm of colon: Secondary | ICD-10-CM

## 2022-06-17 DIAGNOSIS — F332 Major depressive disorder, recurrent severe without psychotic features: Secondary | ICD-10-CM

## 2022-06-17 DIAGNOSIS — Z124 Encounter for screening for malignant neoplasm of cervix: Secondary | ICD-10-CM | POA: Insufficient documentation

## 2022-06-17 DIAGNOSIS — Z1231 Encounter for screening mammogram for malignant neoplasm of breast: Secondary | ICD-10-CM | POA: Diagnosis not present

## 2022-06-17 DIAGNOSIS — Z13228 Encounter for screening for other metabolic disorders: Secondary | ICD-10-CM

## 2022-06-17 DIAGNOSIS — Z1329 Encounter for screening for other suspected endocrine disorder: Secondary | ICD-10-CM

## 2022-06-17 DIAGNOSIS — Z130 Encounter for screening for diseases of the blood and blood-forming organs and certain disorders involving the immune mechanism: Secondary | ICD-10-CM

## 2022-06-17 DIAGNOSIS — I10 Essential (primary) hypertension: Secondary | ICD-10-CM | POA: Insufficient documentation

## 2022-06-17 DIAGNOSIS — Z1321 Encounter for screening for nutritional disorder: Secondary | ICD-10-CM

## 2022-06-17 DIAGNOSIS — E1169 Type 2 diabetes mellitus with other specified complication: Secondary | ICD-10-CM | POA: Insufficient documentation

## 2022-06-17 DIAGNOSIS — E785 Hyperlipidemia, unspecified: Secondary | ICD-10-CM

## 2022-06-17 NOTE — Assessment & Plan Note (Signed)
Annual exam as documented.  Counseling done include healthy lifestyle involving committing to 150 minutes of exercise per week, heart healthy diet, and attaining healthy weight. The importance of adequate sleep also discussed.  Regular use of seat belt and home safety were also discussed . Changes in health habits are decided on by patient with goals and time frames set for achieving them. Immunization and cancer screening  needs are specifically addressed at this visit.   Cervical Pap exam completed today referral sent for mammogram.  Cologuard ordered to screen for colon cancer.  Flu vaccine given in the office today.  Patient encouraged to get shingles vaccine at the pharmacy

## 2022-06-17 NOTE — Progress Notes (Signed)
Complete physical exam  Patient: Theresa Olson   DOB: 05-12-1971   52 y.o. Female  MRN: [id number]  Subjective:    No chief complaint on file.   Theresa Olson is a 52 y.o. female with past medical history of hypertension, hyperlipidemia, vitamin D deficiency, insomnia, anxiety, depression who presents today for a complete physical exam. She reports consuming a general diet. Does crunches everyday but does not have regular exercise regimen.   Anxiety and depression .  She is currently on Abilify 1 tablet daily, Cymbalta 60 mg daily, hydroxyzine 50 mg twice daily as needed anxiety she sess psychiatrist every 3 months, follows up with LCSW for counseling every other week.  She denies SI, HI  Hypertension.  Currently on amlodipine 5 mg daily.  She denies chest pain, dizziness, edema  Cervical Pap exam completed today referral sent for mammogram.  Cologuard ordered to screen for colon cancer.  Flu vaccine given in the office today.  Patient encouraged to get shingles vaccine at the pharmacy   Most recent fall risk assessment:    07/22/2021    3:21 PM  [place] in the past year? 0     Most recent depression screenings:    06/17/2022    1:36 PM 07/22/2021    3:21 PM  PHQ 2/9 Scores  PHQ - 2 Score 6 0  PHQ- 9 Score 17         Patient Care Team: Renee Rival, FNP as PCP - General (Nurse Practitioner)   Outpatient Medications Prior to Visit  Medication Sig   amLODipine (NORVASC) 5 MG tablet TAKE 1 TABLET (5 MG TOTAL) BY MOUTH DAILY.   ARIPiprazole (ABILIFY) 10 MG tablet Take 1 tablet (10 mg total) by mouth daily.   atorvastatin (LIPITOR) 10 MG tablet TAKE 1 TABLET BY MOUTH EVERY DAY   DULoxetine (CYMBALTA) 60 MG capsule Take one capsule daily   hydrOXYzine (ATARAX) 50 MG tablet Take 1 tablet (50 mg total) by mouth 2 (two) times daily as needed for anxiety.   GLUTATHIONE PO Take 1 tablet by mouth daily. (Patient not taking: Reported on 07/22/2021)    Methylsulfonylmethane (MSM) 1000 MG CAPS Take 1,000 mg by mouth daily. (Patient not taking: Reported on 07/22/2021)   vitamin C (ASCORBIC ACID) 500 MG tablet Take 500 mg by mouth every morning. (Patient not taking: Reported on 06/17/2022)   No facility-administered medications prior to visit.    Review of Systems  Constitutional: Negative.  Negative for chills, fever, malaise/fatigue and weight loss.  HENT: Negative.  Negative for ear discharge, ear pain, hearing loss, nosebleeds and tinnitus.   Eyes: Negative.  Negative for photophobia, pain, discharge and redness.  Respiratory: Negative.  Negative for cough, hemoptysis, sputum production and shortness of breath.   Cardiovascular: Negative.  Negative for chest pain, palpitations, orthopnea and claudication.  Gastrointestinal:  Negative for abdominal pain, heartburn, nausea and vomiting.  Genitourinary: Negative.  Negative for dysuria, frequency and urgency.  Musculoskeletal: Negative.  Negative for back pain, myalgias and neck pain.  Skin: Negative.  Negative for itching and rash.  Neurological: Negative.  Negative for dizziness, tingling, tremors and headaches.  Endo/Heme/Allergies: Negative.   Psychiatric/Behavioral:  Positive for depression. Negative for hallucinations, memory loss, substance abuse and suicidal ideas. The patient is nervous/anxious.           Objective:     BP 112/69   Pulse (!) 104   Temp (!) 97.3 F (36.3 C)   Wt  181 lb 6.4 oz (82.3 kg)   LMP 04/18/2017   SpO2 98%   BMI 35.43 kg/m    Physical Exam Exam conducted with a chaperone present.  Constitutional:      General: She is not in acute distress.    Appearance: Normal appearance. She is obese. She is not ill-appearing, toxic-appearing or diaphoretic.  HENT:     Right Ear: Tympanic membrane, ear canal and external ear normal. There is no impacted cerumen.     Left Ear: Tympanic membrane, ear canal and external ear normal. There is no impacted  cerumen.     Nose: Nose normal. No congestion.     Mouth/Throat:     Mouth: Mucous membranes are moist.     Pharynx: Oropharynx is clear. No oropharyngeal exudate or posterior oropharyngeal erythema.  Eyes:     General: No scleral icterus.       Right eye: No discharge.        Left eye: No discharge.     Extraocular Movements: Extraocular movements intact.     Conjunctiva/sclera: Conjunctivae normal.  Neck:     Vascular: No carotid bruit.  Cardiovascular:     Rate and Rhythm: Normal rate and regular rhythm.     Pulses: Normal pulses.     Heart sounds: Normal heart sounds. No murmur heard.    No friction rub. No gallop.  Pulmonary:     Effort: Pulmonary effort is normal. No respiratory distress.     Breath sounds: Normal breath sounds. No stridor. No wheezing, rhonchi or rales.  Chest:     Chest wall: No mass, lacerations, deformity, swelling, tenderness or edema.  Breasts:    Tanner Score is 5.     Breasts are symmetrical.     Right: Normal. No swelling, bleeding, inverted nipple, mass, nipple discharge, skin change or tenderness.     Left: Normal. No swelling, bleeding, inverted nipple, mass, nipple discharge, skin change or tenderness.  Abdominal:     General: There is no distension.     Palpations: Abdomen is soft. There is no mass.     Tenderness: There is no abdominal tenderness. There is no right CVA tenderness, left CVA tenderness, guarding or rebound.     Hernia: No hernia is present. There is no hernia in the left inguinal area or right inguinal area.  Genitourinary:    General: Normal vulva.     Exam position: Lithotomy position.     Pubic Area: No rash or pubic lice.      Tanner stage (genital): 5.     Labia:        Right: No rash, tenderness, lesion or injury.        Left: No rash, tenderness, lesion or injury.      Urethra: No prolapse, urethral pain, urethral swelling or urethral lesion.     Vagina: No signs of injury and foreign body. No vaginal discharge,  erythema, tenderness, bleeding, lesions or prolapsed vaginal walls.     Cervix: Dilated. No cervical motion tenderness, discharge, friability, lesion, erythema, cervical bleeding or eversion.     Uterus: Not deviated, not enlarged, not fixed, not tender and no uterine prolapse.   Musculoskeletal:        General: No swelling, tenderness, deformity or signs of injury.     Cervical back: Normal range of motion and neck supple. No rigidity or tenderness.     Right lower leg: No edema.     Left lower leg: No edema.  Lymphadenopathy:     Cervical: No cervical adenopathy.     Upper Body:     Right upper body: No supraclavicular, axillary or pectoral adenopathy.     Left upper body: No supraclavicular, axillary or pectoral adenopathy.     Lower Body: No right inguinal adenopathy. No left inguinal adenopathy.  Skin:    General: Skin is warm and dry.     Capillary Refill: Capillary refill takes less than 2 seconds.     Coloration: Skin is not jaundiced or pale.     Findings: No bruising, erythema, lesion or rash.  Neurological:     Mental Status: She is alert and oriented to person, place, and time.     Cranial Nerves: No cranial nerve deficit.     Sensory: No sensory deficit.     Motor: No weakness.     Coordination: Coordination normal.     Gait: Gait normal.  Psychiatric:        Behavior: Behavior normal.        Thought Content: Thought content normal.        Judgment: Judgment normal.      No results found for any visits on 06/17/22.     Assessment & Plan:    Routine Health Maintenance and Physical Exam  Immunization History  Administered Date(s) Administered   Influenza,inj,Quad PF,6+ Mos 02/17/2014, 06/10/2015, 03/06/2017, 04/11/2018, 06/17/2022   Tdap 03/01/2016    Health Maintenance  Topic Date Due   Medicare Annual Wellness (AWV)  Never done   COVID-19 Vaccine (1) Never done   Fecal DNA (Cologuard)  Never done   PAP SMEAR-Modifier  06/04/2018   MAMMOGRAM  Never done    Zoster Vaccines- Shingrix (1 of 2) Never done   DTaP/Tdap/Td (2 - Td or Tdap) 03/01/2026   INFLUENZA VACCINE  Completed   Hepatitis C Screening  Completed   HIV Screening  Completed   HPV VACCINES  Aged Out    Discussed health benefits of physical activity, and encouraged her to engage in regular exercise appropriate for her age and condition.  Problem List Items Addressed This Visit       Cardiovascular and Mediastinum   High blood pressure    BP Readings from Last 3 Encounters:  06/17/22 112/69  07/22/21 121/85  10/01/20 123/76  Chronic medical condition well-controlled on amlodipine 5 mg daily Continue current medication DASH diet advised engage in regular moderate to vigorous exercise with at least 150 minutes weekly CMP today      Relevant Orders   CMP14+EGFR     Other   Recurrent major depression-severe (Ruth)    She is currently on Abilify 1 tablet daily, Cymbalta 60 mg daily, hydroxyzine 50 mg twice daily as needed anxiety she sess psychiatrist every 3 months, follows up with LCSW for counseling every other week.  She denies SI, HI Continue current medications Patient encouraged to maintain close follow-up with psychiatrist and the counselor      Annual physical exam    Annual exam as documented.  Counseling done include healthy lifestyle involving committing to 150 minutes of exercise per week, heart healthy diet, and attaining healthy weight. The importance of adequate sleep also discussed.  Regular use of seat belt and home safety were also discussed . Changes in health habits are decided on by patient with goals and time frames set for achieving them. Immunization and cancer screening  needs are specifically addressed at this visit.   Cervical Pap exam completed today referral sent  for mammogram.  Cologuard ordered to screen for colon cancer.  Flu vaccine given in the office today.  Patient encouraged to get shingles vaccine at the pharmacy       Need for  influenza vaccination   Relevant Orders   Flu Vaccine QUAD 6mo+IM (Fluarix, Fluzone & Alfiuria Quad PF) (Completed)   Hyperlipidemia    Currently on atorvastatin 10 mg daily Check fasting lipid      Relevant Orders   Lipid Panel   Other Visit Diagnoses     Screening for colon cancer    -  Primary   Relevant Orders   Cologuard   Encounter for screening mammogram for malignant neoplasm of breast       Relevant Orders   MM Digital Screening   Screening for thyroid disorder       Relevant Orders   TSH   Screening for endocrine, nutritional, metabolic and immunity disorder       Relevant Orders   CBC with Differential   CMP14+EGFR   TSH   Vitamin D, 25-hydroxy   Hemoglobin A1c   Screening for cervical cancer       Relevant Orders   Cytology - PAP([facility])      Return in about 6 months (around 12/16/2022) for HTN/HLD.     Donell Beers, FNP

## 2022-06-17 NOTE — Patient Instructions (Signed)
Please get your shingles vaccine at the pharmacy.   It is important that you exercise regularly at least 30 minutes 5 times a week as tolerated  Think about what you will eat, plan ahead. Choose " clean, green, fresh or frozen" over canned, processed or packaged foods which are more sugary, salty and fatty. 70 to 75% of food eaten should be vegetables and fruit. Three meals at set times with snacks allowed between meals, but they must be fruit or vegetables. Aim to eat over a 12 hour period , example 7 am to 7 pm, and STOP after  your last meal of the day. Drink water,generally about 64 ounces per day, no other drink is as healthy. Fruit juice is best enjoyed in a healthy way, by EATING the fruit.  Thanks for choosing [facility] we consider it a privelige to serve you.  

## 2022-06-17 NOTE — Assessment & Plan Note (Signed)
Currently on atorvastatin 10 mg daily Check fasting lipid

## 2022-06-17 NOTE — Assessment & Plan Note (Signed)
BP Readings from Last 3 Encounters:  06/17/22 112/69  07/22/21 121/85  10/01/20 123/76  Chronic medical condition well-controlled on amlodipine 5 mg daily Continue current medication DASH diet advised engage in regular moderate to vigorous exercise with at least 150 minutes weekly CMP today

## 2022-06-17 NOTE — Assessment & Plan Note (Signed)
She is currently on Abilify 1 tablet daily, Cymbalta 60 mg daily, hydroxyzine 50 mg twice daily as needed anxiety she sess psychiatrist every 3 months, follows up with LCSW for counseling every other week.  She denies SI, HI Continue current medications Patient encouraged to maintain close follow-up with psychiatrist and the counselor

## 2022-06-22 ENCOUNTER — Telehealth: Payer: Self-pay | Admitting: Clinical

## 2022-06-22 NOTE — Telephone Encounter (Signed)
Patient called and needs some guidance on how to do the Cologuard sample. She received the kit in the mail. Can you please call to review with patient?

## 2022-06-23 ENCOUNTER — Other Ambulatory Visit: Payer: Self-pay

## 2022-06-23 NOTE — Telephone Encounter (Signed)
Pt was advised [facility] 

## 2022-06-24 ENCOUNTER — Ambulatory Visit (INDEPENDENT_AMBULATORY_CARE_PROVIDER_SITE_OTHER): Payer: Medicare Other | Admitting: Clinical

## 2022-06-24 ENCOUNTER — Other Ambulatory Visit: Payer: Self-pay | Admitting: Nurse Practitioner

## 2022-06-24 ENCOUNTER — Telehealth: Payer: Self-pay | Admitting: Clinical

## 2022-06-24 DIAGNOSIS — F419 Anxiety disorder, unspecified: Secondary | ICD-10-CM

## 2022-06-24 DIAGNOSIS — E785 Hyperlipidemia, unspecified: Secondary | ICD-10-CM

## 2022-06-24 DIAGNOSIS — I160 Hypertensive urgency: Secondary | ICD-10-CM

## 2022-06-24 DIAGNOSIS — F200 Paranoid schizophrenia: Secondary | ICD-10-CM

## 2022-06-24 DIAGNOSIS — F4310 Post-traumatic stress disorder, unspecified: Secondary | ICD-10-CM

## 2022-06-24 DIAGNOSIS — F332 Major depressive disorder, recurrent severe without psychotic features: Secondary | ICD-10-CM

## 2022-06-24 MED ORDER — AMLODIPINE BESYLATE 5 MG PO TABS: 5.0000 mg | ORAL_TABLET | Freq: Every day | ORAL | 1 refills | Status: DC

## 2022-06-24 MED ORDER — ATORVASTATIN CALCIUM 10 MG PO TABS: 10.0000 mg | ORAL_TABLET | Freq: Every day | ORAL | 1 refills | Status: DC

## 2022-06-24 NOTE — Progress Notes (Signed)
Integrated Behavioral Health Progress Note  06/24/2022 Name: Theresa Olson MRN: [id number] DOB: 06-07-1970 Theresa Olson is a 52 y.o. year old female who sees [facility]ased[facility], Dewaine Conger, FNP for primary care. LCSW was initially consulted to assist patient with mental health concerns.  Interpreter: No.   Interpreter Name & Language: none  Assessment: Patient experiencing mental health concerns. She is also in need of community resources. She was recently approved for social security disability.  Ongoing Intervention: Spoke to patient on the phone today. Patient doing well, exhibits much improved ability to make and hold boundaries with others. Patient was recently approved for disability and is starting to look for her own apartment. She is in touch with the [facility]) for support with managing money and budgeting and also has caseworker support through [facility]. Discussed [place] with patient today and sent her the website. Advised that patient can search there for apartments in her budget. CSW to support.  Review of patient status, including review of consultants reports, relevant laboratory and other test results, and collaboration with appropriate care team members and the patient's provider was performed as part of comprehensive patient evaluation and provision of services.    Estanislado Emms, [facility] [phone]

## 2022-06-24 NOTE — Telephone Encounter (Signed)
Patient requesting refill of amlodipine and atorvastatin

## 2022-06-27 LAB — CYTOLOGY - PAP: Diagnosis: NEGATIVE

## 2022-06-27 NOTE — Progress Notes (Signed)
Normal cytology, repeat in 5 years

## 2022-06-28 ENCOUNTER — Other Ambulatory Visit: Payer: Medicaid Other

## 2022-06-28 DIAGNOSIS — Z130 Encounter for screening for diseases of the blood and blood-forming organs and certain disorders involving the immune mechanism: Secondary | ICD-10-CM

## 2022-06-28 DIAGNOSIS — Z1329 Encounter for screening for other suspected endocrine disorder: Secondary | ICD-10-CM

## 2022-06-28 DIAGNOSIS — E785 Hyperlipidemia, unspecified: Secondary | ICD-10-CM

## 2022-06-28 DIAGNOSIS — I10 Essential (primary) hypertension: Secondary | ICD-10-CM

## 2022-06-29 ENCOUNTER — Other Ambulatory Visit: Payer: Self-pay | Admitting: Nurse Practitioner

## 2022-06-29 DIAGNOSIS — E559 Vitamin D deficiency, unspecified: Secondary | ICD-10-CM

## 2022-06-29 DIAGNOSIS — E1165 Type 2 diabetes mellitus with hyperglycemia: Secondary | ICD-10-CM | POA: Insufficient documentation

## 2022-06-29 DIAGNOSIS — E1169 Type 2 diabetes mellitus with other specified complication: Secondary | ICD-10-CM

## 2022-06-29 LAB — CMP14+EGFR
ALT: 31 IU/L (ref 0–32)
AST: 23 IU/L (ref 0–40)
Albumin/Globulin Ratio: 1.5 (ref 1.2–2.2)
Albumin: 4.2 g/dL (ref 3.8–4.9)
Alkaline Phosphatase: 98 IU/L (ref 44–121)
BUN/Creatinine Ratio: 14 (ref 9–23)
BUN: 13 mg/dL (ref 6–24)
Bilirubin Total: 0.5 mg/dL (ref 0.0–1.2)
CO2: 22 mmol/L (ref 20–29)
Calcium: 9.6 mg/dL (ref 8.7–10.2)
Chloride: 100 mmol/L (ref 96–106)
Creatinine, Ser: 0.96 mg/dL (ref 0.57–1.00)
Globulin, Total: 2.8 g/dL (ref 1.5–4.5)
Glucose: 241 mg/dL — ABNORMAL HIGH (ref 70–99)
Potassium: 3.8 mmol/L (ref 3.5–5.2)
Sodium: 139 mmol/L (ref 134–144)
Total Protein: 7 g/dL (ref 6.0–8.5)
eGFR: 71 mL/min/{1.73_m2} (ref >59)

## 2022-06-29 LAB — CBC WITH DIFFERENTIAL/PLATELET
Basophils Absolute: 0.1 10*3/uL (ref 0.0–0.2)
Basos: 1 %
EOS (ABSOLUTE): 0.2 10*3/uL (ref 0.0–0.4)
Eos: 2 %
Hematocrit: 39.7 % (ref 34.0–46.6)
Hemoglobin: 13.6 g/dL (ref 11.1–15.9)
Immature Grans (Abs): 0 10*3/uL (ref 0.0–0.1)
Immature Granulocytes: 0 %
Lymphocytes Absolute: 2.8 10*3/uL (ref 0.7–3.1)
Lymphs: 37 %
MCH: 30.3 pg (ref 26.6–33.0)
MCHC: 34.3 g/dL (ref 31.5–35.7)
MCV: 88 fL (ref 79–97)
Monocytes Absolute: 0.3 10*3/uL (ref 0.1–0.9)
Monocytes: 5 %
Neutrophils Absolute: 4.1 10*3/uL (ref 1.4–7.0)
Neutrophils: 55 %
Platelets: 263 10*3/uL (ref 150–450)
RBC: 4.49 x10E6/uL (ref 3.77–5.28)
RDW: 13.2 % (ref 11.7–15.4)
WBC: 7.5 10*3/uL (ref 3.4–10.8)

## 2022-06-29 LAB — LIPID PANEL
Chol/HDL Ratio: 5 ratio — ABNORMAL HIGH (ref 0.0–4.4)
Cholesterol, Total: 289 mg/dL — ABNORMAL HIGH (ref 100–199)
HDL: 58 mg/dL (ref >39)
LDL Chol Calc (NIH): 191 mg/dL — ABNORMAL HIGH (ref 0–99)
Triglycerides: 213 mg/dL — ABNORMAL HIGH (ref 0–149)
VLDL Cholesterol Cal: 40 mg/dL (ref 5–40)

## 2022-06-29 LAB — TSH: TSH: 0.97 u[IU]/mL (ref 0.450–4.500)

## 2022-06-29 LAB — VITAMIN D 25 HYDROXY (VIT D DEFICIENCY, FRACTURES): Vit D, 25-Hydroxy: 26 ng/mL — ABNORMAL LOW (ref 30.0–100.0)

## 2022-06-29 LAB — HEMOGLOBIN A1C
Est. average glucose Bld gHb Est-mCnc: 171 mg/dL
Hgb A1c MFr Bld: 7.6 % — ABNORMAL HIGH (ref 4.8–5.6)

## 2022-06-29 MED ORDER — METFORMIN HCL ER 500 MG PO TB24: 500.0000 mg | ORAL_TABLET | Freq: Two times a day (BID) | ORAL | 3 refills | Status: DC

## 2022-06-29 MED ORDER — ATORVASTATIN CALCIUM 40 MG PO TABS: 40.0000 mg | ORAL_TABLET | Freq: Every day | ORAL | 6 refills | Status: DC

## 2022-06-29 NOTE — Progress Notes (Signed)
Uncontrolled Type 2 diabetes. Start taking metformin 500mg  BID avoid sugar sweets soda.  Hyperlipidemia , LDL goal is less than 70, start taking atorvastatin 40mg  daily. Avoid fatty fried foods.  Take vitamin D 1000 units daily for vitamin D deficiency, get early morning sunshine. Foods rich in vitamin D include Cod liver oil,Salmon,Swordfish,Tuna fish,,Dairy and plant milks fortified with vitamin D,Sardines,Beef liver   Follow up in the office in 4 weeks for her type 2 DM , please schedule this appointment .  Other labs are normal

## 2022-06-30 ENCOUNTER — Ambulatory Visit: Payer: Medicaid Other

## 2022-07-09 LAB — COLOGUARD: COLOGUARD: NEGATIVE

## 2022-07-11 NOTE — Progress Notes (Signed)
Normal Cologuard, repeat in 3 years

## 2022-07-14 ENCOUNTER — Ambulatory Visit: Payer: Medicare Other | Admitting: Clinical

## 2022-07-14 DIAGNOSIS — F200 Paranoid schizophrenia: Secondary | ICD-10-CM

## 2022-07-14 DIAGNOSIS — F419 Anxiety disorder, unspecified: Secondary | ICD-10-CM

## 2022-07-14 DIAGNOSIS — F4310 Post-traumatic stress disorder, unspecified: Secondary | ICD-10-CM

## 2022-07-14 DIAGNOSIS — F332 Major depressive disorder, recurrent severe without psychotic features: Secondary | ICD-10-CM

## 2022-07-14 NOTE — Progress Notes (Signed)
Integrated Behavioral Health Progress Note  07/14/2022 Name: Theresa Olson MRN: [id number] DOB: 06/03/1970 Theresa Olson is a 52 y.o. year old female who sees Paseda, Dewaine Conger, FNP for primary care. LCSW was initially consulted to assist patient with mental health concerns.   Interpreter: No.   Interpreter Name & Language: none  Assessment: Patient experiencing mental health concerns. She is also in need of community resources. She was recently approved for social security disability.   Ongoing Intervention: Today CSW called patient and discussed progress on looking for a new apartment. Patient reported she had applied for an apartment, though the rent was nearly the total of her social security income. Advised patient on applying for apartments with more affordable rent. Will mail patient housing listing from [place] and will plan to follow up with patient by phone next week.   Review of patient status, including review of consultants reports, relevant laboratory and other test results, and collaboration with appropriate care team members and the patient's provider was performed as part of comprehensive patient evaluation and provision of services.    Estanislado Emms, [facility] [phone]

## 2022-07-22 ENCOUNTER — Ambulatory Visit (INDEPENDENT_AMBULATORY_CARE_PROVIDER_SITE_OTHER): Payer: Medicare Other | Admitting: Clinical

## 2022-07-22 DIAGNOSIS — F419 Anxiety disorder, unspecified: Secondary | ICD-10-CM

## 2022-07-22 DIAGNOSIS — F4310 Post-traumatic stress disorder, unspecified: Secondary | ICD-10-CM

## 2022-07-22 DIAGNOSIS — F332 Major depressive disorder, recurrent severe without psychotic features: Secondary | ICD-10-CM

## 2022-07-22 DIAGNOSIS — F200 Paranoid schizophrenia: Secondary | ICD-10-CM
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
## 2022-07-29 ENCOUNTER — Ambulatory Visit (INDEPENDENT_AMBULATORY_CARE_PROVIDER_SITE_OTHER): Payer: Medicare Other | Admitting: Clinical

## 2022-07-29 DIAGNOSIS — F200 Paranoid schizophrenia: Secondary | ICD-10-CM

## 2022-07-29 DIAGNOSIS — F4310 Post-traumatic stress disorder, unspecified: Secondary | ICD-10-CM

## 2022-07-29 DIAGNOSIS — F332 Major depressive disorder, recurrent severe without psychotic features: Secondary | ICD-10-CM

## 2022-07-29 DIAGNOSIS — F419 Anxiety disorder, unspecified: Secondary | ICD-10-CM

## 2022-07-29 NOTE — Progress Notes (Signed)
Integrated Behavioral Health Progress Note  07/29/2022 Name: Katharyn Quincey MRN: [id number] DOB: 1971/04/13 Jordie Bumann is a 52 y.o. year old female who sees Paseda, Dewaine Conger, FNP for primary care. LCSW was initially consulted to assist patient with mental health concerns.   Interpreter: No.   Interpreter Name & Language: none  Assessment: Patient experiencing mental health concerns. She is also in need of community resources. She was recently approved for social security disability.   Ongoing Intervention: Called patient today and checked in. Patient did receive the list of possible housing CSW mailed from [employer] [facility] [employer]. Called a few of the landlords today and left some voice mails. One Secondary school teacher indicated their wait list is 3-4 years long, but patient can still go in and apply. CSW to continue to follow.  Review of patient status, including review of consultants reports, relevant laboratory and other test results, and collaboration with appropriate care team members and the patient's provider was performed as part of comprehensive patient evaluation and provision of services.    Estanislado Emms, [facility] [phone]

## 2022-08-02 ENCOUNTER — Other Ambulatory Visit: Payer: Self-pay | Admitting: Nurse Practitioner

## 2022-08-02 DIAGNOSIS — E1165 Type 2 diabetes mellitus with hyperglycemia: Secondary | ICD-10-CM

## 2022-08-05 ENCOUNTER — Ambulatory Visit (INDEPENDENT_AMBULATORY_CARE_PROVIDER_SITE_OTHER): Payer: Medicare Other | Admitting: Clinical

## 2022-08-05 DIAGNOSIS — F200 Paranoid schizophrenia: Secondary | ICD-10-CM

## 2022-08-05 DIAGNOSIS — F4310 Post-traumatic stress disorder, unspecified: Secondary | ICD-10-CM

## 2022-08-05 DIAGNOSIS — F419 Anxiety disorder, unspecified: Secondary | ICD-10-CM

## 2022-08-05 DIAGNOSIS — F332 Major depressive disorder, recurrent severe without psychotic features: Secondary | ICD-10-CM
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
## 2022-08-17 ENCOUNTER — Ambulatory Visit: Payer: Medicaid Other

## 2022-08-19 ENCOUNTER — Ambulatory Visit (INDEPENDENT_AMBULATORY_CARE_PROVIDER_SITE_OTHER): Payer: Medicare Other | Admitting: Clinical

## 2022-08-19 DIAGNOSIS — F419 Anxiety disorder, unspecified: Secondary | ICD-10-CM

## 2022-08-19 DIAGNOSIS — F200 Paranoid schizophrenia: Secondary | ICD-10-CM

## 2022-08-19 DIAGNOSIS — F4310 Post-traumatic stress disorder, unspecified: Secondary | ICD-10-CM

## 2022-08-19 DIAGNOSIS — F332 Major depressive disorder, recurrent severe without psychotic features: Secondary | ICD-10-CM

## 2022-08-19 NOTE — Progress Notes (Signed)
Integrated Behavioral Health Progress Note  08/19/2022 Name: Theresa Olson MRN: [id number] DOB: [birth year] Theresa Olson is a 52 y.o. year old female who sees Paseda, Dewaine Conger, FNP for primary care. LCSW was initially consulted to assist patient with mental health concerns.   Interpreter: No.   Interpreter Name & Language: none  Assessment: Patient experiencing mental health concerns. She is also in need of community resources. She was recently approved for social security disability.   Ongoing Intervention: Brief check in with patient on the phone today. She has not made much progress with apartment hunting. Some brief supportive counseling provided around family conflict.  Estanislado Emms, [facility] [phone]

## 2022-08-25 ENCOUNTER — Other Ambulatory Visit (HOSPITAL_COMMUNITY): Payer: Self-pay | Admitting: Psychiatry

## 2022-08-25 DIAGNOSIS — F200 Paranoid schizophrenia: Secondary | ICD-10-CM

## 2022-08-25 DIAGNOSIS — F4310 Post-traumatic stress disorder, unspecified: Secondary | ICD-10-CM

## 2022-09-02 ENCOUNTER — Telehealth: Payer: Medicare Other | Admitting: Clinical

## 2022-09-02 ENCOUNTER — Telehealth: Payer: Self-pay | Admitting: Clinical

## 2022-09-02 NOTE — Telephone Encounter (Signed)
Integrated Behavioral Health Progress Note  09/02/2022 Name: Jalice Bobel MRN: [id number] DOB: 1970-05-31 Adaria Hunsley is a 52 y.o. year old female who sees Paseda, Baird Kay, FNP for primary care. LCSW was initially consulted to assist patient with mental health concerns.   Interpreter: No.   Interpreter Name & Language: none  Assessment: Patient experiencing mental health concerns. She is also in need of community resources. She was recently approved for social security disability.   Ongoing Intervention: Brief check in with patient on the phone today. She has not made much progress with apartment hunting. Some brief supportive counseling provided around family conflict.  Abigail Butts, LCSW [facility] [phone]

## 2022-09-08 ENCOUNTER — Encounter (HOSPITAL_COMMUNITY): Payer: Self-pay | Admitting: Psychiatry

## 2022-09-08 ENCOUNTER — Telehealth (HOSPITAL_BASED_OUTPATIENT_CLINIC_OR_DEPARTMENT_OTHER): Payer: Medicare Other | Admitting: Psychiatry

## 2022-09-08 DIAGNOSIS — F419 Anxiety disorder, unspecified: Secondary | ICD-10-CM | POA: Diagnosis not present

## 2022-09-08 DIAGNOSIS — F200 Paranoid schizophrenia: Secondary | ICD-10-CM | POA: Diagnosis not present

## 2022-09-08 DIAGNOSIS — F4310 Post-traumatic stress disorder, unspecified: Secondary | ICD-10-CM | POA: Diagnosis not present

## 2022-09-08 MED ORDER — HYDROXYZINE HCL 50 MG PO TABS: 50.0000 mg | ORAL_TABLET | Freq: Two times a day (BID) | ORAL | 2 refills | Status: DC | PRN

## 2022-09-08 MED ORDER — ARIPIPRAZOLE 10 MG PO TABS: 10.0000 mg | ORAL_TABLET | Freq: Every day | ORAL | 2 refills | Status: DC

## 2022-09-08 MED ORDER — DULOXETINE HCL 60 MG PO CPEP: ORAL_CAPSULE | ORAL | 2 refills | Status: DC
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
## 2022-09-29 ENCOUNTER — Telehealth: Payer: Self-pay | Admitting: Clinical

## 2022-09-29 NOTE — Telephone Encounter (Signed)
Integrated Behavioral Health Progress Note  09/29/2022 Name: Theresa Olson MRN: [id number] DOB: June 14, 1970 Theresa Olson is a 52 y.o. year old female who sees Paseda, Baird Kay, FNP for primary care. LCSW was initially consulted to assist patient with mental health concerns.   Interpreter: No.   Interpreter Name & Language: none  Assessment: Patient in need of community resources. She was recently approved for social security disability.   Ongoing Intervention: Brief check in with patient on the phone today around continued housing search and challenges with family dynamics. Patient continues to live with her daughter. CSW available for support as needed.  Abigail Butts, LCSW [facility] [phone]

## 2022-09-30 ENCOUNTER — Ambulatory Visit
Admission: RE | Admit: 2022-09-30 | Discharge: 2022-09-30 | Disposition: A | Discharge status: Home / Self Care | Payer: Medicare Other | Source: Ambulatory Visit | Attending: Nurse Practitioner | Admitting: Nurse Practitioner

## 2022-09-30 DIAGNOSIS — Z1231 Encounter for screening mammogram for malignant neoplasm of breast: Secondary | ICD-10-CM

## 2022-10-03 ENCOUNTER — Other Ambulatory Visit: Payer: Self-pay | Admitting: Nurse Practitioner

## 2022-10-03 DIAGNOSIS — E785 Hyperlipidemia, unspecified: Secondary | ICD-10-CM

## 2022-10-03 DIAGNOSIS — E1169 Type 2 diabetes mellitus with other specified complication: Secondary | ICD-10-CM

## 2022-10-10 ENCOUNTER — Other Ambulatory Visit: Payer: Self-pay | Admitting: Nurse Practitioner

## 2022-10-10 DIAGNOSIS — R739 Hyperglycemia, unspecified: Secondary | ICD-10-CM

## 2022-10-26 ENCOUNTER — Other Ambulatory Visit (HOSPITAL_COMMUNITY): Payer: Self-pay | Admitting: Psychiatry

## 2022-10-26 DIAGNOSIS — F4310 Post-traumatic stress disorder, unspecified: Secondary | ICD-10-CM

## 2022-10-26 DIAGNOSIS — F200 Paranoid schizophrenia: Secondary | ICD-10-CM

## 2022-11-04 ENCOUNTER — Other Ambulatory Visit: Payer: Self-pay | Admitting: Nurse Practitioner

## 2022-11-04 DIAGNOSIS — E785 Hyperlipidemia, unspecified: Secondary | ICD-10-CM

## 2022-11-04 DIAGNOSIS — I160 Hypertensive urgency: Secondary | ICD-10-CM

## 2022-11-30 ENCOUNTER — Other Ambulatory Visit: Payer: Self-pay | Admitting: Nurse Practitioner

## 2022-11-30 DIAGNOSIS — E785 Hyperlipidemia, unspecified: Secondary | ICD-10-CM

## 2022-12-08 ENCOUNTER — Encounter (HOSPITAL_COMMUNITY): Payer: Self-pay

## 2022-12-08 ENCOUNTER — Telehealth (HOSPITAL_BASED_OUTPATIENT_CLINIC_OR_DEPARTMENT_OTHER): Payer: Self-pay | Admitting: Psychiatry

## 2022-12-08 DIAGNOSIS — Z91199 Patient's noncompliance with other medical treatment and regimen due to unspecified reason: Secondary | ICD-10-CM

## 2022-12-08 NOTE — Progress Notes (Signed)
Patient is no show. Voicemail is full and cannot leave message.

## 2022-12-16 ENCOUNTER — Ambulatory Visit: Payer: Self-pay | Admitting: Nurse Practitioner

## 2022-12-23 ENCOUNTER — Encounter: Payer: Self-pay | Admitting: Nurse Practitioner

## 2022-12-23 ENCOUNTER — Ambulatory Visit (INDEPENDENT_AMBULATORY_CARE_PROVIDER_SITE_OTHER): Payer: Medicare Other | Admitting: Nurse Practitioner

## 2022-12-23 VITALS — BP 119/83 | HR 85 | Temp 97.4°F | Ht 60.0 in | Wt 179.4 lb

## 2022-12-23 DIAGNOSIS — F333 Major depressive disorder, recurrent, severe with psychotic symptoms: Secondary | ICD-10-CM | POA: Diagnosis not present

## 2022-12-23 DIAGNOSIS — I10 Essential (primary) hypertension: Secondary | ICD-10-CM | POA: Diagnosis not present

## 2022-12-23 DIAGNOSIS — E669 Obesity, unspecified: Secondary | ICD-10-CM

## 2022-12-23 DIAGNOSIS — E1169 Type 2 diabetes mellitus with other specified complication: Secondary | ICD-10-CM | POA: Diagnosis not present

## 2022-12-23 DIAGNOSIS — F411 Generalized anxiety disorder: Secondary | ICD-10-CM

## 2022-12-23 DIAGNOSIS — E785 Hyperlipidemia, unspecified: Secondary | ICD-10-CM

## 2022-12-23 LAB — POCT GLYCOSYLATED HEMOGLOBIN (HGB A1C): Hemoglobin A1C: 8 % — AB (ref 4.0–5.6)

## 2022-12-23 MED ORDER — METFORMIN HCL ER 500 MG PO TB24: 1000.0000 mg | ORAL_TABLET | Freq: Two times a day (BID) | ORAL | 2 refills | Status: DC

## 2022-12-23 MED ORDER — BLOOD GLUCOSE MONITORING SUPPL DEVI
1.0000 | Freq: Three times a day (TID) | 0 refills | Status: DC
Start: 2022-12-23 — End: 2023-04-10

## 2022-12-23 MED ORDER — BLOOD GLUCOSE TEST VI STRP: 1.0000 | ORAL_STRIP | Freq: Three times a day (TID) | 0 refills | Status: AC

## 2022-12-23 MED ORDER — VALSARTAN 80 MG PO TABS: 80.0000 mg | ORAL_TABLET | Freq: Every day | ORAL | 0 refills | Status: DC

## 2022-12-23 MED ORDER — LANCET DEVICE MISC: 1.0000 | Freq: Three times a day (TID) | 0 refills | Status: AC

## 2022-12-23 MED ORDER — LANCETS MISC. MISC: 1.0000 | Freq: Three times a day (TID) | 0 refills | Status: AC

## 2022-12-23 NOTE — Assessment & Plan Note (Addendum)
Lab Results  Component Value Date   CHOL 289 (H) 06/28/2022   HDL 58 06/28/2022   LDLCALC 191 (H) 06/28/2022   TRIG 213 (H) 06/28/2022   CHOLHDL 5.0 (H) 06/28/2022   Lab Results  Component Value Date   HGBA1C 8.0 (A) 12/23/2022  Start metformin 1000 mg twice daily Patient counseled on low-carb modified diet Encouraged to engage in regular moderate to vigorous exercises at least 150 minutes weekly CBG goals discussed with the patient Diabetic foot exam completed Referred for diabetic eye exam and diabetes nutrition education Glucose meter ordered Signs and symptoms of hypoglycemia discussed   LDL goal is less than 70, she is not fasting today patient encouraged to come fasting for her next appointment in 2 weeks Continue atorvastatin 40 mg daily

## 2022-12-23 NOTE — Assessment & Plan Note (Signed)
Wt Readings from Last 3 Encounters:  12/23/22 179 lb 6.4 oz (81.4 kg)  09/08/22 181 lb (82.1 kg)  06/17/22 181 lb 6.4 oz (82.3 kg)   Body mass index is 35.04 kg/m.  Patient counseled on low-carb modified diet Encouraged to engage in regular moderate to vigorous exercise at least 150 minutes weekly

## 2022-12-23 NOTE — Progress Notes (Signed)
Established Patient Office Visit  Subjective:  Patient ID: Theresa Olson, female    DOB: 04-16-1971  Age: 52 y.o. MRN: [id number]  CC:  Chief Complaint  Patient presents with   Follow-up    HPI Theresa Olson is a 52 y.o. female   has a past medical history of Anxiety, Anxiety disorder, Carpal tunnel syndrome, Depression, Hot flashes, Hyperlipidemia (10/2019), Hypertension, Insomnia, Type 2 diabetes mellitus (HCC), and Vitamin D deficiency (12/2018).  Patient presents for follow-up for her chronic medical conditions  Uncontrolled type 2 diabetes.  Currently has metformin 500 mg twice daily daily ordered but stated that she has been taking 1 tablets daily.  She does not exercise, states that her diet can be better.  Takes atorvastatin 40 mg daily for hyperlipidemia  Depression.  She is followed by psychiatrist and therapist.  She missed her last psychiatrist visit and has also been missing her therapy appointment.  She is currently on Cymbalta 60 mg daily, Abilify 10 mg daily.  She denied SI, HI  Due for shingles vaccine patient encouraged to get the vaccine.       Past Medical History:  Diagnosis Date   Anxiety    Anxiety disorder    Carpal tunnel syndrome    Depression    Hot flashes    Hyperlipidemia 10/2019   Hypertension    Insomnia    Type 2 diabetes mellitus (HCC)    Vitamin D deficiency 12/2018    Past Surgical History:  Procedure Laterality Date   CESAREAN SECTION      Family History  Problem Relation Age of Onset   Hyperlipidemia Mother    Diabetes Mother    Hyperlipidemia Father    Diabetes Father    Heart disease Father    Diabetes Sister    Colon cancer Neg Hx    Breast cancer Neg Hx     Social History   Socioeconomic History   Marital status: Single    Spouse name: Not on file   Number of children: 5   Years of education: Not on file   Highest education level: Not on file  Occupational History   Not on file  Tobacco Use   Smoking  status: Former    Current packs/day: 0.00    Types: Cigarettes    Quit date: 08/28/2016    Years since quitting: 6.3   Smokeless tobacco: Never  Substance and Sexual Activity   Alcohol use: Not Currently    Comment: 2-3 glasses of vodka per day   Drug use: Yes    Types: Marijuana    Comment: smokes every few days   Sexual activity: Yes    Birth control/protection: None  Other Topics Concern   Not on file  Social History Narrative   Lives with her daughter and her husband.    Social Determinants of Health   Financial Resource Strain: Not on file  Food Insecurity: Not on file  Transportation Needs: Not on file  Physical Activity: Not on file  Stress: Not on file  Social Connections: Not on file  Intimate Partner Violence: Not on file    Outpatient Medications Prior to Visit  Medication Sig Dispense Refill   ARIPiprazole (ABILIFY) 10 MG tablet Take 1 tablet (10 mg total) by mouth daily. 30 tablet 2   atorvastatin (LIPITOR) 40 MG tablet TAKE 1 TABLET BY MOUTH EVERY DAY 90 tablet 2   DULoxetine (CYMBALTA) 60 MG capsule Take one capsule daily 30 capsule 2   hydrOXYzine (  ATARAX) 50 MG tablet Take 1 tablet (50 mg total) by mouth 2 (two) times daily as needed for anxiety. 60 tablet 2   amLODipine (NORVASC) 5 MG tablet TAKE 1 TABLET (5 MG TOTAL) BY MOUTH DAILY. 90 tablet 1   metFORMIN (GLUCOPHAGE-XR) 500 MG 24 hr tablet TAKE 1 TABLET BY MOUTH 2 TIMES DAILY WITH A MEAL. 180 tablet 1   GLUTATHIONE PO Take 1 tablet by mouth daily. (Patient not taking: Reported on 07/22/2021)     Methylsulfonylmethane (MSM) 1000 MG CAPS Take 1,000 mg by mouth daily. (Patient not taking: Reported on 07/22/2021)     vitamin C (ASCORBIC ACID) 500 MG tablet Take 500 mg by mouth every morning. (Patient not taking: Reported on 06/17/2022)     No facility-administered medications prior to visit.    Allergies  Allergen Reactions   Zoloft [Sertraline Hcl] Swelling and Hypertension    ROS Review of Systems   Constitutional:  Negative for activity change, appetite change, chills, fatigue and fever.  HENT:  Negative for congestion, dental problem, ear discharge, ear pain, hearing loss, rhinorrhea, sinus pressure, sinus pain, sneezing and sore throat.   Eyes:  Negative for pain, discharge, redness and itching.  Respiratory:  Negative for cough, chest tightness, shortness of breath and wheezing.   Cardiovascular:  Negative for chest pain, palpitations and leg swelling.  Gastrointestinal:  Negative for abdominal distention, abdominal pain, anal bleeding, blood in stool, constipation, diarrhea, nausea, rectal pain and vomiting.  Endocrine: Negative for cold intolerance, heat intolerance, polydipsia, polyphagia and polyuria.  Genitourinary:  Negative for difficulty urinating, dysuria, flank pain, frequency, hematuria, menstrual problem, pelvic pain and vaginal bleeding.  Musculoskeletal:  Negative for arthralgias, back pain, gait problem, joint swelling and myalgias.  Skin:  Negative for color change, pallor, rash and wound.  Allergic/Immunologic: Negative for environmental allergies, food allergies and immunocompromised state.  Neurological:  Negative for dizziness, tremors, facial asymmetry, weakness and headaches.  Hematological:  Negative for adenopathy. Does not bruise/bleed easily.  Psychiatric/Behavioral:  Negative for agitation, behavioral problems, confusion, decreased concentration, hallucinations, self-injury and suicidal ideas.       Objective:    Physical Exam Vitals and nursing note reviewed.  Constitutional:      General: She is not in acute distress.    Appearance: Normal appearance. She is obese. She is not ill-appearing, toxic-appearing or diaphoretic.  HENT:     Mouth/Throat:     Mouth: Mucous membranes are moist.     Pharynx: Oropharynx is clear. No oropharyngeal exudate or posterior oropharyngeal erythema.  Eyes:     General: No scleral icterus.       Right eye: No  discharge.        Left eye: No discharge.     Extraocular Movements: Extraocular movements intact.     Conjunctiva/sclera: Conjunctivae normal.  Cardiovascular:     Rate and Rhythm: Normal rate and regular rhythm.     Pulses: Normal pulses.     Heart sounds: Normal heart sounds. No murmur heard.    No friction rub. No gallop.  Pulmonary:     Effort: Pulmonary effort is normal. No respiratory distress.     Breath sounds: Normal breath sounds. No stridor. No wheezing, rhonchi or rales.  Chest:     Chest wall: No tenderness.  Abdominal:     General: There is no distension.     Palpations: Abdomen is soft.     Tenderness: There is no abdominal tenderness. There is no right CVA tenderness, left  CVA tenderness or guarding.  Musculoskeletal:        General: No swelling, tenderness, deformity or signs of injury.     Right lower leg: No edema.     Left lower leg: No edema.  Skin:    General: Skin is warm and dry.     Capillary Refill: Capillary refill takes less than 2 seconds.     Coloration: Skin is not jaundiced or pale.     Findings: No bruising, erythema or lesion.  Neurological:     Mental Status: She is alert and oriented to person, place, and time.     Motor: No weakness.     Coordination: Coordination normal.     Gait: Gait normal.  Psychiatric:        Mood and Affect: Mood normal.        Behavior: Behavior normal.        Thought Content: Thought content normal.        Judgment: Judgment normal.     BP 119/83   Pulse 85   Temp (!) 97.4 F (36.3 C)   Ht 5' (1.524 m)   Wt 179 lb 6.4 oz (81.4 kg)   LMP 04/18/2017   SpO2 98%   BMI 35.04 kg/m  Wt Readings from Last 3 Encounters:  12/23/22 179 lb 6.4 oz (81.4 kg)  09/08/22 181 lb (82.1 kg)  06/17/22 181 lb 6.4 oz (82.3 kg)    Lab Results  Component Value Date   TSH 0.970 06/28/2022   Lab Results  Component Value Date   WBC 7.5 06/28/2022   HGB 13.6 06/28/2022   HCT 39.7 06/28/2022   MCV 88 06/28/2022   PLT  263 06/28/2022   Lab Results  Component Value Date   NA 139 06/28/2022   K 3.8 06/28/2022   CO2 22 06/28/2022   GLUCOSE 241 (H) 06/28/2022   BUN 13 06/28/2022   CREATININE 0.96 06/28/2022   BILITOT 0.5 06/28/2022   ALKPHOS 98 06/28/2022   AST 23 06/28/2022   ALT 31 06/28/2022   PROT 7.0 06/28/2022   ALBUMIN 4.2 06/28/2022   CALCIUM 9.6 06/28/2022   ANIONGAP 10 03/28/2020   EGFR 71 06/28/2022   Lab Results  Component Value Date   CHOL 289 (H) 06/28/2022   Lab Results  Component Value Date   HDL 58 06/28/2022   Lab Results  Component Value Date   LDLCALC 191 (H) 06/28/2022   Lab Results  Component Value Date   TRIG 213 (H) 06/28/2022   Lab Results  Component Value Date   CHOLHDL 5.0 (H) 06/28/2022   Lab Results  Component Value Date   HGBA1C 8.0 (A) 12/23/2022      Assessment & Plan:   Problem List Items Addressed This Visit       Cardiovascular and Mediastinum   High blood pressure - Primary    BP Readings from Last 3 Encounters:  12/23/22 119/83  06/17/22 112/69  07/22/21 121/85   HTN Controlled .  On amlodipine 5 mg daily.  Not on ACE or ARB and has type 2 diabetes we will discontinue amlodipine and start valsartan 80 mg daily. Continue current medications. No changes in management. Discussed DASH diet and dietary sodium restrictions Continue to increase dietary efforts and exercise.  CMP today BMP in 2 weeks Follow-up in the office in 2 weeks      Relevant Medications   valsartan (DIOVAN) 80 MG tablet   Other Relevant Orders   CMP14+EGFR  Endocrine   Hyperlipidemia associated with type 2 diabetes mellitus (HCC)    Lab Results  Component Value Date   CHOL 289 (H) 06/28/2022   HDL 58 06/28/2022   LDLCALC 191 (H) 06/28/2022   TRIG 213 (H) 06/28/2022   CHOLHDL 5.0 (H) 06/28/2022   Lab Results  Component Value Date   HGBA1C 8.0 (A) 12/23/2022  Start metformin 1000 mg twice daily Patient counseled on low-carb modified  diet Encouraged to engage in regular moderate to vigorous exercises at least 150 minutes weekly CBG goals discussed with the patient Diabetic foot exam completed Referred for diabetic eye exam and diabetes nutrition education Glucose meter ordered Signs and symptoms of hypoglycemia discussed   LDL goal is less than 70, she is not fasting today patient encouraged to come fasting for her next appointment in 2 weeks Continue atorvastatin 40 mg daily      Relevant Medications   metFORMIN (GLUCOPHAGE-XR) 500 MG 24 hr tablet   valsartan (DIOVAN) 80 MG tablet   Blood Glucose Monitoring Suppl DEVI   Other Relevant Orders   POCT glycosylated hemoglobin (Hb A1C) (Completed)   Microalbumin / creatinine urine ratio   CMP14+EGFR   Ambulatory referral to Ophthalmology   Amb Referral to Nutrition and Diabetic Education     Other   ANXIETY DISORDER, GENERALIZED    Continue hydroxyzine 50 mg twice daily as needed Patient encouraged to maintain close follow-up with psychiatrist and therapist      Recurrent major depression-severe (HCC)    Continue Abilify 10 mg daily, Cymbalta 60 mg daily She missed her last appointment with therapist and psychiatrist Patient encouraged to call psychiatrist office and schedule an appointment as soon as possible, appointment scheduled with Darl Pikes for counseling today She denies SI, HI Encouraged to seek urgent care at again for behavioral health clinic if needed      Obesity (BMI 30-39.9)    Wt Readings from Last 3 Encounters:  12/23/22 179 lb 6.4 oz (81.4 kg)  09/08/22 181 lb (82.1 kg)  06/17/22 181 lb 6.4 oz (82.3 kg)   Body mass index is 35.04 kg/m.  Patient counseled on low-carb modified diet Encouraged to engage in regular moderate to vigorous exercise at least 150 minutes weekly      Relevant Medications   metFORMIN (GLUCOPHAGE-XR) 500 MG 24 hr tablet    Meds ordered this encounter  Medications   metFORMIN (GLUCOPHAGE-XR) 500 MG 24 hr tablet     Sig: Take 2 tablets (1,000 mg total) by mouth 2 (two) times daily with a meal.    Dispense:  180 tablet    Refill:  2   valsartan (DIOVAN) 80 MG tablet    Sig: Take 1 tablet (80 mg total) by mouth daily.    Dispense:  90 tablet    Refill:  0   Blood Glucose Monitoring Suppl DEVI    Sig: 1 each by Does not apply route in the morning, at noon, and at bedtime. May substitute to any manufacturer covered by patient's insurance.    Dispense:  1 each    Refill:  0   Glucose Blood (BLOOD GLUCOSE TEST STRIPS) STRP    Sig: 1 each by In Vitro route in the morning, at noon, and at bedtime. May substitute to any manufacturer covered by patient's insurance.    Dispense:  90 each    Refill:  0   Lancet Device MISC    Sig: 1 each by Does not apply route in the morning,  at noon, and at bedtime. May substitute to any manufacturer covered by patient's insurance.    Dispense:  1 each    Refill:  0   Lancets Misc. MISC    Sig: 1 each by Does not apply route in the morning, at noon, and at bedtime. May substitute to any manufacturer covered by patient's insurance.    Dispense:  100 each    Refill:  0    Follow-up: Return in about 2 weeks (around 01/06/2023) for F/U WITH SUSAN FOR COUNSELING, HTN, DM.    Donell Beers, FNP

## 2022-12-23 NOTE — Assessment & Plan Note (Signed)
BP Readings from Last 3 Encounters:  12/23/22 119/83  06/17/22 112/69  07/22/21 121/85   HTN Controlled .  On amlodipine 5 mg daily.  Not on ACE or ARB and has type 2 diabetes we will discontinue amlodipine and start valsartan 80 mg daily. Continue current medications. No changes in management. Discussed DASH diet and dietary sodium restrictions Continue to increase dietary efforts and exercise.  CMP today BMP in 2 weeks Follow-up in the office in 2 weeks

## 2022-12-23 NOTE — Assessment & Plan Note (Signed)
Continue hydroxyzine 50 mg twice daily as needed Patient encouraged to maintain close follow-up with psychiatrist and therapist

## 2022-12-23 NOTE — Assessment & Plan Note (Signed)
Continue Abilify 10 mg daily, Cymbalta 60 mg daily She missed her last appointment with therapist and psychiatrist Patient encouraged to call psychiatrist office and schedule an appointment as soon as possible, appointment scheduled with Darl Pikes for counseling today She denies SI, HI Encouraged to seek urgent care at again for behavioral health clinic if needed

## 2022-12-23 NOTE — Patient Instructions (Addendum)
Please stop taking amlodipine.  Start valsartan 80 mg daily for high blood pressure .  This medication will help protect your kidneys.  Also start taking metformin 1000 mg 2 times daily with meals.  I have referred to the nutritionist for diabetes education   Please follow-up in the office in 2 weeks, come fasting for your next appointment  Goal for fasting blood sugar ranges from 80 to 120 and 2 hours after any meal or at bedtime should be between 130 to 170.   Blood pressure goal is less than 130/80.  Please get your shingles vaccine at the pharmacy   1. Hypertension, unspecified type  - valsartan (DIOVAN) 80 MG tablet; Take 1 tablet (80 mg total) by mouth daily.  Dispense: 90 tablet; Refill: 0   . Uncontrolled type 2 diabetes mellitus with hyperglycemia (HCC)  - POCT glycosylated hemoglobin (Hb A1C) - Microalbumin / creatinine urine ratio - metFORMIN (GLUCOPHAGE-XR) 500 MG 24 hr tablet; Take 2 tablets (1,000 mg total) by mouth 2 (two) times daily with a meal.  Dispense: 180 tablet; Refill: 2 - Ambulatory referral to Ophthalmology    It is important that you exercise regularly at least 30 minutes 5 times a week as tolerated  Think about what you will eat, plan ahead. Choose " clean, green, fresh or frozen" over canned, processed or packaged foods which are more sugary, salty and fatty. 70 to 75% of food eaten should be vegetables and fruit. Three meals at set times with snacks allowed between meals, but they must be fruit or vegetables. Aim to eat over a 12 hour period , example 7 am to 7 pm, and STOP after  your last meal of the day. Drink water,generally about 64 ounces per day, no other drink is as healthy. Fruit juice is best enjoyed in a healthy way, by EATING the fruit.  Thanks for choosing [facility] we consider it a privelige to serve you.

## 2022-12-27 ENCOUNTER — Encounter (HOSPITAL_COMMUNITY): Payer: Self-pay | Admitting: Psychiatry

## 2022-12-27 ENCOUNTER — Telehealth (HOSPITAL_BASED_OUTPATIENT_CLINIC_OR_DEPARTMENT_OTHER): Payer: Medicare Other | Admitting: Psychiatry

## 2022-12-27 VITALS — Wt 179.0 lb

## 2022-12-27 DIAGNOSIS — F200 Paranoid schizophrenia: Secondary | ICD-10-CM

## 2022-12-27 DIAGNOSIS — F419 Anxiety disorder, unspecified: Secondary | ICD-10-CM | POA: Diagnosis not present

## 2022-12-27 DIAGNOSIS — F4310 Post-traumatic stress disorder, unspecified: Secondary | ICD-10-CM | POA: Diagnosis not present

## 2022-12-27 MED ORDER — HYDROXYZINE HCL 50 MG PO TABS
50.0000 mg | ORAL_TABLET | Freq: Two times a day (BID) | ORAL | 2 refills | Status: DC | PRN
Start: 2022-12-27 — End: 2023-02-07

## 2022-12-27 MED ORDER — ARIPIPRAZOLE 5 MG PO TABS
5.0000 mg | ORAL_TABLET | Freq: Every day | ORAL | 2 refills | Status: DC
Start: 2022-12-27 — End: 2023-02-07

## 2022-12-27 MED ORDER — DULOXETINE HCL 60 MG PO CPEP
ORAL_CAPSULE | ORAL | 2 refills | Status: DC
Start: 2022-12-27 — End: 2023-02-07
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
## 2022-12-30 NOTE — Telephone Encounter (Signed)
Pt called and had questions about diabetics medicaition

## 2023-01-04 ENCOUNTER — Ambulatory Visit (INDEPENDENT_AMBULATORY_CARE_PROVIDER_SITE_OTHER): Payer: 59 | Admitting: Clinical

## 2023-01-04 DIAGNOSIS — F4310 Post-traumatic stress disorder, unspecified: Secondary | ICD-10-CM

## 2023-01-04 DIAGNOSIS — F209 Schizophrenia, unspecified: Secondary | ICD-10-CM

## 2023-01-04 DIAGNOSIS — F32A Depression, unspecified: Secondary | ICD-10-CM

## 2023-01-04 DIAGNOSIS — F419 Anxiety disorder, unspecified: Secondary | ICD-10-CM

## 2023-01-04 DIAGNOSIS — F332 Major depressive disorder, recurrent severe without psychotic features: Secondary | ICD-10-CM

## 2023-01-04 DIAGNOSIS — F200 Paranoid schizophrenia: Secondary | ICD-10-CM

## 2023-01-04 NOTE — BH Specialist Note (Unsigned)
Integrated Behavioral Health via Telemedicine Visit  01/05/2023 Theresa Olson [id number]  Number of Integrated Behavioral Health Clinician visits: Additional Visit  Session Start time: 1306   Session End time: 1350  Total time in minutes: 44  Referring Provider: Edwin Dada, NP Patient/Family location: [address], [place] (son's home) [facility] Provider location: [facility] All persons participating in visit: patient, CSW Types of Service: Individual psychotherapy and Telephone visit  I connected with Theresa Olson via Telephone and verified that I am speaking with the correct person using two identifiers. Discussed confidentiality: Yes   I discussed the limitations of telemedicine and the availability of in person appointments.  Discussed there is a possibility of technology failure and discussed alternative modes of communication if that failure occurs.  I discussed that engaging in this telemedicine visit, they consent to the provision of behavioral healthcare and the services will be billed under their insurance.  Patient and/or legal guardian expressed understanding and consented to Telemedicine visit: Yes   Presenting Concerns: Patient and/or family reports the following symptoms/concerns: depression and anxiety Duration of problem: several years; Severity of problem: moderate  Patient and/or Family's Strengths/Protective Factors: Social connections, Concrete supports in place (healthy food, safe environments, etc.), and Sense of purpose  Goals Addressed: Patient will:  Reduce symptoms of: anxiety and depression   Increase knowledge and/or ability of: coping skills and self-management skills   Demonstrate ability to: Increase healthy adjustment to current life circumstances  Progress towards Goals: Ongoing  Interventions: Interventions utilized:  Supportive Counseling and Psychoeducation and/or Health Education Standardized Assessments  completed: Not Needed  Reassessment and supportive counseling with patient today. Patient was re-referred for counseling by her PCP. Patient reports she did move into her own apartment in the last few months, but then her daughter and daughter's family ended up moving in with her after they lost their apartment. Patient would like to apply for places with lower rent/income based rent. Her rent at this new apartment is quite high, taking up almost all of her social security income each month. She is only able to cover rent and utilities because her daughter contributes to the bills. CSW to assist in locating some more affordable housing that patient can apply for.   Patient reported she sometimes gets upset and anxious because her apartment is crowded with daughter and family living there. She is able to get some alone time in her bedroom and still tries to use coping skills like reading or doing breathing exercises. She reports the medications from her psychiatrist are still helpful and he decreased her Abilify dose recently.   Supportive counseling including emotional validation and reflective listening provided around patient's family dynamics. One of her sons is back in prison and she still has not been able to rekindle a relationship with her youngest daughter.   Patient and/or Family Response: Patient engaged in session.   Assessment: Patient currently experiencing anxiety, depression, schizophrenia, and PTSD.   Patient may benefit from supportive counseling to process thoughts and emotions as she deals with challenging interpersonal dynamics. She may also benefit from psycho-education on healthy habits as she adjusts to new diagnosis of diabetes.   Plan: Follow up with behavioral health clinician on: 01/17/23 Behavioral recommendations: healthy eating adjustments for diabetes Referral(s): Integrated [employer] (In Clinic)  I discussed the assessment and treatment plan with  the patient and/or parent/guardian. They were provided an opportunity to ask questions and all were answered. They agreed with the plan and demonstrated  an understanding of the instructions.   They were advised to call back or seek an in-person evaluation if the symptoms worsen or if the condition fails to improve as anticipated.  Theresa Butts, LCSW

## 2023-01-11 ENCOUNTER — Ambulatory Visit: Payer: Self-pay | Admitting: Nurse Practitioner

## 2023-01-12 ENCOUNTER — Ambulatory Visit (INDEPENDENT_AMBULATORY_CARE_PROVIDER_SITE_OTHER): Payer: 59

## 2023-01-12 VITALS — Ht 60.0 in | Wt 180.0 lb

## 2023-01-12 DIAGNOSIS — Z0000 Encounter for general adult medical examination without abnormal findings: Secondary | ICD-10-CM | POA: Diagnosis not present

## 2023-01-12 NOTE — Progress Notes (Signed)
 Because this visit was a virtual/telehealth visit,  certain criteria was not obtained, such a blood pressure, CBG if patient is a diabetic, and timed get up and go. Any medications not marked as "taking" was not mentioned during the medication reconciliation part of the visit. Any vitals not documented were not able to be obtained due to this being a telehealth visit. Vitals that have been documented are verbally provided by the patient.  Patient was unable to self-report a recent blood pressure reading due to a lack of equipment at home via telehealth.  Subjective:   Theresa Olson is a 52 y.o. female who presents for an Initial Medicare Annual Wellness Visit.  Visit Complete: Virtual  I connected with  [name] on 01/12/23 by a audio enabled telemedicine application and verified that I am speaking with the correct person using two identifiers.  Patient Location: Home  Provider Location: Home Office  I discussed the limitations of evaluation and management by telemedicine. The patient expressed understanding and agreed to proceed.  Patient Medicare AWV questionnaire was completed by the patient on na; I have confirmed that all information answered by patient is correct and no changes since this date.  Review of Systems     Cardiac Risk Factors include: advanced age (>73men, >33 women);diabetes mellitus;dyslipidemia;hypertension;obesity (BMI >30kg/m2);sedentary lifestyle     Objective:    Today's Vitals   01/12/23 1101  Weight: 180 lb (81.6 kg)  Height: 5' (1.524 m)   Body mass index is 35.15 kg/m.     01/12/2023   11:01 AM 03/28/2020   12:39 AM 03/16/2019    9:59 PM 12/23/2017   12:43 AM 12/18/2016    6:51 PM 12/05/2016    4:39 AM 11/17/2016    8:50 AM  Advanced Directives  Does Patient Have a Medical Advance Directive? No No No No No No No  Would patient like information on creating a medical advance directive? No - Patient declined   No - Patient declined  No -  Patient declined No - Patient declined    Current Medications (verified) Outpatient Encounter Medications as of 01/12/2023  Medication Sig   ARIPiprazole (ABILIFY) 5 MG tablet Take 1 tablet (5 mg total) by mouth daily.   atorvastatin (LIPITOR) 40 MG tablet TAKE 1 TABLET BY MOUTH EVERY DAY   Blood Glucose Monitoring Suppl DEVI 1 each by Does not apply route in the morning, at noon, and at bedtime. May substitute to any manufacturer covered by patient's insurance.   DULoxetine (CYMBALTA) 60 MG capsule Take one capsule daily   Glucose Blood (BLOOD GLUCOSE TEST STRIPS) STRP 1 each by In Vitro route in the morning, at noon, and at bedtime. May substitute to any manufacturer covered by patient's insurance.   hydrOXYzine (ATARAX) 50 MG tablet Take 1 tablet (50 mg total) by mouth 2 (two) times daily as needed for anxiety.   Lancet Device MISC 1 each by Does not apply route in the morning, at noon, and at bedtime. May substitute to any manufacturer covered by patient's insurance.   Lancets Misc. MISC 1 each by Does not apply route in the morning, at noon, and at bedtime. May substitute to any manufacturer covered by patient's insurance.   metFORMIN (GLUCOPHAGE-XR) 500 MG 24 hr tablet Take 2 tablets (1,000 mg total) by mouth 2 (two) times daily with a meal.   Methylsulfonylmethane (MSM) 1000 MG CAPS Take 1,000 mg by mouth daily.   valsartan (DIOVAN) 80 MG tablet Take 1 tablet (80 mg total)  by mouth daily.   vitamin C (ASCORBIC ACID) 500 MG tablet Take 500 mg by mouth every morning.   GLUTATHIONE PO Take 1 tablet by mouth daily. (Patient not taking: Reported on 07/22/2021)   No facility-administered encounter medications on file as of 01/12/2023.    Allergies (verified) Zoloft [sertraline hcl]   History: Past Medical History:  Diagnosis Date   Anxiety    Anxiety disorder    Carpal tunnel syndrome    Depression    Hot flashes    Hyperlipidemia 10/2019   Hypertension    Insomnia    Type 2  diabetes mellitus (HCC)    Vitamin D deficiency 12/2018   Past Surgical History:  Procedure Laterality Date   CESAREAN SECTION     Family History  Problem Relation Age of Onset   Hyperlipidemia Mother    Diabetes Mother    Hyperlipidemia Father    Diabetes Father    Heart disease Father    Diabetes Sister    Colon cancer Neg Hx    Breast cancer Neg Hx    Social History   Socioeconomic History   Marital status: Single    Spouse name: Not on file   Number of children: 5   Years of education: Not on file   Highest education level: Not on file  Occupational History   Not on file  Tobacco Use   Smoking status: Former    Current packs/day: 0.00    Types: Cigarettes    Quit date: 08/28/2016    Years since quitting: 6.3   Smokeless tobacco: Never  Substance and Sexual Activity   Alcohol use: Not Currently    Comment: 2-3 glasses of vodka per day   Drug use: Yes    Types: Marijuana    Comment: smokes every few days   Sexual activity: Yes    Birth control/protection: None  Other Topics Concern   Not on file  Social History Narrative   Lives with her daughter and her husband.    Social Determinants of Health   Financial Resource Strain: Low Risk  (01/12/2023)   Overall Financial Resource Strain (CARDIA)    Difficulty of Paying Living Expenses: Not hard at all  Food Insecurity: No Food Insecurity (01/12/2023)   Hunger Vital Sign    Worried About Running Out of Food in the Last Year: Never true    Ran Out of Food in the Last Year: Never true  Transportation Needs: No Transportation Needs (01/12/2023)   PRAPARE - Administrator, Civil Service (Medical): No    Lack of Transportation (Non-Medical): No  Physical Activity: Inactive (01/12/2023)   Exercise Vital Sign    Days of Exercise per Week: 0 days    Minutes of Exercise per Session: 0 min  Stress: Stress Concern Present (01/12/2023)   [employer] of Occupational Health - Occupational Stress Questionnaire     Feeling of Stress : Very much  Social Connections: Moderately Integrated (01/12/2023)   Social Connection and Isolation Panel [NHANES]    Frequency of Communication with Friends and Family: More than three times a week    Frequency of Social Gatherings with Friends and Family: More than three times a week    Attends Religious Services: More than 4 times per year    Active Member of [employer] or Organizations: Yes    Attends Engineer, structural: More than 4 times per year    Marital Status: Divorced    Tobacco Counseling Counseling given:  Yes   Clinical Intake:  Pre-visit preparation completed: Yes  Pain : No/denies pain     BMI - recorded: 35.15 Nutritional Status: BMI > 30  Obese Nutritional Risks: None Diabetes: Yes CBG done?: No (telehealth visit. unable to obtain cbg) Did pt. bring in CBG monitor from home?: No  How often do you need to have someone help you when you read instructions, pamphlets, or other written materials from your doctor or pharmacy?: 1 - Never  Interpreter Needed?: No  Information entered by ::  , CMA   Activities of Daily Living    01/12/2023   11:12 AM  In your present state of health, do you have any difficulty performing the following activities:  Hearing? 0  Vision? 0  Difficulty concentrating or making decisions? 0  Walking or climbing stairs? 0  Dressing or bathing? 0  Doing errands, shopping? 0  Preparing Food and eating ? N  Using the Toilet? N  In the past six months, have you accidently leaked urine? N  Do you have problems with loss of bowel control? N  Managing your Medications? N  Managing your Finances? N  Housekeeping or managing your Housekeeping? N    Patient Care Team: Donell Beers, FNP as PCP - General (Nurse Practitioner)  Indicate any recent Medical Services you may have received from other than [facility] providers in the past year (date may be approximate).     Assessment:   This is a  routine wellness examination for [place].  Hearing/Vision screen Hearing Screening - Comments:: Patient denies any hearing difficulties.   Vision Screening - Comments:: Patient states they wear reading glasses only.  She is utd with yearly eye exams by Dr. Dione Booze  Dietary issues and exercise activities discussed:     Goals Addressed               This Visit's Progress     Patient Stated (pt-stated)        "I'd like to work on getting my sugar level down for my diabetes"       Depression Screen    01/12/2023   11:05 AM 12/23/2022   11:39 AM 06/17/2022    1:36 PM 07/22/2021    3:21 PM 01/15/2021    9:32 AM 08/20/2020    1:50 PM 07/30/2020    1:45 PM  PHQ 2/9 Scores  PHQ - 2 Score 6 6 6  0 4    PHQ- 9 Score 18 21 17  8        Information is confidential and restricted. Go to Review Flowsheets to unlock data.    Fall Risk    01/12/2023   11:12 AM 12/23/2022   11:40 AM 12/23/2022   11:39 AM 07/22/2021    3:21 PM 01/15/2021    9:32 AM  Fall Risk   Falls in the past year? 0 0 0 0 0  Number falls in past yr: 0 0 0  0  Injury with Fall? 0 0   0  Risk for fall due to : No Fall Risks No Fall Risks No Fall Risks    Follow up Falls prevention discussed Falls evaluation completed Falls evaluation completed      MEDICARE RISK AT HOME:  Medicare Risk at Home - 01/12/23 1110     Any stairs in or around the home? No    If so, are there any without handrails? No    Home free of loose throw rugs in walkways, pet beds, electrical  cords, etc? Yes    Adequate lighting in your home to reduce risk of falls? Yes    Life alert? No    Use of a cane, walker or w/c? No    Grab bars in the bathroom? No    Shower chair or bench in shower? No    Elevated toilet seat or a handicapped toilet? No             TIMED UP AND GO:  Was the test performed? No    Cognitive Function:        01/12/2023   11:04 AM  6CIT Screen  What Year? 0 points  What month? 0 points  What time? 0 points   Count back from 20 0 points  Months in reverse 0 points  Repeat phrase 0 points  Total Score 0 points    Immunizations Immunization History  Administered Date(s) Administered   Influenza,inj,Quad PF,6+ Mos 02/17/2014, 06/10/2015, 03/06/2017, 04/11/2018, 06/17/2022   Tdap 03/01/2016    TDAP status: Up to date  Flu Vaccine status: Due, Education has been provided regarding the importance of this vaccine. Advised may receive this vaccine at local pharmacy or Health Dept. Aware to provide a copy of the vaccination record if obtained from local pharmacy or Health Dept. Verbalized acceptance and understanding.  Pneumococcal vaccine status: NOT AGE APPROPRIATE FOR THIS PATIENT   Covid-19 vaccine status: Information provided on how to obtain vaccines.   Qualifies for Shingles Vaccine? Yes   Zostavax completed No   Shingrix Completed?: No.    Education has been provided regarding the importance of this vaccine. Patient has been advised to call insurance company to determine out of pocket expense if they have not yet received this vaccine. Advised may also receive vaccine at local pharmacy or Health Dept. Verbalized acceptance and understanding.  Screening Tests Health Maintenance  Topic Date Due   Medicare Annual Wellness (AWV)  Never done   OPHTHALMOLOGY EXAM  Never done   Zoster Vaccines- Shingrix (1 of 2) Never done   COVID-19 Vaccine (1 - 2023-24 season) Never done   INFLUENZA VACCINE  12/29/2022   HEMOGLOBIN A1C  06/25/2023   MAMMOGRAM  09/30/2023   Diabetic kidney evaluation - eGFR measurement  12/23/2023   Diabetic kidney evaluation - Urine ACR  12/23/2023   FOOT EXAM  12/23/2023   Fecal DNA (Cologuard)  06/25/2025   DTaP/Tdap/Td (2 - Td or Tdap) 03/01/2026   PAP SMEAR-Modifier  06/18/2027   Hepatitis C Screening  Completed   HIV Screening  Completed   HPV VACCINES  Aged Out    Health Maintenance  Health Maintenance Due  Topic Date Due   Medicare Annual Wellness  (AWV)  Never done   OPHTHALMOLOGY EXAM  Never done   Zoster Vaccines- Shingrix (1 of 2) Never done   COVID-19 Vaccine (1 - 2023-24 season) Never done   INFLUENZA VACCINE  12/29/2022    Colorectal cancer screening: Type of screening: Cologuard. Completed 06/25/2022. Repeat every 3 years  Mammogram status: Completed 09/30/2022. Repeat every year  Bone Density Screening: NOT AGE APPROPRIATE FOR THIS PATIENT  Lung Cancer Screening: (Low Dose CT Chest recommended if Age 12-80 years, 20 pack-year currently smoking OR have quit w/in 15years.) does not qualify.     Additional Screening:  Hepatitis C Screening: does not qualify; Completed 02/17/2014  Vision Screening: Recommended annual ophthalmology exams for early detection of glaucoma and other disorders of the eye. Is the patient up to date with their annual  eye exam?  Yes  Who is the provider or what is the name of the office in which the patient attends annual eye exams? [facility] If pt is not established with a provider, would they like to be referred to a provider to establish care? No .   Dental Screening: Recommended annual dental exams for proper oral hygiene  Diabetic Foot Exam: Diabetic Foot Exam: Completed 12/23/2022  Community Resource Referral / Chronic Care Management: CRR required this visit?  No   CCM required this visit?  No     Plan:     I have personally reviewed and noted the following in the patient's chart:   Medical and social history Use of alcohol, tobacco or illicit drugs  Current medications and supplements including opioid prescriptions. Patient is not currently taking opioid prescriptions. Functional ability and status Nutritional status Physical activity Advanced directives List of other physicians Hospitalizations, surgeries, and ER visits in previous 12 months Vitals Screenings to include cognitive, depression, and falls Referrals and appointments  In addition, I have reviewed and  discussed with patient certain preventive protocols, quality metrics, and best practice recommendations. A written personalized care plan for preventive services as well as general preventive health recommendations were provided to patient.     Jordan Hawks , CMA   01/12/2023   After Visit Summary: (Mail) Due to this being a telephonic visit, the after visit summary with patients personalized plan was offered to patient via mail   Nurse Notes:

## 2023-01-12 NOTE — Patient Instructions (Signed)
Theresa Olson , Thank you for taking time to come for your Medicare Wellness Visit. I appreciate your ongoing commitment to your health goals. Please review the following plan we discussed and let me know if I can assist you in the future.   These are the goals we discussed:  Goals       Patient Stated (pt-stated)      "I'd like to work on getting my sugar level down for my diabetes"        This is a list of the screening recommended for you and due dates:  Health Maintenance  Topic Date Due   Eye exam for diabetics  Never done   Zoster (Shingles) Vaccine (1 of 2) Never done   COVID-19 Vaccine (1 - 2023-24 season) Never done   Flu Shot  12/29/2022   Hemoglobin A1C  06/25/2023   Mammogram  09/30/2023   Yearly kidney function blood test for diabetes  12/23/2023   Yearly kidney health urinalysis for diabetes  12/23/2023   Complete foot exam   12/23/2023   Medicare Annual Wellness Visit  01/12/2024   Cologuard (Stool DNA test)  06/25/2025   DTaP/Tdap/Td vaccine (2 - Td or Tdap) 03/01/2026   Pap Smear  06/18/2027   Hepatitis C Screening  Completed   HIV Screening  Completed   HPV Vaccine  Aged Out    Advanced directives: Advance directive discussed with you today. Even though you declined this today, please call our office should you change your mind, and we can give you the proper paperwork for you to fill out. Advance care planning is a way to make decisions about medical care that fits your values in case you are ever unable to make these decisions for yourself.  Information on Advanced Care Planning can be found at [facility] Advance Health Care Directives Advance Health Care Directives ([email or web address]    Conditions/risks identified:  You are due for the vaccines checked below. You may have these done at your preferred pharmacy. Please have them fax the office proof of the vaccines so that we can update your chart.   [x]  Flu (due annually) [x]  Shingrix  (Shingles vaccine) []  Pneumonia Vaccines []  TDAP (Tetanus) Vaccine every 10 years [x]  Covid-19   Next appointment: VIRTUAL/TELEPHONE APPOINTMENT Follow up in one year for your annual wellness visit.  Your next Medicare Annual Wellness Visit appointment will be on January 18, 2024, 2025 at 9:00 am.This appointment will be a telephone call. Please make sure that you are available anywhere between 20 minutes before or after your scheduled appointment time depending on how the nurses schedule is running that day. Please have a list of your current medications ready to review and a list of any other providers you may be seeing. We look forward to speaking with you again next year.    Preventive Care 40-64 Years, Female Preventive care refers to lifestyle choices and visits with your health care provider that can promote health and wellness. What does preventive care include? A yearly physical exam. This is also called an annual well check. Dental exams once or twice a year. Routine eye exams. Ask your health care provider how often you should have your eyes checked. Personal lifestyle choices, including: Daily care of your teeth and gums. Regular physical activity. Eating a healthy diet. Avoiding tobacco and drug use. Limiting alcohol use. Practicing safe sex. Taking low-dose aspirin daily starting at age 67. Taking vitamin and mineral supplements as recommended by  your health care provider. What happens during an annual well check? The services and screenings done by your health care provider during your annual well check will depend on your age, overall health, lifestyle risk factors, and family history of disease. Counseling  Your health care provider may ask you questions about your: Alcohol use. Tobacco use. Drug use. Emotional well-being. Home and relationship well-being. Sexual activity. Eating habits. Work and work Astron[employer]. Method of birth control. Menstrual  cycle. Pregnancy history. Screening  You may have the following tests or measurements: Height, weight, and BMI. Blood pressure. Lipid and cholesterol levels. These may be checked every 5 years, or more frequently if you are over 55 years old. Skin check. Lung cancer screening. You may have this screening every year starting at age 66 if you have a 30-pack-year history of smoking and currently smoke or have quit within the past 15 years. Fecal occult blood test (FOBT) of the stool. You may have this test every year starting at age 21. Flexible sigmoidoscopy or colonoscopy. You may have a sigmoidoscopy every 5 years or a colonoscopy every 10 years starting at age 53. Hepatitis C blood test. Hepatitis B blood test. Sexually transmitted disease (STD) testing. Diabetes screening. This is done by checking your blood sugar (glucose) after you have not eaten for a while (fasting). You may have this done every 1-3 years. Mammogram. This may be done every 1-2 years. Talk to your health care provider about when you should start having regular mammograms. This may depend on whether you have a family history of breast cancer. BRCA-related cancer screening. This may be done if you have a family history of breast, ovarian, tubal, or peritoneal cancers. Pelvic exam and Pap test. This may be done every 3 years starting at age 16. Starting at age 75, this may be done every 5 years if you have a Pap test in combination with an HPV test. Bone density scan. This is done to screen for osteoporosis. You may have this scan if you are at high risk for osteoporosis. Discuss your test results, treatment options, and if necessary, the need for more tests with your health care provider. Vaccines  Your health care provider may recommend certain vaccines, such as: Influenza vaccine. This is recommended every year. Tetanus, diphtheria, and acellular pertussis (Tdap, Td) vaccine. You may need a Td booster every 10  years. Zoster vaccine. You may need this after age 32. Pneumococcal 13-valent conjugate (PCV13) vaccine. You may need this if you have certain conditions and were not previously vaccinated. Pneumococcal polysaccharide (PPSV23) vaccine. You may need one or two doses if you smoke cigarettes or if you have certain conditions. Talk to your health care provider about which screenings and vaccines you need and how often you need them. This information is not intended to replace advice given to you by your health care provider. Make sure you discuss any questions you have with your health care provider. Document Released: 06/12/2015 Document Revised: 02/03/2016 Document Reviewed: 03/17/2015 Elsevier Interactive Patient Education  2017 [employer].    Fall Prevention in the Home Falls can cause injuries. They can happen to people of all ages. There are many things you can do to make your home safe and to help prevent falls. What can I do on the outside of my home? Regularly fix the edges of walkways and driveways and fix any cracks. Remove anything that might make you trip as you walk through a door, such as a raised step  or threshold. Trim any bushes or trees on the path to your home. Use bright outdoor lighting. Clear any walking paths of anything that might make someone trip, such as rocks or tools. Regularly check to see if handrails are loose or broken. Make sure that both sides of any steps have handrails. Any raised decks and porches should have guardrails on the edges. Have any leaves, snow, or ice cleared regularly. Use sand or salt on walking paths during winter. Clean up any spills in your garage right away. This includes oil or grease spills. What can I do in the bathroom? Use night lights. Install grab bars by the toilet and in the tub and shower. Do not use towel bars as grab bars. Use non-skid mats or decals in the tub or shower. If you need to sit down in the shower, use a  plastic, non-slip stool. Keep the floor dry. Clean up any water that spills on the floor as soon as it happens. Remove soap buildup in the tub or shower regularly. Attach bath mats securely with double-sided non-slip rug tape. Do not have throw rugs and other things on the floor that can make you trip. What can I do in the bedroom? Use night lights. Make sure that you have a light by your bed that is easy to reach. Do not use any sheets or blankets that are too big for your bed. They should not hang down onto the floor. Have a firm chair that has side arms. You can use this for support while you get dressed. Do not have throw rugs and other things on the floor that can make you trip. What can I do in the kitchen? Clean up any spills right away. Avoid walking on wet floors. Keep items that you use a lot in easy-to-reach places. If you need to reach something above you, use a strong step stool that has a grab bar. Keep electrical cords out of the way. Do not use floor polish or wax that makes floors slippery. If you must use wax, use non-skid floor wax. Do not have throw rugs and other things on the floor that can make you trip. What can I do with my stairs? Do not leave any items on the stairs. Make sure that there are handrails on both sides of the stairs and use them. Fix handrails that are broken or loose. Make sure that handrails are as long as the stairways. Check any carpeting to make sure that it is firmly attached to the stairs. Fix any carpet that is loose or worn. Avoid having throw rugs at the top or bottom of the stairs. If you do have throw rugs, attach them to the floor with carpet tape. Make sure that you have a light switch at the top of the stairs and the bottom of the stairs. If you do not have them, ask someone to add them for you. What else can I do to help prevent falls? Wear shoes that: Do not have high heels. Have rubber bottoms. Are comfortable and fit you  well. Are closed at the toe. Do not wear sandals. If you use a stepladder: Make sure that it is fully opened. Do not climb a closed stepladder. Make sure that both sides of the stepladder are locked into place. Ask someone to hold it for you, if possible. Clearly mark and make sure that you can see: Any grab bars or handrails. First and last steps. Where the edge of each step  is. Use tools that help you move around (mobility aids) if they are needed. These include: Canes. Walkers. Scooters. Crutches. Turn on the lights when you go into a dark area. Replace any light bulbs as soon as they burn out. Set up your furniture so you have a clear path. Avoid moving your furniture around. If any of your floors are uneven, fix them. If there are any pets around you, be aware of where they are. Review your medicines with your doctor. Some medicines can make you feel dizzy. This can increase your chance of falling. Ask your doctor what other things that you can do to help prevent falls. This information is not intended to replace advice given to you by your health care provider. Make sure you discuss any questions you have with your health care provider. Document Released: 03/12/2009 Document Revised: 10/22/2015 Document Reviewed: 06/20/2014 Elsevier Interactive Patient Education  2017 [employer].

## 2023-01-13 ENCOUNTER — Ambulatory Visit: Payer: Self-pay | Admitting: Nurse Practitioner

## 2023-01-17 ENCOUNTER — Ambulatory Visit (INDEPENDENT_AMBULATORY_CARE_PROVIDER_SITE_OTHER): Payer: 59 | Admitting: Clinical

## 2023-01-17 DIAGNOSIS — F419 Anxiety disorder, unspecified: Secondary | ICD-10-CM

## 2023-01-17 DIAGNOSIS — F332 Major depressive disorder, recurrent severe without psychotic features: Secondary | ICD-10-CM

## 2023-01-17 DIAGNOSIS — F200 Paranoid schizophrenia: Secondary | ICD-10-CM

## 2023-01-17 DIAGNOSIS — F4310 Post-traumatic stress disorder, unspecified: Secondary | ICD-10-CM | POA: Diagnosis not present

## 2023-01-18 NOTE — BH Specialist Note (Signed)
Integrated Behavioral Health via Telemedicine Visit  01/18/2023 Theresa Olson [id number]  Number of Integrated Behavioral Health Clinician visits: Additional Visit  Session Start time: 1300   Session End time: 1320  Total time in minutes: 20  Referring Provider: Edwin Dada, NP Patient/Family location: [address]  [place] (home) [facility] Provider location: [facility] All persons participating in visit: patient, CSW Types of Service: Individual psychotherapy and Video visit  I connected with Theresa Olson via Video Enabled Telemedicine Application  (Video is Caregility application) and verified that I am speaking with the correct person using two identifiers. Discussed confidentiality: Yes   I discussed the limitations of telemedicine and the availability of in person appointments.  Discussed there is a possibility of technology failure and discussed alternative modes of communication if that failure occurs.  I discussed that engaging in this telemedicine visit, they consent to the provision of behavioral healthcare and the services will be billed under their insurance.  Patient and/or legal guardian expressed understanding and consented to Telemedicine visit: Yes   Presenting Concerns: Patient and/or family reports the following symptoms/concerns: depression and anxiety Duration of problem: several years; Severity of problem: moderate  Patient and/or Family's Strengths/Protective Factors: Social connections, Concrete supports in place (healthy food, safe environments, etc.), and Sense of purpose   Goals Addressed: Patient will:  Reduce symptoms of: anxiety and depression   Increase knowledge and/or ability of: coping skills and self-management skills   Demonstrate ability to: Increase healthy adjustment to current life circumstances  Progress towards Goals: Ongoing  Interventions: Interventions utilized:  Supportive Counseling and  Psychoeducation and/or Health Education Standardized Assessments completed: Not Needed  Supportive counseling today. Discussed patient applying for more affordable housing options. CSW to mail information from [employer]. Also provided continued psycho-education on diabetes and healthy eating. Patient reported she has been making changes to her diet. She has a nutrition and diabetes education appointment in November. Supportive counseling provided around family dynamics and living situation.  Patient and/or Family Response: Patient engaged in session.   Assessment: Patient currently experiencing anxiety, depression, schizophrenia, and PTSD. She reports and appears being well managed on psychiatric medications from her psychiatrist.   Patient may benefit from supportive counseling to process thoughts and emotions as she deals with challenging interpersonal dynamics. She may also benefit from psycho-education on healthy habits as she adjusts to new diagnosis of diabetes.   Plan: Follow up with behavioral health clinician on: 02/14/23  I discussed the assessment and treatment plan with the patient and/or parent/guardian. They were provided an opportunity to ask questions and all were answered. They agreed with the plan and demonstrated an understanding of the instructions.   They were advised to call back or seek an in-person evaluation if the symptoms worsen or if the condition fails to improve as anticipated.  Theresa Butts, LCSW

## 2023-01-21 ENCOUNTER — Other Ambulatory Visit (HOSPITAL_COMMUNITY): Payer: Self-pay | Admitting: Psychiatry

## 2023-01-21 DIAGNOSIS — F200 Paranoid schizophrenia: Secondary | ICD-10-CM

## 2023-01-21 DIAGNOSIS — F4310 Post-traumatic stress disorder, unspecified: Secondary | ICD-10-CM

## 2023-01-27 ENCOUNTER — Ambulatory Visit: Payer: Self-pay | Admitting: Nurse Practitioner

## 2023-01-31 ENCOUNTER — Ambulatory Visit: Payer: Self-pay | Admitting: Nurse Practitioner

## 2023-02-01 DIAGNOSIS — D3131 Benign neoplasm of right choroid: Secondary | ICD-10-CM | POA: Diagnosis not present

## 2023-02-01 DIAGNOSIS — H02051 Trichiasis without entropian right upper eyelid: Secondary | ICD-10-CM | POA: Diagnosis not present

## 2023-02-01 DIAGNOSIS — E119 Type 2 diabetes mellitus without complications: Secondary | ICD-10-CM | POA: Diagnosis not present

## 2023-02-01 DIAGNOSIS — H524 Presbyopia: Secondary | ICD-10-CM | POA: Diagnosis not present

## 2023-02-01 DIAGNOSIS — H04123 Dry eye syndrome of bilateral lacrimal glands: Secondary | ICD-10-CM | POA: Diagnosis not present

## 2023-02-01 LAB — HM DIABETES EYE EXAM

## 2023-02-03 ENCOUNTER — Other Ambulatory Visit: Payer: Self-pay | Admitting: Nurse Practitioner

## 2023-02-03 ENCOUNTER — Ambulatory Visit (INDEPENDENT_AMBULATORY_CARE_PROVIDER_SITE_OTHER): Payer: 59 | Admitting: Nurse Practitioner

## 2023-02-03 ENCOUNTER — Encounter: Payer: Self-pay | Admitting: Nurse Practitioner

## 2023-02-03 ENCOUNTER — Other Ambulatory Visit (HOSPITAL_COMMUNITY): Payer: Self-pay | Admitting: Psychiatry

## 2023-02-03 VITALS — BP 119/85 | HR 85 | Temp 97.2°F | Wt 171.0 lb

## 2023-02-03 DIAGNOSIS — E785 Hyperlipidemia, unspecified: Secondary | ICD-10-CM | POA: Diagnosis not present

## 2023-02-03 DIAGNOSIS — I10 Essential (primary) hypertension: Secondary | ICD-10-CM

## 2023-02-03 DIAGNOSIS — M25562 Pain in left knee: Secondary | ICD-10-CM | POA: Diagnosis not present

## 2023-02-03 DIAGNOSIS — F4310 Post-traumatic stress disorder, unspecified: Secondary | ICD-10-CM

## 2023-02-03 DIAGNOSIS — Z23 Encounter for immunization: Secondary | ICD-10-CM | POA: Diagnosis not present

## 2023-02-03 DIAGNOSIS — E1169 Type 2 diabetes mellitus with other specified complication: Secondary | ICD-10-CM

## 2023-02-03 DIAGNOSIS — F419 Anxiety disorder, unspecified: Secondary | ICD-10-CM

## 2023-02-03 DIAGNOSIS — G8929 Other chronic pain: Secondary | ICD-10-CM | POA: Diagnosis not present

## 2023-02-03 DIAGNOSIS — F200 Paranoid schizophrenia: Secondary | ICD-10-CM

## 2023-02-03 MED ORDER — VALSARTAN 80 MG PO TABS: 80.0000 mg | ORAL_TABLET | Freq: Every day | ORAL | 1 refills | Status: AC

## 2023-02-03 MED ORDER — ATORVASTATIN CALCIUM 40 MG PO TABS: 40.0000 mg | ORAL_TABLET | Freq: Every day | ORAL | 2 refills | Status: AC

## 2023-02-03 MED ORDER — KETOROLAC TROMETHAMINE 30 MG/ML IJ SOLN
30.0000 mg | Freq: Once | INTRAMUSCULAR | Status: AC
Start: 2023-02-03 — End: 2023-02-03
  Administered 2023-02-03: 30 mg via INTRAMUSCULAR

## 2023-02-03 MED ORDER — TIRZEPATIDE 2.5 MG/0.5ML ~~LOC~~ SOAJ: 2.5000 mg | SUBCUTANEOUS | 0 refills | Status: DC

## 2023-02-03 MED ORDER — TIRZEPATIDE 2.5 MG/0.5ML ~~LOC~~ SOAJ
2.5000 mg | SUBCUTANEOUS | 0 refills | Status: DC
Start: 2023-02-03 — End: 2023-02-03

## 2023-02-03 NOTE — Assessment & Plan Note (Signed)
Toradol 30 mg IM injection given in the office today Encouraged to take OTC Tylenol as needed Application of heat or ice encouraged

## 2023-02-03 NOTE — Patient Instructions (Signed)
You were given Toradol 30 mg injection in the office today for your knee pain.  Please take Tylenol 650 mg every 6 hours as needed.  Application of heat or ice is also helpful.  Encouraged you to do some stretching exercises   Please let me know you have any trouble getting the [facility].  We will continue metformin for now until you are able to get the prescription for [facility].  If you do not get Mounjaro we can try another pill that is different from metformin.  1. Hypertension, unspecified type   2. Need for influenza vaccination  - Flu vaccine trivalent PF, 6mos and older(Flulaval,Afluria,Fluarix,Fluzone)  3. Hyperlipidemia associated with type 2 diabetes mellitus (HCC)  - Lipid panel - Basic Metabolic Panel - tirzepatide (MOUNJARO) 2.5 MG/0.5ML Pen; Inject 2.5 mg into the skin once a week.  Dispense: 2 mL; Refill: 0    It is important that you exercise regularly at least 30 minutes 5 times a week as tolerated  Think about what you will eat, plan ahead. Choose " clean, green, fresh or frozen" over canned, processed or packaged foods which are more sugary, salty and fatty. 70 to 75% of food eaten should be vegetables and fruit. Three meals at set times with snacks allowed between meals, but they must be fruit or vegetables. Aim to eat over a 12 hour period , example 7 am to 7 pm, and STOP after  your last meal of the day. Drink water,generally about 64 ounces per day, no other drink is as healthy. Fruit juice is best enjoyed in a healthy way, by EATING the fruit.  Thanks for choosing [facility] we consider it a privelige to serve you.

## 2023-02-03 NOTE — Assessment & Plan Note (Signed)
Patient educated on CDC recommendation for the vaccine. Verbal consent was obtained from the patient, vaccine administered by nurse, no sign of adverse reactions noted at this time. Patient education on arm soreness and use of tylenol o for this patient  was discussed. Patient educated on the signs and symptoms of adverse effect and advise to contact the office if they occur.

## 2023-02-03 NOTE — Assessment & Plan Note (Addendum)
Start Mounjaro 2.5 mg daily, side effects of Mounjaro discussed Encouraged to eat smaller portion of meals avoid fried fatty foods to help decrease risk of nausea, patient told to report abdominal pain nausea vomiting Will start Mounjaro and stop metformin when she picks up prescription for Mounjaro. Checking lipid panel today LDL goal is less than 70 CBG goals discussed Patient counseled on low-carb modified diet Follow-up in 4 weeks - Lipid panel - Basic Metabolic Panel - atorvastatin (LIPITOR) 40 MG tablet; Take 1 tablet (40 mg total) by mouth daily.  Dispense: 90 tablet; Refill: 2 - valsartan (DIOVAN) 80 MG tablet; Take 1 tablet (80 mg total) by mouth daily.  Dispense: 90 tablet; Refill: 1 - tirzepatide (MOUNJARO) 2.5 MG/0.5ML Pen; Inject 2.5 mg into the skin once a week.  Dispense: 2 mL; Refill: 0

## 2023-02-03 NOTE — Progress Notes (Signed)
Established Patient Office Visit  Subjective:  Patient ID: Theresa Olson, female    DOB: 1970/09/06  Age: 52 y.o. MRN: [id number]  CC:  Chief Complaint  Patient presents with   Hypertension    HPI Theresa Olson is a 52 y.o. female  has a past medical history of Anxiety, Anxiety disorder, Carpal tunnel syndrome, Depression, Hot flashes, Hyperlipidemia (10/2019), Hypertension, Insomnia, Type 2 diabetes mellitus (HCC), and Vitamin D deficiency (12/2018).  Patient presents for follow-up for hypertension and diabetes  Hypertension.  Currently on valsartan 80 mg daily.  She denies chest pain dizziness edema  Uncontrolled type 2 diabetes.  Currently on metformin 1000 mg twice daily.  No she states that she would like to switch to [facility] because she experiences intermittent nausea vomiting diarrhea with metformin.  She denies history of pancreatitis no family history or personal history of medullary thyroid cancer..   Chronic left knee pain.  patient complains of chronic left knee pain.  Does not take any medication for her pain.  She reports stiffness in her knee joints that sometimes eases off after walking.  No numbness tingling fever chills    Past Medical History:  Diagnosis Date   Anxiety    Anxiety disorder    Carpal tunnel syndrome    Depression    Hot flashes    Hyperlipidemia 10/2019   Hypertension    Insomnia    Type 2 diabetes mellitus (HCC)    Vitamin D deficiency 12/2018    Past Surgical History:  Procedure Laterality Date   CESAREAN SECTION      Family History  Problem Relation Age of Onset   Hyperlipidemia Mother    Diabetes Mother    Hyperlipidemia Father    Diabetes Father    Heart disease Father    Diabetes Sister    Colon cancer Neg Hx    Breast cancer Neg Hx     Social History   Socioeconomic History   Marital status: Single    Spouse name: Not on file   Number of children: 5   Years of education: Not on file   Highest education level:  Not on file  Occupational History   Not on file  Tobacco Use   Smoking status: Former    Current packs/day: 0.00    Types: Cigarettes    Quit date: 08/28/2016    Years since quitting: 6.4   Smokeless tobacco: Never  Substance and Sexual Activity   Alcohol use: Not Currently    Comment: 2-3 glasses of vodka per day   Drug use: Yes    Types: Marijuana    Comment: smokes every few days   Sexual activity: Yes    Birth control/protection: None  Other Topics Concern   Not on file  Social History Narrative   Lives with her daughter and her husband.    Social Determinants of Health   Financial Resource Strain: Low Risk  (01/12/2023)   Overall Financial Resource Strain (CARDIA)    Difficulty of Paying Living Expenses: Not hard at all  Food Insecurity: No Food Insecurity (01/12/2023)   Hunger Vital Sign    Worried About Running Out of Food in the Last Year: Never true    Ran Out of Food in the Last Year: Never true  Transportation Needs: No Transportation Needs (01/12/2023)   PRAPARE - Administrator, Civil Service (Medical): No    Lack of Transportation (Non-Medical): No  Physical Activity: Inactive (01/12/2023)   Exercise Vital  Sign    Days of Exercise per Week: 0 days    Minutes of Exercise per Session: 0 min  Stress: Stress Concern Present (01/12/2023)   [employer] of Occupational Health - Occupational Stress Questionnaire    Feeling of Stress : Very much  Social Connections: Moderately Integrated (01/12/2023)   Social Connection and Isolation Panel [NHANES]    Frequency of Communication with Friends and Family: More than three times a week    Frequency of Social Gatherings with Friends and Family: More than three times a week    Attends Religious Services: More than 4 times per year    Active Member of [employer] or Organizations: Yes    Attends Engineer, structural: More than 4 times per year    Marital Status: Divorced  Intimate Partner Violence: Not At  Risk (01/12/2023)   Humiliation, Afraid, Rape, and Kick questionnaire    Fear of Current or Ex-Partner: No    Emotionally Abused: No    Physically Abused: No    Sexually Abused: No    Outpatient Medications Prior to Visit  Medication Sig Dispense Refill   ARIPiprazole (ABILIFY) 5 MG tablet Take 1 tablet (5 mg total) by mouth daily. 30 tablet 2   Blood Glucose Monitoring Suppl DEVI 1 each by Does not apply route in the morning, at noon, and at bedtime. May substitute to any manufacturer covered by patient's insurance. 1 each 0   DULoxetine (CYMBALTA) 60 MG capsule Take one capsule daily 30 capsule 2   hydrOXYzine (ATARAX) 50 MG tablet Take 1 tablet (50 mg total) by mouth 2 (two) times daily as needed for anxiety. 60 tablet 2   metFORMIN (GLUCOPHAGE-XR) 500 MG 24 hr tablet Take 2 tablets (1,000 mg total) by mouth 2 (two) times daily with a meal. 180 tablet 2   Methylsulfonylmethane (MSM) 1000 MG CAPS Take 1,000 mg by mouth daily.     vitamin C (ASCORBIC ACID) 500 MG tablet Take 500 mg by mouth every morning.     atorvastatin (LIPITOR) 40 MG tablet TAKE 1 TABLET BY MOUTH EVERY DAY 90 tablet 2   valsartan (DIOVAN) 80 MG tablet Take 1 tablet (80 mg total) by mouth daily. 90 tablet 0   GLUTATHIONE PO Take 1 tablet by mouth daily. (Patient not taking: Reported on 07/22/2021)     No facility-administered medications prior to visit.    Allergies  Allergen Reactions   Zoloft [Sertraline Hcl] Swelling and Hypertension    ROS Review of Systems  Constitutional:  Negative for activity change, appetite change, chills, fatigue and fever.  HENT:  Negative for congestion, dental problem, ear discharge, ear pain and hearing loss.   Respiratory:  Negative for cough, chest tightness, shortness of breath and wheezing.   Cardiovascular:  Negative for chest pain, palpitations and leg swelling.  Gastrointestinal:  Negative for abdominal distention, abdominal pain, anal bleeding, blood in stool, constipation,  diarrhea and nausea.  Genitourinary:  Negative for difficulty urinating, dysuria, flank pain, frequency, hematuria, menstrual problem, pelvic pain and vaginal bleeding.  Musculoskeletal:  Negative for arthralgias, back pain, gait problem, joint swelling and myalgias.  Skin:  Negative for color change, pallor, rash and wound.  Neurological:  Negative for dizziness, tremors, facial asymmetry, weakness and headaches.  Hematological:  Negative for adenopathy. Does not bruise/bleed easily.  Psychiatric/Behavioral:  Negative for agitation, behavioral problems, confusion, decreased concentration, hallucinations, self-injury and suicidal ideas.       Objective:    Physical Exam Vitals and nursing  note reviewed.  Constitutional:      General: She is not in acute distress.    Appearance: Normal appearance. She is obese. She is not ill-appearing, toxic-appearing or diaphoretic.  HENT:     Mouth/Throat:     Mouth: Mucous membranes are moist.     Pharynx: Oropharynx is clear. No oropharyngeal exudate or posterior oropharyngeal erythema.  Eyes:     General: No scleral icterus.       Right eye: No discharge.        Left eye: No discharge.     Extraocular Movements: Extraocular movements intact.     Conjunctiva/sclera: Conjunctivae normal.  Cardiovascular:     Rate and Rhythm: Normal rate and regular rhythm.     Pulses: Normal pulses.     Heart sounds: Normal heart sounds. No murmur heard.    No friction rub. No gallop.  Pulmonary:     Effort: Pulmonary effort is normal. No respiratory distress.     Breath sounds: Normal breath sounds. No stridor. No wheezing, rhonchi or rales.  Chest:     Chest wall: No tenderness.  Abdominal:     General: There is no distension.     Palpations: Abdomen is soft.     Tenderness: There is no abdominal tenderness. There is no right CVA tenderness, left CVA tenderness or guarding.  Musculoskeletal:        General: Tenderness present. No swelling, deformity or  signs of injury.     Right lower leg: No edema.     Left lower leg: No edema.     Comments: Tenderness on range of motion of left knee No swelling or redness noted skin warm and dry  Skin:    General: Skin is warm and dry.     Capillary Refill: Capillary refill takes less than 2 seconds.     Coloration: Skin is not jaundiced or pale.     Findings: No bruising, erythema or lesion.  Neurological:     Mental Status: She is alert and oriented to person, place, and time.     Motor: No weakness.     Coordination: Coordination normal.     Gait: Gait normal.  Psychiatric:        Mood and Affect: Mood normal.        Behavior: Behavior normal.        Thought Content: Thought content normal.        Judgment: Judgment normal.     BP 119/85   Pulse 85   Temp (!) 97.2 F (36.2 C)   Wt 171 lb (77.6 kg)   LMP 04/18/2017   SpO2 100%   BMI 33.40 kg/m  Wt Readings from Last 3 Encounters:  02/03/23 171 lb (77.6 kg)  01/12/23 180 lb (81.6 kg)  12/23/22 179 lb 6.4 oz (81.4 kg)    Lab Results  Component Value Date   TSH 0.970 06/28/2022   Lab Results  Component Value Date   WBC 7.5 06/28/2022   HGB 13.6 06/28/2022   HCT 39.7 06/28/2022   MCV 88 06/28/2022   PLT 263 06/28/2022   Lab Results  Component Value Date   NA 142 12/23/2022   K 3.9 12/23/2022   CO2 22 12/23/2022   GLUCOSE 287 (H) 12/23/2022   BUN 8 12/23/2022   CREATININE 0.83 12/23/2022   BILITOT 0.5 12/23/2022   ALKPHOS 121 12/23/2022   AST 30 12/23/2022   ALT 54 (H) 12/23/2022   PROT 7.6 12/23/2022  ALBUMIN 4.6 12/23/2022   CALCIUM 9.7 12/23/2022   ANIONGAP 10 03/28/2020   EGFR 84 12/23/2022   Lab Results  Component Value Date   CHOL 289 (H) 06/28/2022   Lab Results  Component Value Date   HDL 58 06/28/2022   Lab Results  Component Value Date   LDLCALC 191 (H) 06/28/2022   Lab Results  Component Value Date   TRIG 213 (H) 06/28/2022   Lab Results  Component Value Date   CHOLHDL 5.0 (H)  06/28/2022   Lab Results  Component Value Date   HGBA1C 8.0 (A) 12/23/2022      Assessment & Plan:   Problem List Items Addressed This Visit       Cardiovascular and Mediastinum   High blood pressure - Primary    BP Readings from Last 3 Encounters:  02/03/23 119/85  12/23/22 119/83  06/17/22 112/69   HTN Controlled .  On valsartan 80 mg daily Continue current medications. No changes in management. Discussed DASH diet and dietary sodium restrictions Continue to increase dietary efforts and exercise.  BMP today        Relevant Medications   atorvastatin (LIPITOR) 40 MG tablet   valsartan (DIOVAN) 80 MG tablet     Endocrine   Hyperlipidemia associated with type 2 diabetes mellitus (HCC)    Start Mounjaro 2.5 mg daily, side effects of Mounjaro discussed Encouraged to eat smaller portion of meals avoid fried fatty foods to help decrease risk of nausea, patient told to report abdominal pain nausea vomiting Will start Mounjaro and stop metformin when she picks up prescription for Mounjaro. Checking lipid panel today LDL goal is less than 70 CBG goals discussed Patient counseled on low-carb modified diet Follow-up in 4 weeks - Lipid panel - Basic Metabolic Panel - atorvastatin (LIPITOR) 40 MG tablet; Take 1 tablet (40 mg total) by mouth daily.  Dispense: 90 tablet; Refill: 2 - valsartan (DIOVAN) 80 MG tablet; Take 1 tablet (80 mg total) by mouth daily.  Dispense: 90 tablet; Refill: 1 - tirzepatide (MOUNJARO) 2.5 MG/0.5ML Pen; Inject 2.5 mg into the skin once a week.  Dispense: 2 mL; Refill: 0       Relevant Medications   atorvastatin (LIPITOR) 40 MG tablet   valsartan (DIOVAN) 80 MG tablet   tirzepatide (MOUNJARO) 2.5 MG/0.5ML Pen   Other Relevant Orders   Lipid panel   Basic Metabolic Panel     Other   Need for influenza vaccination    Patient educated on CDC recommendation for the vaccine. Verbal consent was obtained from the patient, vaccine administered by  nurse, no sign of adverse reactions noted at this time. Patient education on arm soreness and use of tylenol o) for this patient  was discussed. Patient educated on the signs and symptoms of adverse effect and advise to contact the office if they occur.       Relevant Orders   Flu vaccine trivalent PF, 6mos and older(Flulaval,Afluria,Fluarix,Fluzone) (Completed)   Chronic pain of left knee    Toradol 30 mg IM injection given in the office today Encouraged to take OTC Tylenol as needed Application of heat or ice encouraged        Meds ordered this encounter  Medications   DISCONTD: tirzepatide (MOUNJARO) 2.5 MG/0.5ML Pen    Sig: Inject 2.5 mg into the skin once a week.    Dispense:  2 mL    Refill:  0   ketorolac (TORADOL) 30 MG/ML injection 30 mg   atorvastatin (  LIPITOR) 40 MG tablet    Sig: Take 1 tablet (40 mg total) by mouth daily.    Dispense:  90 tablet    Refill:  2   valsartan (DIOVAN) 80 MG tablet    Sig: Take 1 tablet (80 mg total) by mouth daily.    Dispense:  90 tablet    Refill:  1   tirzepatide (MOUNJARO) 2.5 MG/0.5ML Pen    Sig: Inject 2.5 mg into the skin once a week.    Dispense:  2 mL    Refill:  0    Follow-up: Return in about 4 weeks (around 03/03/2023).    Donell Beers, FNP

## 2023-02-03 NOTE — Assessment & Plan Note (Signed)
BP Readings from Last 3 Encounters:  02/03/23 119/85  12/23/22 119/83  06/17/22 112/69   HTN Controlled .  On valsartan 80 mg daily Continue current medications. No changes in management. Discussed DASH diet and dietary sodium restrictions Continue to increase dietary efforts and exercise.  BMP today

## 2023-02-04 LAB — LIPID PANEL
Chol/HDL Ratio: 2.7 ratio (ref 0.0–4.4)
Cholesterol, Total: 113 mg/dL (ref 100–199)
HDL: 42 mg/dL (ref >39)
LDL Chol Calc (NIH): 54 mg/dL (ref 0–99)
Triglycerides: 84 mg/dL (ref 0–149)
VLDL Cholesterol Cal: 17 mg/dL (ref 5–40)

## 2023-02-04 LAB — BASIC METABOLIC PANEL
BUN/Creatinine Ratio: 12 (ref 9–23)
BUN: 10 mg/dL (ref 6–24)
CO2: 17 mmol/L — ABNORMAL LOW (ref 20–29)
Calcium: 9.9 mg/dL (ref 8.7–10.2)
Chloride: 105 mmol/L (ref 96–106)
Creatinine, Ser: 0.84 mg/dL (ref 0.57–1.00)
Glucose: 123 mg/dL — ABNORMAL HIGH (ref 70–99)
Potassium: 4.7 mmol/L (ref 3.5–5.2)
Sodium: 144 mmol/L (ref 134–144)
eGFR: 83 mL/min/{1.73_m2} (ref >59)

## 2023-02-06 ENCOUNTER — Ambulatory Visit: Payer: Self-pay | Admitting: Nurse Practitioner

## 2023-02-06 ENCOUNTER — Encounter: Payer: Self-pay | Admitting: *Deleted

## 2023-02-07 ENCOUNTER — Encounter (HOSPITAL_COMMUNITY): Payer: Self-pay | Admitting: Psychiatry

## 2023-02-07 ENCOUNTER — Telehealth (HOSPITAL_BASED_OUTPATIENT_CLINIC_OR_DEPARTMENT_OTHER): Payer: 59 | Admitting: Psychiatry

## 2023-02-07 VITALS — Wt 171.0 lb

## 2023-02-07 DIAGNOSIS — F419 Anxiety disorder, unspecified: Secondary | ICD-10-CM

## 2023-02-07 DIAGNOSIS — F200 Paranoid schizophrenia: Secondary | ICD-10-CM | POA: Diagnosis not present

## 2023-02-07 DIAGNOSIS — F4310 Post-traumatic stress disorder, unspecified: Secondary | ICD-10-CM

## 2023-02-07 MED ORDER — ARIPIPRAZOLE 5 MG PO TABS
5.0000 mg | ORAL_TABLET | Freq: Every day | ORAL | 2 refills | Status: AC
Start: 2023-02-07 — End: 2023-05-08

## 2023-02-07 MED ORDER — HYDROXYZINE HCL 50 MG PO TABS: 50.0000 mg | ORAL_TABLET | Freq: Two times a day (BID) | ORAL | 2 refills | Status: AC | PRN

## 2023-02-07 MED ORDER — DULOXETINE HCL 60 MG PO CPEP
ORAL_CAPSULE | ORAL | 2 refills | Status: AC
Start: 2023-02-07 — End: ?
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
## 2023-02-10 ENCOUNTER — Telehealth: Payer: Self-pay

## 2023-02-10 ENCOUNTER — Other Ambulatory Visit: Payer: Self-pay

## 2023-02-10 NOTE — Telephone Encounter (Signed)
Pharmacy Patient Advocate Encounter   Received notification from Physician's Office that prior authorization for [facility] is required/requested.   Insurance verification completed.   The patient is insured through  [facility] PART D  .   Per test claim: PA required; PA started via CoverMyMeds. KEY [id number] . Waiting for clinical questions to populate.

## 2023-02-13 ENCOUNTER — Other Ambulatory Visit: Payer: Self-pay

## 2023-02-13 ENCOUNTER — Telehealth: Payer: Self-pay

## 2023-02-13 ENCOUNTER — Encounter (INDEPENDENT_AMBULATORY_CARE_PROVIDER_SITE_OTHER): Payer: 59 | Admitting: Ophthalmology

## 2023-02-13 NOTE — Telephone Encounter (Signed)
Pharmacy Patient Advocate Encounter  Received notification from [facility] that Prior Authorization for [facility] has been APPROVED from 02/13/2023 to 05/30/2023   PA #/Case ID/Reference #: [id number]

## 2023-02-14 ENCOUNTER — Ambulatory Visit (INDEPENDENT_AMBULATORY_CARE_PROVIDER_SITE_OTHER): Payer: 59 | Admitting: Clinical

## 2023-02-14 DIAGNOSIS — F4310 Post-traumatic stress disorder, unspecified: Secondary | ICD-10-CM | POA: Diagnosis not present

## 2023-02-14 DIAGNOSIS — F200 Paranoid schizophrenia: Secondary | ICD-10-CM

## 2023-02-14 DIAGNOSIS — F419 Anxiety disorder, unspecified: Secondary | ICD-10-CM

## 2023-02-14 DIAGNOSIS — F332 Major depressive disorder, recurrent severe without psychotic features: Secondary | ICD-10-CM

## 2023-02-14 NOTE — BH Specialist Note (Unsigned)
Sister in town for [employer]  Dtr, husband, their kids still staying with her Looked over the housing info but hasn't applied for anything  Monjaro Diet going well Wants to add exercise [employer] scholarship app  Saw Dr. Lolly Mustache recently, everything ok with him and mental health meds

## 2023-02-25 ENCOUNTER — Other Ambulatory Visit: Payer: Self-pay | Admitting: Nurse Practitioner

## 2023-02-25 DIAGNOSIS — E1169 Type 2 diabetes mellitus with other specified complication: Secondary | ICD-10-CM

## 2023-03-06 ENCOUNTER — Ambulatory Visit: Payer: Self-pay | Admitting: Nurse Practitioner

## 2023-03-10 ENCOUNTER — Other Ambulatory Visit: Payer: Self-pay | Admitting: Nurse Practitioner

## 2023-03-10 DIAGNOSIS — E1169 Type 2 diabetes mellitus with other specified complication: Secondary | ICD-10-CM

## 2023-03-10 MED ORDER — MOUNJARO 5 MG/0.5ML ~~LOC~~ SOAJ: 5.0000 mg | SUBCUTANEOUS | 2 refills | Status: AC

## 2023-03-10 NOTE — Telephone Encounter (Signed)
Please advise Kh 

## 2023-03-14 ENCOUNTER — Encounter: Payer: 59 | Admitting: Clinical

## 2023-03-18 ENCOUNTER — Other Ambulatory Visit: Payer: Self-pay | Admitting: Nurse Practitioner

## 2023-03-18 DIAGNOSIS — E1169 Type 2 diabetes mellitus with other specified complication: Secondary | ICD-10-CM

## 2023-03-21 ENCOUNTER — Encounter: Payer: 59 | Admitting: Clinical

## 2023-03-24 ENCOUNTER — Ambulatory Visit: Payer: 59 | Admitting: Clinical

## 2023-03-24 ENCOUNTER — Ambulatory Visit: Payer: Self-pay | Admitting: Nurse Practitioner

## 2023-04-03 ENCOUNTER — Ambulatory Visit: Payer: Self-pay | Admitting: Nurse Practitioner

## 2023-04-10 ENCOUNTER — Ambulatory Visit (INDEPENDENT_AMBULATORY_CARE_PROVIDER_SITE_OTHER): Payer: 59 | Admitting: Nurse Practitioner

## 2023-04-10 ENCOUNTER — Encounter: Payer: Self-pay | Admitting: Nurse Practitioner

## 2023-04-10 VITALS — BP 120/81 | HR 106 | Temp 97.2°F | Wt 169.0 lb

## 2023-04-10 DIAGNOSIS — E669 Obesity, unspecified: Secondary | ICD-10-CM | POA: Diagnosis not present

## 2023-04-10 DIAGNOSIS — E1169 Type 2 diabetes mellitus with other specified complication: Secondary | ICD-10-CM | POA: Diagnosis not present

## 2023-04-10 DIAGNOSIS — E785 Hyperlipidemia, unspecified: Secondary | ICD-10-CM

## 2023-04-10 DIAGNOSIS — M25562 Pain in left knee: Secondary | ICD-10-CM

## 2023-04-10 DIAGNOSIS — I10 Essential (primary) hypertension: Secondary | ICD-10-CM

## 2023-04-10 DIAGNOSIS — G8929 Other chronic pain: Secondary | ICD-10-CM

## 2023-04-10 LAB — POCT GLYCOSYLATED HEMOGLOBIN (HGB A1C): Hemoglobin A1C: 6.1 % — AB (ref 4.0–5.6)

## 2023-04-10 MED ORDER — KETOROLAC TROMETHAMINE 30 MG/ML IJ SOLN
30.0000 mg | Freq: Once | INTRAMUSCULAR | Status: AC
  Administered 2023-04-10: 30 mg via INTRAMUSCULAR

## 2023-04-10 MED ORDER — BLOOD GLUCOSE MONITORING SUPPL DEVI: 1.0000 | Freq: Three times a day (TID) | 0 refills | Status: AC

## 2023-04-10 MED ORDER — IBUPROFEN 600 MG PO TABS: 600.0000 mg | ORAL_TABLET | Freq: Three times a day (TID) | ORAL | 0 refills | Status: AC | PRN

## 2023-04-10 NOTE — Assessment & Plan Note (Addendum)
Has aching pain 8/10.  Tramadol 30 mg IM injection given in the office today Start ibuprofen 600 mg 3 times daily as needed encouraged to take medication with food Alternate ibuprofen with Tylenol 650 mg every 6 hours as needed Stretching exercises encouraged Knee x-ray ordered

## 2023-04-10 NOTE — Assessment & Plan Note (Signed)
BP Readings from Last 3 Encounters:  04/10/23 120/81  02/03/23 119/85  12/23/22 119/83   HTN Controlled .  On valsartan 80 mg daily Continue current medications. No changes in management. Discussed DASH diet and dietary sodium restrictions Continue to increase dietary efforts and exercise.  BMP today Follow-up in 4 months

## 2023-04-10 NOTE — Progress Notes (Signed)
Established Patient Office Visit  Subjective:  Patient ID: Theresa Olson, female    DOB: [birth year]  Age: 52 y.o. MRN: [id number]  CC:  Chief Complaint  Patient presents with   Diabetes    HPI Theresa Olson is a 52 y.o. female  has a past medical history of Anxiety, Anxiety disorder, Carpal tunnel syndrome, Depression, Hot flashes, Hyperlipidemia (10/2019), Hypertension, Insomnia, Type 2 diabetes mellitus (HCC), and Vitamin D deficiency (12/2018).  Patient presents for follow-up for her chronic medical conditions  Type 2 diabetes.  Currently on Mounjaro 5 mg once weekly injection, stated she has been avoiding sugars and trying to follow a low-carb diet, stated that she has not been exercising.  No polyuria polyphagia polydipsia.  She reports random blood sugar readings of 114  Hypertension.  Currently on valsartan 80 mg daily.  No complaints of shortness of breath dizziness edema  Patient encouraged to get shingles vaccine at a pharmacy  Patient complains of chronic left knee pain worse with ambulation pain currently rated 8/10 with ambulation.  Requesting for Toradol shots today.  She has been taking an OTC supplement at home that helps  Patient denies any adverse reactions to current medication      Past Medical History:  Diagnosis Date   Anxiety    Anxiety disorder    Carpal tunnel syndrome    Depression    Hot flashes    Hyperlipidemia 10/2019   Hypertension    Insomnia    Type 2 diabetes mellitus (HCC)    Vitamin D deficiency 12/2018    Past Surgical History:  Procedure Laterality Date   CESAREAN SECTION      Family History  Problem Relation Age of Onset   Hyperlipidemia Mother    Diabetes Mother    Hyperlipidemia Father    Diabetes Father    Heart disease Father    Diabetes Sister    Colon cancer Neg Hx    Breast cancer Neg Hx     Social History   Socioeconomic History   Marital status: Single    Spouse name: Not on file   Number of  children: 5   Years of education: Not on file   Highest education level: Not on file  Occupational History   Not on file  Tobacco Use   Smoking status: Former    Current packs/day: 0.00    Types: Cigarettes    Quit date: 08/28/2016    Years since quitting: 6.6   Smokeless tobacco: Never  Substance and Sexual Activity   Alcohol use: Not Currently    Comment: 2-3 glasses of vodka per day   Drug use: Yes    Types: Marijuana    Comment: smokes every few days   Sexual activity: Yes    Birth control/protection: None  Other Topics Concern   Not on file  Social History Narrative   Lives with her daughter and her husband.    Social Determinants of Health   Financial Resource Strain: Low Risk  (01/12/2023)   Overall Financial Resource Strain (CARDIA)    Difficulty of Paying Living Expenses: Not hard at all  Food Insecurity: No Food Insecurity (01/12/2023)   Hunger Vital Sign    Worried About Running Out of Food in the Last Year: Never true    Ran Out of Food in the Last Year: Never true  Transportation Needs: No Transportation Needs (01/12/2023)   PRAPARE - Administrator, Civil Service (Medical): No    Lack  of Transportation (Non-Medical): No  Physical Activity: Inactive (01/12/2023)   Exercise Vital Sign    Days of Exercise per Week: 0 days    Minutes of Exercise per Session: 0 min  Stress: Stress Concern Present (01/12/2023)   [employer] of Occupational Health - Occupational Stress Questionnaire    Feeling of Stress : Very much  Social Connections: Moderately Integrated (01/12/2023)   Social Connection and Isolation Panel [NHANES]    Frequency of Communication with Friends and Family: More than three times a week    Frequency of Social Gatherings with Friends and Family: More than three times a week    Attends Religious Services: More than 4 times per year    Active Member of [employer] or Organizations: Yes    Attends Engineer, structural: More than 4 times  per year    Marital Status: Divorced  Intimate Partner Violence: Not At Risk (01/12/2023)   Humiliation, Afraid, Rape, and Kick questionnaire    Fear of Current or Ex-Partner: No    Emotionally Abused: No    Physically Abused: No    Sexually Abused: No    Outpatient Medications Prior to Visit  Medication Sig Dispense Refill   ARIPiprazole (ABILIFY) 5 MG tablet Take 1 tablet (5 mg total) by mouth daily. 30 tablet 2   atorvastatin (LIPITOR) 40 MG tablet Take 1 tablet (40 mg total) by mouth daily. 90 tablet 2   DULoxetine (CYMBALTA) 60 MG capsule Take one capsule daily 30 capsule 2   GLUTATHIONE PO Take 1 tablet by mouth daily.     hydrOXYzine (ATARAX) 50 MG tablet Take 1 tablet (50 mg total) by mouth 2 (two) times daily as needed for anxiety. 60 tablet 2   Methylsulfonylmethane (MSM) 1000 MG CAPS Take 1,000 mg by mouth daily.     tirzepatide [facility]) 5 MG/0.5ML Pen Inject 5 mg into the skin once a week. 2 mL 2   valsartan (DIOVAN) 80 MG tablet Take 1 tablet (80 mg total) by mouth daily. 90 tablet 1   vitamin C (ASCORBIC ACID) 500 MG tablet Take 500 mg by mouth every morning.     Blood Glucose Monitoring Suppl DEVI 1 each by Does not apply route in the morning, at noon, and at bedtime. May substitute to any manufacturer covered by patient's insurance. 1 each 0   metFORMIN (GLUCOPHAGE-XR) 500 MG 24 hr tablet TAKE 2 TABLETS (1,000 MG TOTAL) BY MOUTH 2 (TWO) TIMES DAILY WITH A MEAL. (Patient not taking: Reported on 04/10/2023) 360 tablet 1   No facility-administered medications prior to visit.    Allergies  Allergen Reactions   Zoloft [Sertraline Hcl] Swelling and Hypertension    ROS Review of Systems  Constitutional:  Negative for activity change, appetite change, chills, fatigue and fever.  HENT:  Negative for congestion, dental problem, ear discharge, ear pain and hearing loss.   Eyes:  Negative for pain, discharge, redness and itching.  Respiratory:  Negative for cough, chest  tightness, shortness of breath and wheezing.   Cardiovascular:  Negative for chest pain, palpitations and leg swelling.  Gastrointestinal:  Negative for abdominal distention, abdominal pain, anal bleeding and blood in stool.  Endocrine: Negative for polydipsia, polyphagia and polyuria.  Genitourinary:  Negative for difficulty urinating, dysuria, flank pain, frequency, hematuria, menstrual problem, pelvic pain and vaginal bleeding.  Musculoskeletal:  Positive for arthralgias. Negative for back pain, gait problem, joint swelling and myalgias.  Skin:  Negative for color change, pallor, rash and wound.  Neurological:  Negative for dizziness, tremors, facial asymmetry, weakness and headaches.  Hematological:  Negative for adenopathy. Does not bruise/bleed easily.  Psychiatric/Behavioral:  Negative for agitation, behavioral problems, confusion, decreased concentration, hallucinations, self-injury and suicidal ideas.       Objective:    Physical Exam Vitals and nursing note reviewed.  Constitutional:      General: She is not in acute distress.    Appearance: Normal appearance. She is obese. She is not ill-appearing, toxic-appearing or diaphoretic.  HENT:     Mouth/Throat:     Mouth: Mucous membranes are moist.     Pharynx: Oropharynx is clear. No oropharyngeal exudate or posterior oropharyngeal erythema.  Eyes:     General: No scleral icterus.       Right eye: No discharge.        Left eye: No discharge.     Extraocular Movements: Extraocular movements intact.     Conjunctiva/sclera: Conjunctivae normal.  Cardiovascular:     Rate and Rhythm: Normal rate and regular rhythm.     Pulses: Normal pulses.     Heart sounds: Normal heart sounds. No murmur heard.    No friction rub. No gallop.  Pulmonary:     Effort: Pulmonary effort is normal. No respiratory distress.     Breath sounds: Normal breath sounds. No stridor. No wheezing, rhonchi or rales.  Chest:     Chest wall: No tenderness.   Abdominal:     General: There is no distension.     Palpations: Abdomen is soft.     Tenderness: There is no abdominal tenderness. There is no right CVA tenderness, left CVA tenderness or guarding.  Musculoskeletal:        General: No swelling, tenderness, deformity or signs of injury.     Right lower leg: No edema.     Left lower leg: No edema.     Comments: Denied tenderness on range of motion of left knee  Skin:    General: Skin is warm and dry.     Capillary Refill: Capillary refill takes less than 2 seconds.     Coloration: Skin is not jaundiced or pale.     Findings: No bruising, erythema or lesion.  Neurological:     Mental Status: She is alert and oriented to person, place, and time.     Motor: No weakness.     Coordination: Coordination normal.     Gait: Gait normal.  Psychiatric:        Mood and Affect: Mood normal.        Behavior: Behavior normal.        Thought Content: Thought content normal.        Judgment: Judgment normal.     BP 120/81   Pulse (!) 106   Temp (!) 97.2 F (36.2 C)   Wt 169 lb (76.7 kg)   SpO2 99%   BMI 33.01 kg/m  Wt Readings from Last 3 Encounters:  04/10/23 169 lb (76.7 kg)  02/03/23 171 lb (77.6 kg)  01/12/23 180 lb (81.6 kg)    Lab Results  Component Value Date   TSH 0.970 06/28/2022   Lab Results  Component Value Date   WBC 7.5 06/28/2022   HGB 13.6 06/28/2022   HCT 39.7 06/28/2022   MCV 88 06/28/2022   PLT 263 06/28/2022   Lab Results  Component Value Date   NA 144 02/03/2023   K 4.7 02/03/2023   CO2 17 (L) 02/03/2023   GLUCOSE 123 (H) 02/03/2023  BUN 10 02/03/2023   CREATININE 0.84 02/03/2023   BILITOT 0.5 12/23/2022   ALKPHOS 121 12/23/2022   AST 30 12/23/2022   ALT 54 (H) 12/23/2022   PROT 7.6 12/23/2022   ALBUMIN 4.6 12/23/2022   CALCIUM 9.9 02/03/2023   ANIONGAP 10 03/28/2020   EGFR 83 02/03/2023   Lab Results  Component Value Date   CHOL 113 02/03/2023   Lab Results  Component Value Date   HDL  42 02/03/2023   Lab Results  Component Value Date   LDLCALC 54 02/03/2023   Lab Results  Component Value Date   TRIG 84 02/03/2023   Lab Results  Component Value Date   CHOLHDL 2.7 02/03/2023   Lab Results  Component Value Date   HGBA1C 6.1 (A) 04/10/2023      Assessment & Plan:   Problem List Items Addressed This Visit       Cardiovascular and Mediastinum   High blood pressure - Primary    BP Readings from Last 3 Encounters:  04/10/23 120/81  02/03/23 119/85  12/23/22 119/83   HTN Controlled .  On valsartan 80 mg daily Continue current medications. No changes in management. Discussed DASH diet and dietary sodium restrictions Continue to increase dietary efforts and exercise.  BMP today Follow-up in 4 months        Relevant Orders   Basic Metabolic Panel     Endocrine   Hyperlipidemia associated with type 2 diabetes mellitus (HCC)    A1c well-controlled at 6.1 Continue Mounjaro 5 mg once weekly injection Signs of hypoglycemia discussed CBG goals provided Patient counseled on low-carb modified diet Encouraged to engage in regular moderate to vigorous exercise at least 150 minutes weekly as tolerated Follow-up in 4 months Continue atorvastatin 40 mg daily for hyperlipidemia      Relevant Medications   Blood Glucose Monitoring Suppl DEVI   Other Relevant Orders   POCT glycosylated hemoglobin (Hb A1C) (Completed)     Other   Obesity (BMI 30-39.9)    Wt Readings from Last 3 Encounters:  04/10/23 169 lb (76.7 kg)  02/03/23 171 lb (77.6 kg)  01/12/23 180 lb (81.6 kg)   Body mass index is 33.01 kg/m.  Patient has lost 2 pounds since her last visit Currently on Mounjaro for type 2 diabetes Patient counseled on low-carb modified Encouraged to engage in regular moderate to vigorous exercise at least 150 minutes weekly as tolerated      Chronic pain of left knee    Has aching pain 8/10.  Tramadol 30 mg IM injection given in the office today Start  ibuprofen 600 mg 3 times daily as needed encouraged to take medication with food Alternate ibuprofen with Tylenol 650 mg every 6 hours as needed Stretching exercises encouraged Knee x-ray ordered      Relevant Medications   ibuprofen (ADVIL) 600 MG tablet   Other Relevant Orders   DG Knee Complete 4 Views Left    Meds ordered this encounter  Medications   ibuprofen (ADVIL) 600 MG tablet    Sig: Take 1 tablet (600 mg total) by mouth every 8 (eight) hours as needed.    Dispense:  30 tablet    Refill:  0   Blood Glucose Monitoring Suppl DEVI    Sig: 1 each by Does not apply route in the morning, at noon, and at bedtime. May substitute to any manufacturer covered by patient's insurance.    Dispense:  1 each    Refill:  0  Follow-up: Return in about 4 months (around 08/08/2023) for HTN, DM, HYPERLIPIDEMIA.    Donell Beers, FNP

## 2023-04-10 NOTE — Patient Instructions (Addendum)
1. Primary hypertension   2. Hyperlipidemia associated with type 2 diabetes mellitus (HCC)  - POCT glycosylated hemoglobin (Hb A1C) - Blood Glucose Monitoring Suppl DEVI; 1 each by Does not apply route in the morning, at noon, and at bedtime. May substitute to any manufacturer covered by patient's insurance.  Dispense: 1 each; Refill: 0  3. Chronic pain of left knee  - ibuprofen (ADVIL) 600 MG tablet; Take 1 tablet (600 mg total) by mouth every 8 (eight) hours as needed.  Dispense: 30 tablet; Refill: 0 - DG Knee Complete 4 Views Left; Future  4. Obesity (BMI 30-39.9)    You were given Toradol 30 mg intramuscular injection in the office today Please start taking ibuprofen 600 mg 3 times daily as needed from tomorrow, take medication with food to help prevent stomach ulcer and bleeding you can alternate ibuprofen with Tylenol 650 mg every 6 hours as needed.   Goal for fasting blood sugar ranges from 80 to 120 and 2 hours after any meal or at bedtime should be between 130 to 170.   Please get your shingles vaccine at the pharmacy    It is important that you exercise regularly at least 30 minutes 5 times a week as tolerated  Think about what you will eat, plan ahead. Choose " clean, green, fresh or frozen" over canned, processed or packaged foods which are more sugary, salty and fatty. 70 to 75% of food eaten should be vegetables and fruit. Three meals at set times with snacks allowed between meals, but they must be fruit or vegetables. Aim to eat over a 12 hour period , example 7 am to 7 pm, and STOP after  your last meal of the day. Drink water,generally about 64 ounces per day, no other drink is as healthy. Fruit juice is best enjoyed in a healthy way, by EATING the fruit.  Thanks for choosing [facility] we consider it a privelige to serve you.

## 2023-04-10 NOTE — Addendum Note (Signed)
Addended by: Merrilyn Puma on: 04/10/2023 02:28 PM   Modules accepted: Orders

## 2023-04-10 NOTE — Assessment & Plan Note (Signed)
A1c well-controlled at 6.1 Continue Mounjaro 5 mg once weekly injection Signs of hypoglycemia discussed CBG goals provided Patient counseled on low-carb modified diet Encouraged to engage in regular moderate to vigorous exercise at least 150 minutes weekly as tolerated Follow-up in 4 months Continue atorvastatin 40 mg daily for hyperlipidemia

## 2023-04-10 NOTE — Assessment & Plan Note (Addendum)
Wt Readings from Last 3 Encounters:  04/10/23 169 lb (76.7 kg)  02/03/23 171 lb (77.6 kg)  01/12/23 180 lb (81.6 kg)   Body mass index is 33.01 kg/m.  Patient has lost 2 pounds since her last visit Currently on Mounjaro for type 2 diabetes Patient counseled on low-carb modified Encouraged to engage in regular moderate to vigorous exercise at least 150 minutes weekly as tolerated

## 2023-04-11 LAB — BASIC METABOLIC PANEL
BUN/Creatinine Ratio: 14 (ref 9–23)
BUN: 12 mg/dL (ref 6–24)
CO2: 23 mmol/L (ref 20–29)
Calcium: 10.1 mg/dL (ref 8.7–10.2)
Chloride: 105 mmol/L (ref 96–106)
Creatinine, Ser: 0.83 mg/dL (ref 0.57–1.00)
Glucose: 135 mg/dL — ABNORMAL HIGH (ref 70–99)
Potassium: 3.7 mmol/L (ref 3.5–5.2)
Sodium: 144 mmol/L (ref 134–144)
eGFR: 84 mL/min/{1.73_m2} (ref >59)

## 2023-04-13 ENCOUNTER — Ambulatory Visit: Payer: Self-pay | Admitting: Dietician

## 2023-04-14 DIAGNOSIS — E782 Mixed hyperlipidemia: Secondary | ICD-10-CM | POA: Diagnosis not present

## 2023-04-14 DIAGNOSIS — E119 Type 2 diabetes mellitus without complications: Secondary | ICD-10-CM | POA: Diagnosis not present

## 2023-04-14 DIAGNOSIS — I10 Essential (primary) hypertension: Secondary | ICD-10-CM | POA: Diagnosis not present

## 2023-04-14 DIAGNOSIS — Z7689 Persons encountering health services in other specified circumstances: Secondary | ICD-10-CM | POA: Diagnosis not present

## 2023-04-17 ENCOUNTER — Ambulatory Visit: Payer: Self-pay | Admitting: Nurse Practitioner

## 2023-04-17 ENCOUNTER — Ambulatory Visit: Payer: 59 | Admitting: Clinical

## 2023-04-18 ENCOUNTER — Telehealth: Payer: Self-pay | Admitting: Clinical

## 2023-04-18 NOTE — Telephone Encounter (Signed)
[facility]ated [facility] Progress Note  04/18/2023 Name: Theresa Olson MRN: [id number] DOB: 03/28/1971 Theresa Olson is a 52 y.o. year old female who sees Paseda, Baird Kay, FNP for primary care. LCSW was initially consulted to assist patient with mental health concerns.   Interpreter: No.   Interpreter Name & Language: none  Assessment: Patient in need of community resources. She was recently approved for social security disability.   Ongoing Intervention: Patient missed scheduled appointment with CSW yesterday, 04/17/23. CSW called patient yesterday to check in; no answer and CSW left voice mail. Called patient again today and no answer; CSW left voice mail advising patient that CSW is leaving the practice at the end of the month.   Abigail Butts, LCSW [facility] [phone]

## 2023-04-29 DIAGNOSIS — I10 Essential (primary) hypertension: Secondary | ICD-10-CM | POA: Diagnosis not present

## 2023-05-02 ENCOUNTER — Other Ambulatory Visit (HOSPITAL_COMMUNITY): Payer: Self-pay | Admitting: Psychiatry

## 2023-05-02 DIAGNOSIS — F200 Paranoid schizophrenia: Secondary | ICD-10-CM

## 2023-05-02 DIAGNOSIS — F4310 Post-traumatic stress disorder, unspecified: Secondary | ICD-10-CM

## 2023-05-09 ENCOUNTER — Telehealth (HOSPITAL_COMMUNITY): Payer: 59 | Admitting: Psychiatry

## 2023-05-29 ENCOUNTER — Other Ambulatory Visit: Payer: Self-pay

## 2023-06-01 ENCOUNTER — Other Ambulatory Visit: Payer: Self-pay

## 2023-08-08 ENCOUNTER — Ambulatory Visit: Payer: Self-pay | Admitting: Nurse Practitioner

## 2023-08-30 ENCOUNTER — Ambulatory Visit: Payer: Self-pay | Admitting: Nurse Practitioner

## 2023-09-04 ENCOUNTER — Telehealth (HOSPITAL_COMMUNITY): Admitting: Psychiatry
